# Patient Record
Sex: Male | Born: 1935 | Race: White | Hispanic: No | Smoking: Former smoker
Health system: Southern US, Community
[De-identification: ages and names within clinical notes are randomized; demographics above are authoritative.]

## PROBLEM LIST (undated history)

## (undated) DIAGNOSIS — J8410 Pulmonary fibrosis, unspecified: Secondary | ICD-10-CM

## (undated) DIAGNOSIS — I219 Acute myocardial infarction, unspecified: Secondary | ICD-10-CM

## (undated) DIAGNOSIS — I255 Ischemic cardiomyopathy: Secondary | ICD-10-CM

## (undated) DIAGNOSIS — K625 Hemorrhage of anus and rectum: Secondary | ICD-10-CM

## (undated) DIAGNOSIS — I10 Essential (primary) hypertension: Secondary | ICD-10-CM

## (undated) DIAGNOSIS — I509 Heart failure, unspecified: Secondary | ICD-10-CM

## (undated) DIAGNOSIS — Z8673 Personal history of transient ischemic attack (TIA), and cerebral infarction without residual deficits: Secondary | ICD-10-CM

## (undated) DIAGNOSIS — K449 Diaphragmatic hernia without obstruction or gangrene: Secondary | ICD-10-CM

## (undated) DIAGNOSIS — I2510 Atherosclerotic heart disease of native coronary artery without angina pectoris: Secondary | ICD-10-CM

## (undated) DIAGNOSIS — I714 Abdominal aortic aneurysm, without rupture, unspecified: Secondary | ICD-10-CM

## (undated) DIAGNOSIS — E785 Hyperlipidemia, unspecified: Secondary | ICD-10-CM

## (undated) DIAGNOSIS — Z794 Long term (current) use of insulin: Secondary | ICD-10-CM

## (undated) DIAGNOSIS — F4329 Adjustment disorder with other symptoms: Secondary | ICD-10-CM

## (undated) DIAGNOSIS — R0602 Shortness of breath: Secondary | ICD-10-CM

## (undated) DIAGNOSIS — Z95810 Presence of automatic (implantable) cardiac defibrillator: Secondary | ICD-10-CM

## (undated) DIAGNOSIS — E119 Type 2 diabetes mellitus without complications: Secondary | ICD-10-CM

## (undated) DIAGNOSIS — F4381 Prolonged grief disorder: Secondary | ICD-10-CM

## (undated) DIAGNOSIS — I5022 Chronic systolic (congestive) heart failure: Secondary | ICD-10-CM

## (undated) DIAGNOSIS — I6529 Occlusion and stenosis of unspecified carotid artery: Secondary | ICD-10-CM

## (undated) DIAGNOSIS — IMO0001 Reserved for inherently not codable concepts without codable children: Secondary | ICD-10-CM

## (undated) DIAGNOSIS — J449 Chronic obstructive pulmonary disease, unspecified: Secondary | ICD-10-CM

## (undated) DIAGNOSIS — I639 Cerebral infarction, unspecified: Secondary | ICD-10-CM

## (undated) HISTORY — DX: Type 2 diabetes mellitus without complications: E11.9

## (undated) HISTORY — DX: Ischemic cardiomyopathy: I25.5

## (undated) HISTORY — DX: Abdominal aortic aneurysm, without rupture: I71.4

## (undated) HISTORY — DX: Abdominal aortic aneurysm, without rupture, unspecified: I71.40

## (undated) HISTORY — DX: Hemorrhage of anus and rectum: K62.5

## (undated) HISTORY — DX: Prolonged grief disorder: F43.81

## (undated) HISTORY — DX: Atherosclerotic heart disease of native coronary artery without angina pectoris: I25.10

## (undated) HISTORY — DX: Diaphragmatic hernia without obstruction or gangrene: K44.9

## (undated) HISTORY — DX: Occlusion and stenosis of unspecified carotid artery: I65.29

## (undated) HISTORY — DX: Essential (primary) hypertension: I10

## (undated) HISTORY — DX: Pulmonary fibrosis, unspecified: J84.10

## (undated) HISTORY — DX: Presence of automatic (implantable) cardiac defibrillator: Z95.810

## (undated) HISTORY — DX: Reserved for inherently not codable concepts without codable children: IMO0001

## (undated) HISTORY — DX: Heart failure, unspecified: I50.9

## (undated) HISTORY — DX: Chronic obstructive pulmonary disease, unspecified: J44.9

## (undated) HISTORY — DX: Personal history of transient ischemic attack (TIA), and cerebral infarction without residual deficits: Z86.73

## (undated) HISTORY — DX: Chronic systolic (congestive) heart failure: I50.22

## (undated) HISTORY — DX: Hyperlipidemia, unspecified: E78.5

## (undated) HISTORY — DX: Adjustment disorder with other symptoms: F43.29

## (undated) HISTORY — DX: Long term (current) use of insulin: Z79.4

---

## 1990-10-01 HISTORY — PX: CORONARY ARTERY BYPASS GRAFT: SHX141

## 1995-10-02 HISTORY — PX: CORONARY ARTERY BYPASS GRAFT: SHX141

## 1996-10-01 HISTORY — PX: HERNIA REPAIR: SHX51

## 2001-03-20 ENCOUNTER — Inpatient Hospital Stay (HOSPITAL_COMMUNITY): Admission: RE | Admit: 2001-03-20 | Discharge: 2001-03-24 | Payer: Self-pay | Admitting: Cardiology

## 2005-10-01 HISTORY — PX: CORONARY ARTERY BYPASS GRAFT: SHX141

## 2005-10-01 HISTORY — PX: EXPLORATION POST OPERATIVE OPEN HEART: SHX5061

## 2005-10-23 ENCOUNTER — Encounter: Admission: RE | Admit: 2005-10-23 | Discharge: 2005-10-23 | Payer: Self-pay | Admitting: Cardiology

## 2005-10-31 ENCOUNTER — Inpatient Hospital Stay (HOSPITAL_BASED_OUTPATIENT_CLINIC_OR_DEPARTMENT_OTHER): Admission: RE | Admit: 2005-10-31 | Discharge: 2005-10-31 | Payer: Self-pay | Admitting: Cardiology

## 2005-10-31 ENCOUNTER — Inpatient Hospital Stay (HOSPITAL_COMMUNITY): Admission: RE | Admit: 2005-10-31 | Discharge: 2005-11-12 | Payer: Self-pay | Admitting: Cardiology

## 2005-10-31 ENCOUNTER — Ambulatory Visit: Payer: Self-pay | Admitting: Cardiology

## 2005-11-02 ENCOUNTER — Encounter: Payer: Self-pay | Admitting: Cardiology

## 2008-06-25 ENCOUNTER — Emergency Department (HOSPITAL_COMMUNITY): Admission: EM | Admit: 2008-06-25 | Discharge: 2008-06-25 | Payer: Self-pay | Admitting: Emergency Medicine

## 2008-07-01 ENCOUNTER — Ambulatory Visit: Payer: Self-pay | Admitting: Surgery

## 2008-09-28 ENCOUNTER — Inpatient Hospital Stay (HOSPITAL_COMMUNITY): Admission: AD | Admit: 2008-09-28 | Discharge: 2008-09-30 | Payer: Self-pay | Admitting: Cardiology

## 2008-09-28 ENCOUNTER — Encounter: Payer: Self-pay | Admitting: Internal Medicine

## 2008-09-29 ENCOUNTER — Encounter: Payer: Self-pay | Admitting: Cardiology

## 2008-10-01 DIAGNOSIS — Z95810 Presence of automatic (implantable) cardiac defibrillator: Secondary | ICD-10-CM

## 2008-10-01 HISTORY — DX: Presence of automatic (implantable) cardiac defibrillator: Z95.810

## 2008-12-13 ENCOUNTER — Encounter: Payer: Self-pay | Admitting: Internal Medicine

## 2008-12-13 ENCOUNTER — Encounter: Admission: RE | Admit: 2008-12-13 | Discharge: 2008-12-13 | Payer: Self-pay | Admitting: Cardiology

## 2008-12-22 ENCOUNTER — Encounter: Payer: Self-pay | Admitting: Internal Medicine

## 2009-01-07 ENCOUNTER — Inpatient Hospital Stay (HOSPITAL_BASED_OUTPATIENT_CLINIC_OR_DEPARTMENT_OTHER): Admission: RE | Admit: 2009-01-07 | Discharge: 2009-01-07 | Payer: Self-pay | Admitting: Cardiology

## 2009-01-07 HISTORY — PX: CARDIAC CATHETERIZATION: SHX172

## 2009-01-11 ENCOUNTER — Encounter: Payer: Self-pay | Admitting: Internal Medicine

## 2009-01-25 ENCOUNTER — Ambulatory Visit: Payer: Self-pay | Admitting: Internal Medicine

## 2009-01-25 DIAGNOSIS — I5032 Chronic diastolic (congestive) heart failure: Secondary | ICD-10-CM

## 2009-01-25 DIAGNOSIS — I6529 Occlusion and stenosis of unspecified carotid artery: Secondary | ICD-10-CM

## 2009-01-25 DIAGNOSIS — I2589 Other forms of chronic ischemic heart disease: Secondary | ICD-10-CM | POA: Insufficient documentation

## 2009-01-26 LAB — CONVERTED CEMR LAB
Basophils Absolute: 0 10*3/uL (ref 0.0–0.1)
CO2: 30 meq/L (ref 19–32)
Calcium: 9.2 mg/dL (ref 8.4–10.5)
Chloride: 109 meq/L (ref 96–112)
Eosinophils Absolute: 0.2 10*3/uL (ref 0.0–0.7)
Eosinophils Relative: 2.7 % (ref 0.0–5.0)
HCT: 42.8 % (ref 39.0–52.0)
Lymphocytes Relative: 16.1 % (ref 12.0–46.0)
Lymphs Abs: 1.2 10*3/uL (ref 0.7–4.0)
MCHC: 34.4 g/dL (ref 30.0–36.0)
Neutrophils Relative %: 70.3 % (ref 43.0–77.0)
Potassium: 4.2 meq/L (ref 3.5–5.1)
Prothrombin Time: 11.4 s (ref 10.9–13.3)
RDW: 13.8 % (ref 11.5–14.6)

## 2009-01-27 ENCOUNTER — Telehealth: Payer: Self-pay | Admitting: Internal Medicine

## 2009-01-29 HISTORY — PX: INSERT / REPLACE / REMOVE PACEMAKER: SUR710

## 2009-01-31 ENCOUNTER — Ambulatory Visit: Payer: Self-pay | Admitting: Internal Medicine

## 2009-01-31 ENCOUNTER — Inpatient Hospital Stay (HOSPITAL_COMMUNITY): Admission: RE | Admit: 2009-01-31 | Discharge: 2009-02-01 | Payer: Self-pay | Admitting: Internal Medicine

## 2009-02-01 ENCOUNTER — Encounter: Payer: Self-pay | Admitting: Internal Medicine

## 2009-02-17 ENCOUNTER — Ambulatory Visit: Payer: Self-pay | Admitting: *Deleted

## 2009-03-14 ENCOUNTER — Encounter: Admission: RE | Admit: 2009-03-14 | Discharge: 2009-03-14 | Payer: Self-pay | Admitting: Cardiology

## 2009-04-26 ENCOUNTER — Encounter: Payer: Self-pay | Admitting: Internal Medicine

## 2009-08-19 ENCOUNTER — Ambulatory Visit: Payer: Self-pay | Admitting: Vascular Surgery

## 2010-04-21 ENCOUNTER — Ambulatory Visit: Payer: Self-pay | Admitting: Vascular Surgery

## 2010-06-02 ENCOUNTER — Ambulatory Visit: Payer: Self-pay | Admitting: Cardiology

## 2010-08-22 ENCOUNTER — Ambulatory Visit: Payer: Self-pay | Admitting: Vascular Surgery

## 2010-08-31 ENCOUNTER — Ambulatory Visit: Payer: Self-pay | Admitting: Cardiology

## 2010-10-09 ENCOUNTER — Ambulatory Visit
Admission: RE | Admit: 2010-10-09 | Discharge: 2010-10-09 | Payer: Self-pay | Source: Home / Self Care | Attending: Internal Medicine | Admitting: Internal Medicine

## 2010-10-09 ENCOUNTER — Encounter: Payer: Self-pay | Admitting: Internal Medicine

## 2010-10-09 DIAGNOSIS — Z95810 Presence of automatic (implantable) cardiac defibrillator: Secondary | ICD-10-CM | POA: Insufficient documentation

## 2010-10-22 ENCOUNTER — Encounter: Payer: Self-pay | Admitting: Cardiothoracic Surgery

## 2010-11-02 NOTE — Cardiovascular Report (Signed)
Summary: Office Visit   Office Visit   Imported By: Roderic Ovens 10/23/2010 16:20:01  _____________________________________________________________________  External Attachment:    Type:   Image     Comment:   External Document

## 2010-11-02 NOTE — Assessment & Plan Note (Signed)
Summary: icd check/medtronic   Visit Type:  ICD MEDTRONIC Referring Provider:  Dr.Tennant Primary Provider:  Dr.Kinlaw    CC:  Shortness of breath-better.  History of Present Illness: [name] is seenin followup for ICD implantation in the setting of ischemic heart disease and prior bypass surgery depressed left ventricular function with ejection fraction of 25%.   His exercise intolerance has improved slowly. He is now walking 3 miles though slowly. He does not have nocturnal dyspnea or orthopnea. Does have occasional pedal edema.  He's had no palpitations and no fainting.  His also suffered an intercurrent stroke for which there is very little residual. This occurred in the same six-month interval.  His wife died earlier this summer and this was her first missed Christmas which was quite hard  Problems Prior to Update: 1)  Carotid Occlusive Disease  (ICD-433.10) 2)  Systolic Heart Failure, Chronic  (ICD-428.22) 3)  Cardiomyopathy, Ischemic  (ICD-414.8)  Current Medications (verified): 1)  Lipitor 40 Mg Tabs (Atorvastatin Calcium) .... Take One Tablet By Mouth Daily. 2)  Nexium 40 Mg Cpdr (Esomeprazole Magnesium) .Marland Kitchen.. 1 By Mouth Once Daily 3)  Zetia 10 Mg Tabs (Ezetimibe) .... Take One Tablet By Mouth Daily. 4)  Isosorbide Mononitrate Cr 30 Mg Xr24h-Tab (Isosorbide Mononitrate) .... Take One Tablet By Mouth Daily 5)  Fluoxetine Hcl 20 Mg Caps (Fluoxetine Hcl) .Marland Kitchen.. 1 By Mouth Once Daily 6)  Plavix 75 Mg Tabs (Clopidogrel Bisulfate) .... Take One Tablet By Mouth Daily 7)  Furosemide 40 Mg Tabs (Furosemide) .... Take One Tablet By Mouth Daily. 8)  Bystolic 10 Mg Tabs (Nebivolol Hcl) .Marland Kitchen.. 1 Tab Once Daily 9)  Multivitamins   Tabs (Multiple Vitamin) .Marland Kitchen.. 1 By Mouth Once Daily 10)  Plavix 75 Mg Tabs (Clopidogrel Bisulfate) .... Take One Tablet By Mouth Daily 11)  Losartan Potassium 50 Mg Tabs (Losartan Potassium) .... Once Daily  Allergies (verified): 1)  ! Sulfa 2)  ! * No Ivp  Dye Allergy 3)  ! * No Shellfish Allergy 4)  ! * No Latex Allergy  Past History:  Past Medical History: Last updated: 01/25/2009 coronary artery disease Prior stroke Hiatal hernia Sexual dysfunction Allergic rhinitis Depression Skin cancers Abdominal aortic aneurysm  Past Surgical History: Last updated: 01/25/2009 abdominal hernia repair Bypass surgery repeated x3  Family History: Last updated: 01/25/2009  Family History of Coronary Artery Disease:  Family History of CVA or Stroke:   Social History: Last updated: 01/25/2009 Retired  Married  Tobacco Use - No.  Alcohol Use - no  Risk Factors: Smoking Status: never (01/25/2009)  Vital Signs:  Patient profile:   75 year old male Height:      64 inches Weight:      189 pounds BMI:     32.56 Pulse rate:   70 / minute BP sitting:   98 / 66  (right arm)  Vitals Entered By: Caralee Ates CMA (October 09, 2010 8:48 AM)  Physical Exam  General:  The patient was alert and oriented in no acute distress. HEENT Normal.  Neck veins were flat, carotids were brisk.  Lungs were clear.  Heart sounds were regular without murmurs or gallops.  Abdomen was soft with active bowel sounds. There is no clubbing cyanosis or edema. Skin Warm and dry     ICD Specifications Following MD:  Inactive     ICD Vendor:  Medtronic     ICD Model Number:  [id number]     ICD Serial Number:  [id number] H ICD  DOI:  01/31/2009     ICD Implanting MD:  Sherryl Manges, MD  Lead 1:    Location: RA     DOI: 01/31/2009     Model #: 1308     Serial #: [id number]     Status: active Lead 2:    Location: RV     DOI: 01/31/2009     Model #: [id number]     Serial #: [id number]     Status: active  Indications::  CM  Explantation Comments: Dr Deborah Chalk following ICD  ICD Follow Up Remote Check?  No Battery Voltage:  3.14 V     Charge Time:  9.2 seconds     Underlying rhythm:  SR ICD Dependent:  No       ICD Device Measurements Atrium:  Amplitude: 3.0 mV,  Impedance: 561 ohms, Threshold: 0.75 V at 0.4 msec Right Ventricle:  Amplitude: 20 mV, Impedance: 646 ohms, Threshold: 1.0 V at 0.4 msec Shock Impedance: 49/63 ohms   Episodes MS Episodes:  0     Percent Mode Switch:  0     Coumadin:  No Shock:  0     ATP:  0     Nonsustained:  1     Atrial Pacing:  93.1%     Ventricular Pacing:  0.2%  Brady Parameters Mode DDDR+     Lower Rate Limit:  70     Upper Rate Limit 130 PAV 300     Sensed AV Delay:  300  Tachy Zones VF:  200     VT1:  176     Atrial Zones:  171 Next Remote Date:  01/11/2011     Next Cardiology Appt Due:  10/02/2011 Tech Comments:  Atrial sensitivity reprogrammed 0.57mV for FFRW.  Lead impedance alert values reprogrammed.   NSVT episode shows 1:1 conduction.  Optivol and thoracic impedance normal.  Carelink transmissions every 3 months.  ROV 1 year with [name]. Altha Harm, LPN  October 09, 2010 9:10 AM   Impression & Recommendations:  Problem # 1:  CARDIOMYOPATHY, ISCHEMIC (ICD-414.8) stable  Problem # 2:  IMPLANTATION OF DEFIBRILLATOR, HX OF (ICD-V45.02) Device parameters and data were reviewed and no changes were made  Problem # 3:  SYSTOLIC HEART FAILURE, CHRONIC (ICD-428.22) stable The following medications were removed from the medication list:    Diovan 160 Mg Tabs (Valsartan) .Marland Kitchen... Take one tablet by mouth daily His updated medication list for this problem includes:    Isosorbide Mononitrate Cr 30 Mg Xr24h-tab (Isosorbide mononitrate) .Marland Kitchen... Take one tablet by mouth daily    Plavix 75 Mg Tabs (Clopidogrel bisulfate) .Marland Kitchen... Take one tablet by mouth daily    Furosemide 40 Mg Tabs (Furosemide) .Marland Kitchen... Take one tablet by mouth daily.    Bystolic 10 Mg Tabs (Nebivolol hcl) .Marland Kitchen... 1 tab once daily    Losartan Potassium 50 Mg Tabs (Losartan potassium) ..... Once daily  Appended Document: icd check/medtronic    Clinical Lists Changes  Observations: Added new observation of PI CARDIO: Your physician recommends that  you continue on your current medications as directed. Please refer to the Current Medication list given to you today. Your physician wants you to follow-up in: 1 year  You will receive a reminder letter in the mail two months in advance. If you don't receive a letter, please call our office to schedule the follow-up appointment. (10/09/2010 10:31)       Patient Instructions: 1)  Your physician recommends that you continue on  your current medications as directed. Please refer to the Current Medication list given to you today. 2)  Your physician wants you to follow-up in: 1 year  You will receive a reminder letter in the mail two months in advance. If you don't receive a letter, please call our office to schedule the follow-up appointment.

## 2010-11-30 ENCOUNTER — Ambulatory Visit (INDEPENDENT_AMBULATORY_CARE_PROVIDER_SITE_OTHER): Payer: Medicare Other | Admitting: Cardiology

## 2010-11-30 DIAGNOSIS — I2589 Other forms of chronic ischemic heart disease: Secondary | ICD-10-CM

## 2010-11-30 DIAGNOSIS — I5021 Acute systolic (congestive) heart failure: Secondary | ICD-10-CM

## 2011-01-11 ENCOUNTER — Encounter: Payer: Self-pay | Admitting: *Deleted

## 2011-01-16 ENCOUNTER — Encounter: Payer: Self-pay | Admitting: *Deleted

## 2011-01-18 ENCOUNTER — Ambulatory Visit (INDEPENDENT_AMBULATORY_CARE_PROVIDER_SITE_OTHER): Payer: Medicare Other | Admitting: *Deleted

## 2011-01-18 ENCOUNTER — Telehealth: Payer: Self-pay | Admitting: Internal Medicine

## 2011-01-18 ENCOUNTER — Other Ambulatory Visit: Payer: Self-pay

## 2011-01-18 DIAGNOSIS — I428 Other cardiomyopathies: Secondary | ICD-10-CM

## 2011-01-18 DIAGNOSIS — I5022 Chronic systolic (congestive) heart failure: Secondary | ICD-10-CM

## 2011-01-18 NOTE — Telephone Encounter (Signed)
Left message for patient, transmission recieved

## 2011-01-26 NOTE — Progress Notes (Signed)
icd remote w/icm  

## 2011-01-30 ENCOUNTER — Encounter: Payer: Self-pay | Admitting: *Deleted

## 2011-02-13 NOTE — Assessment & Plan Note (Signed)
OFFICE VISIT   JARIS, KOHLES  DOB:  [birth year]                                       08/22/2010  ZOXWR#:[id number]   [name] presents today for follow-up of his extracranial  cerebrovascular occlusive disease.  He did have a history of mild stroke  in the past and has had long-term follow-up in our office with carotid  duplexes.  These have always showed mild narrowing, 20-39% bilaterally.  He is also followed for a 4.8-cm aneurysm.  Most recent imaging of this  was in July 2011.  He reports that his wife of 51 years died 6 months  ago and he is having difficulty adjusting to this.  He reports he has  lost 26 pounds.  He denies any new symptoms of neurologic deficits,  specifically any amaurosis fugax, transient ischemic attack or stroke.  He has no new cardiac difficulty.  The past history and review of  systems are otherwise unchanged from his July visit.   PHYSICAL EXAMINATION:  Well-developed, well-nourished white male  appearing his stated age of 39 in no acute stress.  Blood pressure is 135/93, pulse 83, respirations 18, his oxygen  saturations are 98%.  HEENT:  Normal.  CHEST:  Clear bilaterally without rales, rhonchi or wheezes.  HEART:  Regular rate and rhythm without murmur.  He does have normal  carotid pulsation with no bruits bilaterally.  His radial and dorsalis  pedis pulses are 2+ bilaterally with no evidence of peripheral  aneurysms.  ABDOMEN:  Exam reveals moderate obesity and no tenderness with a  prominent aortic pulsation.  MUSCULOSKELETAL:  Shows no major deformities or cyanosis.  NEUROLOGIC:  No focal weakness or paresthesias.  SKIN:  Without ulcers or rashes.   He underwent repeat carotid duplex today in our office which I have  ordered and independently reviewed.  This shows no change in his 20-39%  narrowing bilaterally.  I discussed this at length with Mr. Tavella and  recommended discontinuation of follow-up  unless he were to develop any  new neurologic deficits.  I do plan to see him on a 75-month basis and  continue serial ultrasound follow-up of his aneurysm.  He understands  symptoms of aneurysm leakage and knows to report immediately to the  emergency room should this occur.  He will be seen in our vascular lab  in January 2012 for 19-month follow-up.     Larina Earthly, M.D.  Electronically Signed   TFE/MEDQ  D:  08/22/2010  T:  08/23/2010  Job:  [id number]

## 2011-02-13 NOTE — Procedures (Signed)
DUPLEX ULTRASOUND OF ABDOMINAL AORTA   INDICATION:  Pulsatile mass in abdomen.   HISTORY:  Diabetes:  No.  Cardiac:  Coronary artery bypass graft x4 on three separate occasions.  Hypertension:  Yes.  Smoking:  Former smoker.  Connective Tissue Disorder:  Family History:  Previous Surgery:   DUPLEX EXAM:         AP (cm)                   TRANSVERSE (cm)  Proximal             3.6 cm                    3.4 cm  Mid                  4.0 cm                    4.0 cm  Distal               1.8 cm                    2.6 cm  Right Iliac          0.9 cm                    0.9 cm  Left Iliac           1.1 cm                    0.9 cm   PREVIOUS:  Date:  AP:  TRANSVERSE:   IMPRESSION:  Abdominal aortic aneurysm is identified, measuring 4 cm in  diameter.   ___________________________________________  V. Charlena Cross, MD   MC/MEDQ  D:  07/01/2008  T:  07/01/2008  Job:  [id number]

## 2011-02-13 NOTE — Discharge Summary (Signed)
NAMESHIHEEM, CORPORAN NO.:  [id number]   MEDICAL RECORD NO.:  [id number]          PATIENT TYPE:  INP   LOCATION:  3736                         FACILITY:  [facility]   PHYSICIAN:  Elmore Guise., M.D.DATE OF BIRTH:  [birth year]   DATE OF ADMISSION:  09/28/2008  DATE OF DISCHARGE:  09/30/2008                               DISCHARGE SUMMARY   DISCHARGE DIAGNOSES:  1. Small acute cerebrovascular accident in the posterior right corona      radiata.  2. History of coronary artery disease.  3. Dyslipidemia.  4. Peripheral vascular disease (moderate disease in right carotid      artery).   HISTORY OF PRESENT ILLNESS:  Mr. Canal is a very pleasant 75 year old  white male with past medical history of coronary artery disease,  dyslipidemia, and hypertension, who presented to the office with left-  sided weakness, primarily in his hand.  He also had left-sided facial  droop.  He was admitted for evaluation of a stroke.   HOSPITAL COURSE:  The patient's hospital course was uncomplicated.  His  weakness in his left side improved.  By the day of discharge, his  strength was back to normal.  His facial droop had resolved.  He had  no tongue deviation any further.  His workup included an MRA of the head  and neck which showed an acute posterior right corona radiata infarct.  His right carotid artery had a 50% diameter stenosis.  He also had  carotid duplex which showed a 60-80% narrowing in his right carotid  artery.  His blood pressure has been well controlled.  His lipid showed  a low HDL with an HDL of 35.  Because of this, he will be started on  Niaspan.  He has now been up and ambulatory with no further problems.  His aspirin was changed to Plavix per Neurology recommendations.  His  followup appointment will be with Dr. Deborah Chalk at [facility]  in 2 weeks and with Dr. Pearlean Brownie at [facility] in 4  weeks.  He will have a low-sodium diet.  He  can increase his activity as  tolerated.   DISCHARGE MEDICATIONS:  1. Lipitor 40 mg daily.  2. Nexium 40 mg daily.  3. Zetia 10 mg daily.  4. Imdur 30 mg daily.  5. Prozac 20 mg daily.  6. Multivitamin once daily.  7. ProAir inhaler as needed.  8. Lasix 20 mg daily.  9. Diovan 80 mg daily.  10.Bystolic 5 mg daily (these are all as before).   NEW MEDICATIONS:  1. Plavix 75 mg daily.  2. Niaspan 500 mg daily (he is to come by the office for samples of      his Niaspan.  I did discuss the side effects from this medication      with him at length).  Because of Neurology recommendations to stop      his aspirin, this will be discontinued at this time.  All of his      questions were answered prior to discharge.      Elmore Guise., M.D.  Electronically Signed     TWK/MEDQ  D:  09/30/2008  T:  10/01/2008  Job:  [id number]   cc:   Colleen Can. Deborah Chalk, M.D.

## 2011-02-13 NOTE — Assessment & Plan Note (Signed)
OFFICE VISIT   LASALLE, ABEE  DOB:  Dec 05, 1935                                       04/21/2010  ZOXWR#:[id number]   The patient presents today for followup of his infrarenal abdominal  aortic aneurysm.  He has no symptoms referable to this.  He has been  followed with serial ultrasounds in our office.  He also had an episode  in the past of a TIA and has had serial carotid duplexes and is due for  a repeat in November.  His duplex had revealed mild carotid disease  bilaterally and I had explained that if this shows no significant change  in November we will discontinue followup of this.  He has been seen by  Dr. Liliane Bade in the past and I explained to the patient that Dr. Madilyn Fireman  had left our practice.  He has no symptoms referable to his aneurysm and  has had no new cardiac difficulty other than having a pacemaker  defibrillator placed.   Review of systems is otherwise unchanged.   I did review his medication list with the patient as well.   He has had coronary artery bypass grafting 3 times in the past.   PHYSICAL EXAMINATION:  General:  A well-developed, well-nourished white  male appearing stated age.  Vital signs:  Blood pressure is 104/70,  pulse 82, respirations 18.  He is in no acute distress.  HEENT:  Normal.  Chest:  Clear bilaterally.  Neck:  Carotid arteries are without bruits  bilaterally.  His right radial pulse is 2+.  He has had left radial  artery harvest.  Abdomen:  Soft, nontender.  I do not palpate an  aneurysm.  He does not have any other masses.  Musculoskeletal:  Shows  no major deformities or cyanosis.  Neurological:  No focal weakness or  paresthesias.  Skin:  Without ulcers or rashes.   He underwent repeat duplex and I have reviewed this with the patient and  this shows no change maximum with maximal diameter 4.8 cm aneurysm.  He  understands this and we will see him in November for a carotid duplex  and continue with  his aneurysm protocol followup as well.     Larina Earthly, M.D.  Electronically Signed   TFE/MEDQ  D:  04/21/2010  T:  04/21/2010  Job:  [id number]   cc:   Colleen Can. Deborah Chalk, M.D.

## 2011-02-13 NOTE — Consult Note (Signed)
[name], [name] NO.:  [id number]   MEDICAL RECORD NO.:  [id number]          PATIENT TYPE:  INP   LOCATION:  3736                         FACILITY:  [facility]   PHYSICIAN:  Marlan Palau, M.D.  DATE OF BIRTH:  1936-01-30   DATE OF CONSULTATION:  DATE OF DISCHARGE:                                 CONSULTATION   CHIEF COMPLAINT:  Left-sided weakness.   HISTORY OF PRESENT ILLNESS:  This is a pleasant 75 year old white male  with past medical history of hypercholesterolemia, congestive heart  failure with an ejection fraction of 45%, ventral hernia repair,  abdominal aortic aneurysm 4 cm, and coronary artery disease.  The  patient woke on September 27, 2008, drove to the store, on his way home  he realized that he was dropping the bag of groceries in his left hand  multiple times.  Patient did not think anything of this; however, when  he returned home, the patient's wife noticed that the left corner of  patient's mouth was drooping, and he was slurring his speech.  At that  time, his wife had asked him to go to see a physician.  However, patient  did not want to go to the medical doctor.  Upon awaking on September 28, 2008, patient's left facial droop and slurred speech had not resolved.  However, his left-sided weakness had resolved.  In accordance to his  wife, patient did go to the physician who did an echocardiogram and  admitted him to [facility] and consulted Neurology at that  time.  At the time of interview, patient was not complaining of any  dysphasia, ataxia, numbness, or apraxia.  Patient was completely alert  and cooperative.  He himself did not realize he was slurring the speech,  however, his wife stated that his speech has not changed since the  initial onset.  Patient is comfortable with no chest pain, shortness of  breath, or abdominal pain.   PAST MEDICAL HISTORY:  As stated,  1. High cholesterol.  2. Sleep apnea.  3. CAD.  4.  CHF.  5. Abdominal aortic aneurysm in September 2009.  6. CABG x3.  7. GERD.  8. Ventral hernia repair in 1998.   MEDICATIONS:  1. Aspirin 325 daily.  2. Lipitor 40 mg daily.  3. Nexium 40 mg daily.  4. Zetia 10 mg daily.  5. Prozac 20 mg daily.  6. Imdur 30 mg daily.   ALLERGIES:  None.   SOCIAL HISTORY:  The patient quit smoking in 1986.  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   PHYSICAL EXAMINATION:  GENERAL:  This is a pleasant 75 year old male.  NEUROLOGIC:  Mental Status:  He is alert and oriented x3.  Carries out 2  and 3-step commands without any problems.  He is well enunciated and  conversive, appropriate demeanor.  Cranial Nerves:  Pupils are equal,  round, and reactive to light and accommodation and conjugate.  Extraocular muscles are intact, no ptosis.  Visual fields are intact.  Tongue is midline.  Face has a left corner of  the mouth and nasolabial  facial droop.  Uvula was midline.  Slightly dysarthric, sensation  deficit is negative through V1 through V3.  Shoulder shrug and head turn  is full.  Coordination finger-to-nose is smooth.  Heel-to-shin smooth  finger.  Fine motor movement is normal.  Motor is 5/5 throughout with  the exception of left triceps extension and left shoulder abduction,  which is 4/5.  Grips are equal bilaterally.  Deep tendon reflexes are 2+  throughout, down going toes bilaterally.  Gait is smooth with narrow  base.  Patient does have a positive left upper extremity drift, which  scores a 1 on the NIH stroke scale.  Sensation is full throughout with  pinprick, light touch, and vibration.  PULMONARY:  Clear to auscultation bilaterally.  CARDIOVASCULAR:  Irregular rate secondary to PVCs.  S1, S2 is audible.  NECK:  Negative for bruits and supple.   LABORATORY DATA:  Labs are pending at this time.  Imaging and test  pending at this time.  However, patient did score a 3 on the NIH stroke  scale.   ASSESSMENT:  At this time,  75 year old male with significant  cardiovascular medical history with a new onset of left facial droop,  left hand weakness, and slurred speech.  Most likely, this is a right  parietal CVA secondary to hypertension or small-vessel occlusion.   TREATMENT AND PLAN:  We will obtain MRI and MRA of intracranial vessels,  MRA of extracranial vessels, fasting lipid, and glucose HbA1C, carotid  Doppler.  Follow up patient's results while in hospital.  I would also  recommend starting Plavix 75 mg daily as the patient has failed a 325 mg  of aspirin daily.     ______________________________  Felicie Morn, PA-C      C. Lesia Sago, M.D.  Electronically Signed    DS/MEDQ  D:  09/28/2008  T:  09/29/2008  Job:  [id number]
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
## 2011-02-13 NOTE — Procedures (Signed)
CAROTID DUPLEX EXAM   INDICATION:  History of CVA and known carotid artery disease.   HISTORY:  Diabetes:  No.  Cardiac:  Pacemaker.  Hypertension:  Yes.  Smoking:  Quit.  Previous Surgery:  No.  CV History:  CVA.  Amaurosis Fugax No, Paresthesias No, Hemiparesis No                                       RIGHT             LEFT  Brachial systolic pressure:         130               120  Brachial Doppler waveforms:         Normal            Normal  Vertebral direction of flow:        Antegrade         Antegrade  DUPLEX VELOCITIES (cm/sec)  CCA peak systolic                   51                56  ECA peak systolic                   86                70  ICA peak systolic                   93                46  ICA end diastolic                   37                17  PLAQUE MORPHOLOGY:                  Heterogeneous     Heterogeneous  PLAQUE AMOUNT:                      Mild              Mild  PLAQUE LOCATION:                    ICA, ECA          ICA, ECA   IMPRESSION:  1. 20%-39% stenosis noted in the bilateral internal carotid arteries.  2. Antegrade flow noted in bilateral vertebrals.         ___________________________________________  Larina Earthly, M.D.   CB/MEDQ  D:  08/19/2009  T:  08/19/2009  Job:  [id number]

## 2011-02-13 NOTE — Consult Note (Signed)
VASCULAR SURGERY CONSULTATION   [name], [name]  DOB:  1936/04/17                                       02/17/2009  ZOXWR#:[id number]   REFERRING PHYSICIAN:  [name], MD.   REFERRAL DIAGNOSIS:  Right hemispheric cerebrovascular accident.   HISTORY:  The patient is a 75 year old male who was admitted to [facility] in late December of 2009 following the acute onset of left-  sided weakness, left facial droop and slurred speech.  MRI revealed a  small acute posterior right corona radiata infarct.  Also mild to  moderate small vessel disease was noted.  He has recovered well from  this.   Workup for this including a carotid Doppler carried out in December of  2009 revealed a 60-79% right mid internal artery stenosis associated  with vessel tortuosity.  An MRI of the head revealed mild branch vessel  atherosclerotic changes.  MRA of the neck revealed approximately 50%  proximal right internal carotid artery stenosis.   The patient was seen specifically in relation to a second opinion  regarding further management including surgical options.   PAST MEDICAL HISTORY:  1. Coronary artery disease status post coronary artery bypass 1993,      1997, 2007.  ICD insertion 02/08/2009 by Dr. Sherryl Manges.  2. CVA December 2009.  3. Ischemic cardiomyopathy.  4. Chronic LV systolic failure.  5. Small abdominal aortic aneurysm (4 cm).   MEDICATIONS:  1. Lipitor 40 mg daily.  2. Nexium 40 mg daily.  3. Zetia 10 mg daily.  4. Isosorbide 30 mg daily.  5. Fluoxetine HCl 20 mg daily.  6. Plavix 75 mg daily.  7. Diovan 160 mg daily.  8. Bystolic 10 mg daily.  9. Furosemide 40 mg daily.  10.Multivitamin 1 tablet daily.   ALLERGIES:  SULFA.   SOCIAL HISTORY:  The patient is married with two children.  He is  retired.  Does not currently smoke.  Discontinued tobacco use in 1997.  No regular alcohol use.   FAMILY HISTORY:  Mother died age 54 from the  complications of a stroke.  Father died age 35 also from complications of a stroke.  He has one  deceased brother who died of an MI at age 80.   REVIEW OF SYSTEMS:  Refer to patient encounter form.  The patient has no  major complaints related to 12 system review.   PHYSICAL EXAMINATION:  General:  An alert, oriented 75 year old male.  No acute distress.  Vital signs:  BP is 105/73 right arm, 113/81 left  arm, pulse is 80 per minute.  HEENT:  Unremarkable.  Neck:  Supple.  No  thyromegaly or adenopathy.  Chest:  Equal air entry bilaterally without  rales or rhonchi.  Cardiovascular:  No carotid bruits.  Normal heart  sounds without murmurs.  No gallops or rubs.  Regular rate and rhythm.  Abdomen:  Soft, nontender.  No masses or organomegaly.  Normal bowel  sounds without bruits.  Extremities:  No ankle edema.  Neurological:  Cranial nerves intact.  Strength equal bilaterally.  1+ reflexes.  Normal gait.   INVESTIGATIONS:  Repeat carotid Doppler reveals evidence of minimal  bilateral internal carotid artery stenosis at 20-39%.  Antegrade  vertebral flow present bilaterally.  Mild carotid bifurcation plaque  present bilaterally.   IMPRESSION:  1. Nondisabling right  hemispheric cerebrovascular accident associated      with mild bilateral carotid stenosis and plaque.  2. Ischemic cardiomyopathy.  3. Status post AICD (automatic implantable cardioverter defibrillator)      insertion.  4. Chronic LV (left ventricle) systolic failure.  5. Small abdominal aortic aneurysm.   RECOMMENDATIONS:  The patient has evidence of minimal bilateral carotid  disease as evidenced by mild plaque and minimal stenosis.  Would not  recommend any further workup at this time nor intervention.  Continue  antiplatelet agents.  Will plan followup in 6 months with a surveillance  carotid Doppler.   Balinda Quails, M.D.  Electronically Signed  PGH/MEDQ  D:  02/17/2009  T:  02/18/2009  Job:  [id number]   cc:    Colleen Can. Deborah Chalk, M.D.

## 2011-02-13 NOTE — Procedures (Signed)
CAROTID DUPLEX EXAM   INDICATION:  History of CVA and known carotid artery disease.   HISTORY:  Diabetes:  No.  Cardiac:  Pacemaker.  Hypertension:  Yes.  Smoking:  Quit.  Previous Surgery:  No.  CV History:  CVA.  Amaurosis Fugax No, Paresthesias No, Hemiparesis No                                       RIGHT             LEFT  Brachial systolic pressure:         127               125  Brachial Doppler waveforms:         Normal            Normal  Vertebral direction of flow:        Antegrade         Antegrade  DUPLEX VELOCITIES (cm/sec)  CCA peak systolic                   59                62  ECA peak systolic                   104               60  ICA peak systolic                   93                83  ICA end diastolic                   39                33  PLAQUE MORPHOLOGY:                  Heterogeneous     Calcific  PLAQUE AMOUNT:                      Mild              Mild  PLAQUE LOCATION:                    ICA, ECA          ICA, ECA   IMPRESSION:  1. 20%-39% stenosis noted in bilateral internal carotid arteries.  2. Left internal carotid artery velocities have increased slightly      from previous study, although the category of stenosis has remained      unchanged.   ___________________________________________  Larina Earthly, M.D.   EM/MEDQ  D:  08/22/2010  T:  08/22/2010  Job:  [id number]

## 2011-02-13 NOTE — Procedures (Signed)
CAROTID DUPLEX EXAM   INDICATION:  History of CVA and known carotid artery disease.   HISTORY:  Diabetes:  No.  Cardiac:  Pacemaker.  Hypertension:  Yes.  Smoking:  Quit.  Previous Surgery:  None.  CV History:  History of CVA.  Amaurosis Fugax No, Paresthesias No, Hemiparesis No.                                       RIGHT             LEFT  Brachial systolic pressure:         113               105  Brachial Doppler waveforms:  Vertebral direction of flow:        Antegrade         Antegrade  DUPLEX VELOCITIES (cm/sec)  CCA peak systolic                   42                52  ECA peak systolic                   110               75  ICA peak systolic                   100               72  ICA end diastolic                   32                30  PLAQUE MORPHOLOGY:                  Heterogenous      Heterogenous  PLAQUE AMOUNT:                      Mild              Mild  PLAQUE LOCATION:                    ICA, ECA          ICA, ECA   IMPRESSION:  1. 20-39% stenosis noted in bilateral internal carotid arteries.  2. Antegrade bilateral vertebral arteries.       ___________________________________________  P. Liliane Bade, M.D.   MG/MEDQ  D:  02/17/2009  T:  02/17/2009  Job:  [id number]

## 2011-02-13 NOTE — Procedures (Signed)
DUPLEX ULTRASOUND OF ABDOMINAL AORTA   INDICATION:  Follow up abdominal aortic aneurysm.   HISTORY:  Diabetes:  No.  Cardiac:  Pacemaker.  Hypertension:  Yes.  Smoking:  Quit.  Connective Tissue Disorder:  Family History:  No.  Previous Surgery:  No.   DUPLEX EXAM:         AP (cm)                   TRANSVERSE (cm)  Proximal             3.75 cm                   4.26 cm  Mid                  4.49 cm                   4.78 cm  Distal               3.16 cm                   2.99 cm  Right Iliac          1.30 cm                   1.48 cm  Left Iliac           0.97 cm                   1.52 cm   PREVIOUS:  Date:  AP:  4.47  TRANSVERSE:  4.82   IMPRESSION:  1. Abdominal aortic aneurysm noted with largest measurement of 4.49 cm      X 4.78 cm.  2. No elevated velocities noted in the aorta and common iliac.   ___________________________________________  Larina Earthly, M.D.   CB/MEDQ  D:  04/21/2010  T:  04/21/2010  Job:  [id number]

## 2011-02-13 NOTE — Consult Note (Signed)
NAMEDEMONTAE, ANTUNES NO.:  [id number]   MEDICAL RECORD NO.:  [id number]          PATIENT TYPE:  EMS   LOCATION:  MAJO                         FACILITY:  [facility]   PHYSICIAN:  Vesta Mixer, M.D. DATE OF BIRTH:  03/29/1936   DATE OF CONSULTATION:  06/25/2008  DATE OF DISCHARGE:  06/25/2008                                 CONSULTATION   Lyon Busic is a 75 year old gentleman with a history of coronary  artery disease.  He is status post coronary bypass grafting x3.  He  presents to the [facility] Room via his medical doctor in  [place] for cough and congestion and shortness of breath for the past  week.   Mr. Sehgal has a long history of coronary artery disease.  He has had 3  separate coronary artery bypass grafting surgeries.  He was last seen by  Dr. Deborah Chalk in April.  He has overall done fairly well.  He walks on a  regular basis.  For the past several weeks, he has noticed that he gets  short of breath with exertion.  He has also had a cough especially when  he lies down at night.  He went to the beach last week and ate a lot of  salty food.  His shortness of breath worsened during that time.  He has  continued to have cough especially at night when he lies down.  He is  completely comfortable while at rest and doing normal activities, but he  certainly gets shortness of breath and is limited when he tries to exert  himself.  He presented to his medical doctor today.  They recommend that  he to [facility] be seen by [place].   He has not had any episodes of angina.  He typically has angina during  times of his true coronary artery disease.  He denies any syncope or  presyncope.  He denies any real orthopnea.  He does have some cough when  lies down.   Current medications are:  1. Aspirin 325 mg a day.  2. Lipitor 40 mg a day.  3. Nexium 40 mg a day.  4. Zetia 10 mg a day.  5. Prozac 20 mg a day.  6. Imdur 30 mg a day.  7. Multivitamin once  a day.   ALLERGIES:  None.   PAST MEDICAL HISTORY:  1. Coronary artery disease - status post coronary bypass grafting.  2. Hyperlipidemia.  3. Gastroesophageal reflux disease.   SOCIAL HISTORY:  The patient used to smoke, but quit in 1986.   His family history is noncontributory.   Review of systems is as noted in HPI and is otherwise negative.   PHYSICAL EXAMINATION:  GENERAL:  He is an elderly gentleman in no acute  distress.  He is alert and oriented x3, actually appears to be  comfortable.  VITAL SIGNS:  His blood pressure is 155/89 with heart rate of 81.  HEENT:  2+ carotids.  He has no bruits.  No JVD.  No thyromegaly.  LUNGS:  Few wheezes.  HEART:  Regular rate, S1 and  S2.  ABDOMEN:  Good bowel sounds and is nontender.  EXTREMITIES:  He has no clubbing, cyanosis, or edema.   LABORATORY DATA:  His B-natriuretic peptide is 363.  His sodium is 141,  potassium is 4.2, chloride is 107, CO2 is 25, BUN is 12, creatinine is  1.24.  His white blood cell count 6.1.  His hemoglobin is 13.3.   His chest x-ray today reveals a mildly enlarged heart.  There is no  evidence of heart failure.  He does have some chronic scarring.  He has  some hyperinflation.   His initial point-of-care cardiac enzymes are negative.   Mr. Petite presents with some congestion and cough and some shortness  of breath with exertion.  He has an ejection fraction of around 41% by  Cardiolite scan several years ago.  I suspect that he has some mild  congestive heart failure due to dietary indiscretion.  He certainly  admits eating a lot of salt last week at the beach.  At this point, I  would like to start him on Lasix 40 mg a day as well as potassium 10 mEq  a day.  We will give him adenosine every morning, and I will see him  back in the office on Monday.  We will also get an echocardiogram.  We  will give him a refill for his nitroglycerin, and I will also give him  an inhaler since he is wheezing a  little bit.  He may have some chronic  lung disease.  I have told him to come back if he has any worsening  symptoms, but right now, he is entirely comfortable, and I do not think  that he needs acute hospitalization.  His blood pressure is a little bit  elevated today, which goes along with his excessive salt intake.  We  will check an echocardiogram as noted above.  He has been instructed to  come back to [facility] if he has any other problems.      Vesta Mixer, M.D.  Electronically Signed     PJN/MEDQ  D:  06/25/2008  T:  06/26/2008  Job:  [id number]   cc:   Colleen Can. Deborah Chalk, M.D.

## 2011-02-13 NOTE — Discharge Summary (Signed)
NAMETADEO, BESECKER NO.:  [id number]   MEDICAL RECORD NO.:  [id number]          PATIENT TYPE:  INP   LOCATION:  4729                         FACILITY:  [facility]   PHYSICIAN:  Duke Salvia, MD, FACCDATE OF BIRTH:  04/14/1936   DATE OF ADMISSION:  01/31/2009  DATE OF DISCHARGE:  02/01/2009                               DISCHARGE SUMMARY   PRIMARY CARDIOLOGIST:  Colleen Can. Deborah Chalk, MD   ELECTROPHYSIOLOGIST:  Duke Salvia, MD, [facility]   PRIMARY CARE PHYSICIAN:  Dr. Lawana Pai.   DISCHARGE DIAGNOSIS:  Ischemic cardiomyopathy status post successful  placement of a Medtronic Virtuoso II DR four chamber AICD Jan 31, 2009.   SECONDARY DIAGNOSES:  1. Coronary artery disease.  2. Carotid artery disease pending carotid endarterectomy.  3. Status post cerebrovascular accident.  4. Hypertension.  5. Hyperlipidemia.  6. Hiatal hernia.  7. Depression.  8. History of abdominal aortic aneurysm.   ALLERGIES:  SULFA.   PROCEDURES:  Successful placement of a Medtronic dual chamber AICD as  above.   HISTORY OF PRESENT ILLNESS:  A 75 year old Caucasian male with above  problem list.  He is followed by Dr. Deborah Chalk and has a history of CABG  with redo CABG x2.  He recently underwent cardiac catheterization  performed by Dr. Deborah Chalk on January 07, 2009 revealing medically manageable  coronary artery disease with reduced EF at 30%.  In the setting of  ischemic cardiomyopathy, he was referred to [name] for evaluation for  ICD placement.   HOSPITAL COURSE:  The patient presented to the EP Lab on Jan 31, 2009  where he underwent successful placement of a Medtronic Virtuoso dual  chamber AICD.  He tolerated this procedure well and postprocedure has  been ambulating without discomfort or limitations.  He has been  counseled on the importance of left arm/shoulder mobility restrictions  over the next week.  He has been noted to be hypertensive and as a  result we have titrated his  Bystolic to 10 mg daily.  We will otherwise  plan to continue his previous home medications and will be discharged  home today in good condition.   DISCHARGE LABORATORY DATA:  None.   DISPOSITION:  The patient is being discharged home today in good  condition.   FOLLOWUP PLANS AND APPOINTMENTS:  He will follow up with Dr. Deborah Chalk on  Feb 08, 2009.  He will follow up with Dr. Lawana Pai as previously  scheduled.  The patient will arrange followup with [name] following  his carotid endarterectomy which has not yet been scheduled.   DISCHARGE MEDICATIONS:  1. Bystolic 10 mg daily.  2. Multivitamin daily.  3. Diovan 160 mg daily.  4. Furosemide 40 mg daily.  5. Plavix 75 mg daily.  6. Fluoxetine 20 mg daily.  7. Isosorbide mononitrate 30 mg daily.  8. Zetia 10 mg daily.  9. Nexium 40 mg daily.  10.Lipitor 40 mg daily.   OUTSTANDING LABORATORY STUDIES:  None.   DURATION OF DISCHARGE ENCOUNTER:  35 minutes including physician time.      Nicolasa Ducking, ANP  Duke Salvia, MD, [facility]  Electronically Signed    CB/MEDQ  D:  02/01/2009  T:  02/02/2009  Job:  [id number]   cc:   Colleen Can. Deborah Chalk, M.D.  Dr. Lawana Pai

## 2011-02-13 NOTE — H&P (Signed)
NAMESUMMER, PARTHASARATHY NO.:  [id number]   MEDICAL RECORD NO.:  [id number]          PATIENT TYPE:  INP   LOCATION:  3736                         FACILITY:  [facility]   PHYSICIAN:  [name], M.D.DATE OF BIRTH:  1935/11/10   DATE OF ADMISSION:  09/28/2008  DATE OF DISCHARGE:                              HISTORY & PHYSICAL   REASON FOR ADMISSION:  Stroke of 24 hours duration.   HISTORY:  [name] is a 75 year old white male with a onset of left  facial droop, slurred speech, and left hand weakness yesterday at noon  approximately 24 hours ago.  It was a painless onset.  There is no  lightheadedness.  No dizziness.  He had no chest pain, shortness of  breath, diaphoresis, or arrhythmia.  He is able to function.  He drove  to the office today.  Yesterday, when this was going on, he went to get  lunch and noted that he dropped the bag that he was carrying home at  least 3 different times.  He refused to come to the hospital yesterday,  but his wife insisted when he woke up with same symptoms today.   PAST MEDICAL HISTORY:  He has had coronary artery bypass grafting in  1992, 1997, and again in 2007.  He had a previous inferolateral  myocardial infarction.  He has had a history of congestive heart failure  and had an ejection fraction of approximately 45%.  He has a history of  hypertension, hypercholesterolemia, ventral hernia repair in 1998 by Dr.  Derrell Lolling, history of abdominal aortic aneurysm noted in September 2009  that is 4 cm in diameter, history of sleep apnea, and recent bronchitis  in the past month.   ALLERGIES:  SULFA.   CURRENT MEDICATIONS:  1. Aspirin 325 mg a day.  2. Lipitor 40 mg a day.  3. Nexium 40 mg a day.  4. Zetia 10 mg a day.  5. Prozac 20 mg a day.  6. Imdur 30 mg a day.  7. Multivitamin.  8. ProAir p.r.n.  9. Furosemide 20 mg a day.  10.Diovan 80 mg a day.  11.Bystolic 5 mg daily.   SOCIAL HISTORY:  He is retired Development worker, international aid.  He is married.  There is  no tobacco, no alcohol.   FAMILY HISTORY:  Positive with father dying at age 13 with CVA.   REVIEW OF SYSTEMS:  He has had a history of gallstone.  About 4 months  ago, he fell forward and hit his forehead on the concrete with that  fall.  He has not had residual symptoms since that time.  He has had  some generalized weakness and some obesity.  He did have heart failure  back in September 2009 due to dietary indiscretions.  He has a history  of chronic left ventricular ectopy.  He has had no recent complaints of  lower extremity edema.  There have been on GI complaints in particular.  GU:  Negative.   PHYSICAL EXAMINATION:  GENERAL:  His weight 195.  He is alert, pleasant,  and talkative.  Speech  is somewhat slurred.  He finds his words easily.  VITAL SIGNS:  Blood pressure is 100/80 sitting, 104/80 standing, and  heart rate 69.  HEENT:  He has left facial droop.  Tongue protrudes to the left.  Pupils  equal, round, and reactive to light.  The eyelids are functioning  appropriately.  Sclerae are nonicteric.  Conjunctivae are clear.  Oropharynx is clear.  NECK:  No bruits.  No masses.  No jugular venous distention.  LUNGS:  Clear.  HEART:  PACs.  A grade 2/6 systolic outflow murmur.  ABDOMEN:  Soft and nontender.  No masses.  EXTREMITIES:  Without edema.  NEUROLOGICAL:  He does have a left arm weakness and a slight decrease in  left grip.  Lower extremities are symmetric.  Deep tendon reflexes are  appropriate and symmetric.   EKG shows normal sinus rhythm, old inferior lateral myocardial  infarction, and PACs.   IMPRESSION:  1. Left-sided cerebrovascular accident with facial and left arm      weakness.  2. Known ischemic heart disease with 3 separate bypass surgeries in      1992, 1997, and again in 2007.  3. Hypertension, controlled.  4. Hypercholesterolemia.  5. Abdominal aortic aneurysm noted in September 2009 being 4 cm.  6. History  of heart failure in September 2009, resolved.  7. History of gallstones.   PLAN:  We will admit.  We will have Neurology to see.  We will have  evaluation and rehab.  We will get a 2-D echocardiogram as an outpatient  leading up to his admission.  He has past the interval where we can have  acute intervention or code stroke, but he will be admitted to telemetry  and observed and evaluated.      [name], M.D.  Electronically Signed     SNT/MEDQ  D:  09/28/2008  T:  09/29/2008  Job:  [id number]

## 2011-02-13 NOTE — Cardiovascular Report (Signed)
[name], [name] NO.:  [id number]   MEDICAL RECORD NO.:  [id number]          PATIENT TYPE:  OIB   LOCATION:  1961                         FACILITY:  [facility]   PHYSICIAN:  Colleen Can. Deborah Chalk, M.D.DATE OF BIRTH:  [birth year]   DATE OF PROCEDURE:  DATE OF DISCHARGE:  01/07/2009                            CARDIAC CATHETERIZATION   PROCEDURE:  Left heart catheterization with selective coronary  angiography, left ventricular angiography, angiography of the radial  artery, graft to the distal right coronary artery, angiography of the  left internal mammary graft to the LAD, and angiography of the saphenous  vein graft to the right coronary artery, and saphenous vein graft to the  distal left circumflex with 2 occluded vein graft aortic connections  demonstrated.   CATHETERS:  A 4-French 4-curved Judkins left coronary catheter, 4-French  pigtail ventriculographic catheter, 4-French 3-DRC catheter, 4-French  left coronary artery bypass graft catheter.   CONTRAST MATERIAL:  Omnipaque.   MEDICATIONS GIVEN PRIOR TO PROCEDURE:  Valium 10 mg.   MEDICATIONS GIVEN DURING PROCEDURE:  Versed 3 mg IV.   COMMENT:  The patient tolerated the procedure well.   HEMODYNAMIC DATA:  The LV pressure 169/19-25, aortic pressure was  168/89.  There was no aortic valve gradient noted on pullback.   ANGIOGRAPHIC DATA:  The left ventricular angiogram showed inferior  basilar aneurysm with a global ejection fraction of approximately 30%.  The anterior apical wall contracted well.   CORONARY ARTERIES:  1. Left main coronary artery is occluded proximally.  2. Left circumflex is occluded proximally.   Saphenous vein graft to the right coronary artery arises from a common  knob.  There are 3 different grafts that arise from the same  distribution.  One is an old occluded vein graft.  There is a second  vein graft that goes to the distal right coronary artery near the crux.  It has  disease proximally which would amount to approximately at 90%  narrowing.  However, this goes to what clearly is the inferior  aneurysmal area.   The radial artery graft to the distal right coronary artery arises at  the same knob with 3 different vessels arising from it to the right  coronary artery distribution.  This artery graft is an excellent graft  and is smooth and it goes to the posterior descending and then gives  collaterals to the distal left circumflex.  An obtuse marginal of the  left circumflex branch fills briskly from this vessel.  There is  limitation in the ability for that vessel to have retrograde flow back  to the more proximal right coronary artery.   Saphenous vein graft to the distal left circumflex is patent.  It is  oversized for a relatively small vessel and has some evidence of slow  flow, but it is not obstructive.   The left internal mammary graft to the LAD is an excellent graft.  It  supplies collaterals to the distal right coronary artery distribution  inferiorly.   The saphenous vein graft to the left circumflex and what was presumed to  be the saphenous  vein graft to the diagonal are totally occluded.  They  are 2 graft marking sites that have occluded proximal knobs.   OVERALL IMPRESSION:  1. Left ventricular dysfunction with inferobasilar aneurysm.  2. Patent radial artery graft to the distal right coronary artery and      patent left internal mammary artery graft to the left anterior      descending with a patent saphenous vein graft to the distal left      circumflex with these grafts supplying retrograde fill to the left      circumflex distribution and distal right coronary artery      distribution.  There is also a patent saphenous vein graft to the      right coronary artery distally and this is a diseased graft, but      appears to go to what would be primarily an infarcted portion of      the myocardium.  3. Totally occluded native  circulation.   DISCUSSION:  Overall, I think that [name] probably has satisfactory  revascularization.  There is the potential for complex angioplasty of a  diseased vein graft to the more proximal portions of the distal right  coronary artery, but I think that that is primarily infarcted and  scarred aneurysmal area that would not warrant the revascularization  procedure.  He has arterial grafts to the left anterior descending in  arterial graft to the distal right coronary artery and collaterals to  the left circumflex distribution as well as an oversized vein graft to  the small branches of the left circumflex, so I think he is  revascularized at this point in time.  I think with the left ventricular  dysfunction, he would warrant an ICD given his functional status and age  at this point in time.       Colleen Can. Deborah Chalk, M.D.  Electronically Signed     SNT/MEDQ  D:  01/07/2009  T:  01/08/2009  Job:  [id number]

## 2011-02-13 NOTE — Procedures (Signed)
DUPLEX ULTRASOUND OF ABDOMINAL AORTA   INDICATION:  Followup known abdominal aortic aneurysm.   HISTORY:  Diabetes:  No.  Cardiac:  Pacemaker.  Hypertension:  Yes.  Smoking:  Quit.  Connective Tissue Disorder:  Family History:  No.  Previous Surgery:  No.   DUPLEX EXAM:         AP (cm)                   TRANSVERSE (cm)  Proximal             3.91 cm                   4.28 cm  Mid                  4.47 cm                   4.82 cm  Distal               3.57 cm                   4.06 cm  Right Iliac          1.50 cm                   1.38 cm  Left Iliac           1.21 cm                   1.38 cm   PREVIOUS:  Date:  AP:  4.0  TRANSVERSE:  4.0   IMPRESSION:  Abdominal aortic aneurysm noted with largest measurement of  4.47 cm x 4.82 cm, which is an increase from previous study.   ___________________________________________  Larina Earthly, M.D.   CB/MEDQ  D:  08/19/2009  T:  08/19/2009  Job:  [id number]

## 2011-02-13 NOTE — Op Note (Signed)
NAMEDRAVEN, NATTER NO.:  [id number]   MEDICAL RECORD NO.:  [id number]          PATIENT TYPE:  INP   LOCATION:  4729                         FACILITY:  [facility]   PHYSICIAN:  Duke Salvia, MD, FACCDATE OF BIRTH:  [birth year]   DATE OF PROCEDURE:  01/31/2009  DATE OF DISCHARGE:  02/01/2009                               OPERATIVE REPORT   PREOPERATIVE DIAGNOSIS:  Ischemic cardiomyopathy.   POSTOPERATIVE DIAGNOSIS:  Ischemic cardiomyopathy.   PROCEDURE:  Dual-chamber defibrillator implantation with high-voltage  defibrillator lead testing.   Following obtaining informed consent, the patient was brought to the  Electrophysiology Laboratory and placed on the fluoroscopic table in the  supine position.  After routine prep and drape of the left upper chest,  lidocaine was infiltrated in prepectoral subclavicular region.  An  incision was made and carried down to the layer of the prepectoral  fascia using electrocautery and sharp dissection.  A pocket was formed  similarly.  Hemostasis was obtained.   Thereafter, attention was turned to gain access to extrathoracic left  subclavian vein, which was accomplished without difficulty, without the  aspiration of air or puncture of the arteries.  Two separate  venipunctures were accomplished.  Guidewires were placed and retained.  Sequentially, Medtronic [id number], 65-cm dual-coil active fixation  defibrillator lead, serial #[id number] and Medtronic [id number], 52-cm active-  fixation atrial lead, serial number [id number] were placed and  manipulated to the right ventricular apex and the right atrial  appendages respectively with the bipolar R-wave was 15.4 with a pace  impedance of 1105, and threshold of 1.5 volts at 0.5 milliseconds.  Current threshold was 1.7 mA and there is no diaphragmatic pacing at 10  volts.   The bipolar P-wave was 2.7 with a pace impedance of 779 and threshold  2.1 volts at 0.5 milliseconds.  Current  threshold is 3.3 mA.  Again,  there was no diaphragmatic pacing at 10 volts and the current of injury  was brisk.   Leads were then attached to Medtronic Virtuoso device, serial number  [id number].  Through the device bipolar P-wave was 2 with a pace  impedance of 551, threshold of 1.3 volts at 0.5 milliseconds.  The R-  wave was 15 with a pace impedance of 703 and threshold of 1 volts at  0.5.  Proximal coil impedance was 49, distal coil impedance was 43.   At this point, high voltage testing was undertaken with DFT testing  deferred because the patient had an impending carotid endarterectomy.  A  1-joule shock was delivered through measured resistance of 48 ohms  synchronously delivered.   At this point, the device was implanted.  The pocket was copiously  irrigated with antibiotic containing saline solution.  Hemostasis was  assured, and the leads and the pulse generator were placed in the pocket  and secured to  the prepectoral fascia.  The wound was closed in three layers in normal  fashion.  The wound was washed, dried, and a benzoin and Steri-Strip  dressing was applied.  Needle counts, sponge counts, and instrument  counts were correct at the  end of the procedure according to the staff.  The patient tolerated the procedure without complication.      Duke Salvia, MD, [facility]  Electronically Signed     Duke Salvia, MD, [facility]  Electronically Signed    SCK/MEDQ  D:  03/17/2009  T:  03/18/2009  Job:  [id number]   cc:   Roney Marion, MD

## 2011-02-16 NOTE — Discharge Summary (Signed)
[place]  Patient:    [name], [name]                     MRN: [id number] Adm. Date:  [id number] Disc. Date: [id number] Attending:  Eleanora Neighbor Dictator:   Jennet Maduro Earl Gala, R.N., A.N.P. CC:         Colleen Can. Deborah Chalk, M.D.  Roney Marion, M.D.   Discharge Summary  PRIMARY DISCHARGE DIAGNOSIS:  Subendocardial myocardial infarction with small rise in cardiac enzymes and subsequent coronary angiography revealing inferior basilar hypokinesia with reasonably well-preserved left ventricular function, totally occluded native circulation, patent saphenous vein graft in a sequential manner to the diagonal and obtuse marginal, patent saphenous vein graft to the posterior of the right coronary with sluggish but patent flow into the continuation branch of the right coronary artery in the arteriovenous groove via separate old and diseased vein graft, patent left internal mammary artery to the left anterior descending coronary artery.  He will continue to be treated medically.  SECONDARY DISCHARGE DIAGNOSES: 1. Arteriosclerotic cardiovascular disease with previous history of coronary    artery bypass grafting in 1992 with left internal mammary artery to the    left anterior descending coronary artery and single vein grafts to the    diagonal/obtuse marginal system and posterior descending.  He underwent    redo coronary artery bypass grafting in April of 1997 with saphenous vein    graft to the diagonal and obtuse marginal system, and a saphenous vein    graft to the posterior descending and posterolateral branches. 2. Hypertensive heart disease, currently controlled. 3. Hypercholesterolemia. 4. Obesity.  HISTORY OF PRESENT ILLNESS:  Mr. Rathgeber is a 75 year old white male who has had known coronary disease with redo coronary artery bypass grafting.   He presents to our office as a walk-in on the morning of March 09, 2001 with complaints of an  indigestion-like feeling that had been occurring off and on over the course of the weekend.  It was identical to his previous chest pain syndrome.  He was subsequently referred for elective coronary angiography.  A 12-lead electrocardiogram performed in the office did show DD:  03/24/01 TD:  03/25/01 Job: [id number]

## 2011-02-16 NOTE — Cardiovascular Report (Signed)
[name]. [facility]  Patient:    [name], [name]                     MRN: [id number] Proc. Date: 03/19/01 Adm. Date:  [id number] Attending:  Eleanora Neighbor                        Cardiac Catheterization  HISTORY:  [name] has had previous coronary artery surgery, including redo surgery in 1997.  He is referred for followup catheterization after having a period of indigestion over the last three to four days.  PROCEDURE:  Left heart catheterization with selective coronary angiography, left ventricular angiography.  TYPE AND SITE OF ENTRY:  Percutaneous, right femoral artery.  CATHETERS:  Six [place] four curved Judkins right and left coronary catheters, 6-French pigtail ventriculography catheter, 6-French right bypass graft catheter.  CONTRAST MATERIAL:  Omnipaque.  MEDICATIONS GIVEN PRIOR TO PROCEDURE:  Valium 10 mg p.o.  MEDICATIONS GIVEN DURING PROCEDURE:  Versed 2 mg IV.  COMMENTS:  The patient tolerated the procedure well.  HEMODYNAMIC DATA:  The aortic pressure was 113/85, LV pressure 116/6-17. There is no aortic valve gradient noted on pullback.  ANGIOGRAPHIC DATA: 1. Left main coronary artery:  The left main coronary artery is totally    occluded.  There is a small nub present. 2. Right coronary artery:  The right coronary artery is totally occluded    in the mid portion.  There is some competitive bidirectional flow at that    point.  Severe and diffuse disease encompasses the entire proximal portion    of the right coronary artery.  Before the total occlusion, there is    sequential 80% and then 95% stenoses. 3. Saphenous vein graft from the high marker is totally occluded. 4. Saphenous vein graft that goes to the proximal diagonal vessel and then to    the distal obtuse marginal is patent, although marked irregularities in the    vein.  The graft into the distal marginal vessel is satisfactory.  There is    clearly  satisfactory filling of the diagonal system, although the vessel is    diffusely diseased and it is felt that there is a good chance that this is    the culprit lesion noted.  It appears that the side-to-side anastomosis    involves the area of severe disease in the diagonal vessel.  However, there    is retrograde filling back to the more proximal branches of the left    anterior descending, but there is diffuse disease at this location. 5. Left internal mammary artery graft to the distal LAD:  Widely patent.    There is a nice insertion.  The distal vessel is relatively small, but the    runoff is satisfactory. 6. Saphenous vein graft to the distal right coronary artery:  This vein graft    rises from the vein marker that is located inferior on the left of the    projection.  There are actually two veins that rise.  There is a vein that    supplies the right coronary artery, including a bifurcating marginal    vessel.  There is another vein graft that appears to have flow in the more    proximal segments of the right coronary artery and probably was from a    previous surgery.  This has somewhat sluggish flow, but it is satisfactory    and there  are distal branches of the right coronary artery that seemingly    fill by way of the second branch.  There is a larger continuation branch    that one can see on latter views that fills in a very circuitous manner    over to the posterolateral branch in the AV groove.  This area would not    be approachable by angioplasty.  It is a potential area of ischemia, but it    appears to have some degree of collateral flow as well.  LEFT VENTRICULAR ANGIOGRAM:  Left ventricular angiogram was performed in the RAO position.  Overall cardiac size and silhouette are normal.  There is inferobasilar akinesis.  The global ejection fraction is 50%.  OVERALL IMPRESSION: 1. Inferobasilar hypokinesia with reasonably well preserved left ventricular     function. 2. Totally occluded native circulation. 3. Patent saphenous vein graft in a sequential manner to the diagonal vessel    and obtuse marginal, and patent saphenous vein graft to the posterior    descending branch of the right coronary artery with sluggish but patent    flow into the continuation branch of the right coronary artery in the AV    groove via separate old and diseased vein graft, patent left internal    mammary artery to the left anterior descending artery.  DISCUSSION:  There are two potential areas of ischemia for [name].  One would be in the continuation branch of the right coronary artery.  It is a diffusely diseased vessel.  There is some overlapping collateral flow from the second vein graft to the distal right coronary artery, but both branches are of a size that there could be potential ischemia inferiorly from the more diseased right coronary graft.  This will need to be managed medically.  There also appears to be relatively sluggish flow into a moderate sized diagonal vessel.  The vein graft to this is diseased somewhat, but probably is satisfactory.  The first diagonal vessel is so diffusely diseased that redo surgery is not really an option for that vessel.  We will try to manage [name] medically at this point in time. DD:  03/19/01 TD:  03/20/01 Job: [id number]

## 2011-02-16 NOTE — Cardiovascular Report (Signed)
NAMEMICAHEL, OMLOR NO.:  [id number]   MEDICAL RECORD NO.:  [id number]          PATIENT TYPE:  OIB   LOCATION:  1961                         FACILITY:  [facility]   PHYSICIAN:  Colleen Can. Deborah Chalk, M.D.DATE OF BIRTH:  11-30-1935   DATE OF PROCEDURE:  10/31/2005  DATE OF DISCHARGE:                              CARDIAC CATHETERIZATION   PROCEDURES:  1.  Left heart catheterization.  2.  Selective coronary angiography.  3.  Left ventricular angiography.  4.  Saphenous vein graft angiography x2.  5.  Angiography of the left internal mammary artery.   TYPE AND SITE OF ENTRY:  percutaneous right femoral artery.   CATHETERS:  1.  4-French 4-curved Judkins right and left coronary catheters.  2.  4-French pigtail ventriculographic catheter.   MEDICATIONS GIVEN PRIOR TO PROCEDURE:  Valium 10 mg p.o.   MEDICATIONS GIVEN DURING THE PROCEDURE:  Versed 2 mg IV.   COMMENTS:  The patient tolerated the procedure well.   HEMODYNAMIC DATA:  1.  Aortic pressure 84/58.  2.  Left ventricular pressure 100/3-6.  3.  There was no aortic valve gradient noted on pullback.   ANGIOGRAPHIC DATA:  The left ventricular angiogram was performed in the RAO  position. Overall cardiac size and silhouette were normal. There was a  moderately large inferobasilar aneurysmal segment. The anterior wall and  distal inferoapical wall contracted well, giving global ejection fraction  estimated to be in the 40% range. There was no mitral regurgitation.   CORONARY ARTERIES:  1.  Left main coronary artery was totally occluded.  2.  RIGHT CORONARY ARTERY. The right coronary artery has diffuse and severe      disease, and appears to be occluded at the crux; although there may be      some slight bidirectional flow at that point in time. There are      sequential 90-95% stenoses throughout the body of the right coronary      artery.  3.  GRAFTS:      1.  The saphenous vein graft to the diagonal and  obtuse marginal has          relatively smooth body of the graft, but at the level of the          insertion there were discrete 95% stenoses. The vein graft is          oversized for the size of the vessel; but, it does appear that the          distal vessel could possibly be suitable for redo grafting, and that          the distal vessels are large enough to be grafted.      2.  The saphenous vein graft that was old into the diagonal vessel, was          totally occluded and only a the small hood remained.      3.  The saphenous vein graft to the right coronary artery is a complex          appearing graft.  It could be best explained  by the fact that the          old saphenous vein graft was left in place, and the newer redo          saphenous vein graft was sewn over top of the old saphenous vein          graft in the same hood on the aorta. One injection causes both the          saphenous vein grafts to appear. The old saphenous vein graft is          severely diseased, and has hang-up of dy in the mid portion. There          was a 95% stenosis proximally. This vein graft plugs into the right          coronary artery near the crux. It does appear to give flow to a          relatively large posterolateral branch. What would appear to be the          newer vein graft, was a sequential graft to the posterior descending          and posterolateral branch. One of these branches is severely          occluded with a 99% stenosis, and we could see a relatively large          vessel on the inferior wall that has potential ischemia. The vein          graft does go into, what appears to be, a posterior descending          vessel and provides adequate flow to that. On the inferior wall of          the heart, this second posterior descending and then the          posterolateral branches would be severely compromised.      4.  The left internal mammary graft to the LAD was widely patent, with a           nice insertion and good distal runoff.   OVERALL IMPRESSION:  1.  Inferobasal aneurysm, with moderate left ventricular dysfunction.  2.  Totally occluded native circulation.  3.  Patent left internal mammary graft to LAD.  4.  Severely diseased saphenous vein grafts sequentially to the diagonal and      obtuse marginal.  A severely diseased new saphenous vein graft to the      posterolateral branch, and persistent use of a severely diseased old      saphenous vein graft to the distal posterolateral branch.   DISCUSSION:  In light of these findings, and with the ongoing symptoms, we  will need to admit Mr. Tweed and proceed on with probable plans and  evaluation for redo coronary artery bypass grafting.      Colleen Can. Deborah Chalk, M.D.  Electronically Signed     SNT/MEDQ  D:  10/31/2005  T:  10/31/2005  Job:  [id number]
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
## 2011-02-16 NOTE — Discharge Summary (Signed)
[place]. [facility]  Patient:    [name], [name]                     MRN: [id number] Adm. Date:  [id number] Disc. Date: [id number] Attending:  Eleanora Neighbor Dictator:   Jennet Maduro Earl Gala, R.N., A.N.P. CC:         Roney Marion, M.D.   Discharge Summary  PRIMARY DISCHARGE DIAGNOSIS:  Subendocardial myocardial infarction with small rise in cardiac enzymes and subsequent coronary angiography revealing inferior basilar hypokinesia with reasonably well-preserved left ventricular function, totally occluded native circulation, patient saphenous vein graft and a sequential managed with diagonal and obtuse marginal, patient saphenous vein graft to the posterior descending of the right coronary with sluggish but patent flow into the continuation branch of the right coronary artery in the ______ groove via separate old and diseased vein graft, patent left internal mammary artery to the left anterior descending.  He will continue to be treated medically.  SECONDARY DISCHARGE DIAGNOSES: 1. Atherosclerotic cardiovascular disease with a previous history of coronary    artery bypass grafting in 1992 with left internal mammary to the left    anterior descending and single vein grafts to the diagonal/obtuse marginal    system at posterior descending.  He underwent redo coronary artery bypass    grafting in April 1997 with saphenous vein graft to the diagonal and    ______ system and the saphenous vein graft to the posterior descending and    posterolateral branches. 2. Hypertensive heart disease, currently controlled. 3. Hypercholesterolemia. 4. Obesity.  HISTORY OF PRESENT ILLNESS:  This is a 75 year old white male who has had known coronary disease with redo coronary artery bypass grafting.  He presents to our office as a walk-in on the morning of July 19 with complaints of an indigestion-like feeling that had been occurring off and on over the course of the  weekend.  It was identical to his previous chest pain syndrome.  He was subsequently referred for elective coronary angiography.  A 12-lead electrocardiogram performed in the office did show new T-wave changes.  Please see the dictated history and physical for further patient presentation and profile.  LABORATORY DATA ON ADMISSION:  Lipid panel showed total cholesterol 155, triglycerides 252, HDL 25, LDL 80, cardiac enzymes, first CK was 152 with an MB of 19.7, second CK was 169 with an MB of 12.6.  Troponin-Is were elevated at 1.08, 0.98, 0.56 and subsequently 0.27.  Chemistries showed a sodium of 136, potassium 3.8, chloride 105, CO2 28, BUN 21, creatinine 1.1 and a glucose of 103.  CBC was within normal limits.  HOSPITAL COURSE:  The patient was admitted to undergo coronary angiography later on that day.  The results are as noted above.  There was felt to be two potential areas of ischemia for the patient.  One would be in the continuation branch of the right coronary artery.  It is a diffusely diseased vessel. There is some overlapping collateral flow from the second vein graft to the distal right coronary artery, but both branches are of a size that there could be potential ischemia inferiorly from the more diseased right coronary graft. This will need to be managed medically.  There also appears to be relatively sluggish flow into a moderate-sized diagonal vessel and the vein graft ______ somewhat, but probably is satisfactory.  The first diagonal vessel is so diffusely diseased, that redo surgery is really not an option and the patient  will need to continue to be managed medically with aggressive risk factor modification.  The patient was placed on Plavix as well as oral nitrate therapy.  He did well throughout the remainder of his hospitalization with no recurrence of chest pain/indigestion-like discomfort.  Cardiac rehabilitation was initiated. Today on March 24, 2001, he is  doing well.  He is to continue to be pain-free. He has had some intermittent hypotension and his medicines have been adjusted accordingly and he is felt to be a stable candidate for discharge today.  DISCHARGE CONDITION:  Stable.  DISCHARGE MEDICATIONS:  We will be stopping the Diovan, HCTZ.  Will add: 1. Plavix 75 mg a day. 2. Imdur 60 mg a day. 3. Diovan 80 mg a day. He will resume his lipitor 40 mg a day, Prilosec 20 mg a day, Prozac 20 mg a day, multivitamin daily and aspirin daily.  ACTIVITY:  To be light with no driving.  He is not to return to work as of yet.  DIET:  Low-fat.  SPECIAL INSTRUCTIONS:  He is to walk 5-10 minutes two times a day. He is to call our office for any problems, otherwise will see him back in the office on July 9 at 9:45 a.m.  DD:  03/24/01 TD:  03/24/01 Job: [id number]

## 2011-02-16 NOTE — Op Note (Signed)
[name], [name] NO.:  [id number]   MEDICAL RECORD NO.:  [id number]          PATIENT TYPE:  INP   LOCATION:  2313                         FACILITY:  [facility]   PHYSICIAN:  Kerin Perna, M.D.  DATE OF BIRTH:  04-Jun-1936   DATE OF PROCEDURE:  11/06/2005  DATE OF DISCHARGE:                                 OPERATIVE REPORT   OPERATION:  1.  Redo coronary bypass grafting x2 (third time redo using left radial      artery graft to distal right coronary and the saphenous vein graft to      distal circumflex).  2.  Placement of intra-aortic balloon pump.   PRE AND POSTOPERATIVE DIAGNOSIS:  Class IV unstable angina with severe  recurrent coronary artery disease status post two previous cardiac  revascularization operations.   SURGEON:  Kerin Perna, MD   ASSISTANT:  Gershon Crane PA-C   ANESTHESIA:  General by Dr. Arta Bruce.   INDICATIONS:  The patient is a 75 year old patient of Dr. Deborah Chalk, who was  admitted to hospital with unstable angina. Cardiac catheterization  demonstrated severe progression of his coronary disease with vein graft  disease in the distribution to his distal right coronary and distal  circumflex circulation. His mammary artery graft from previous operation was  patent. He was not felt to be a good candidate for percutaneous  intervention. A second time redo operation was felt to be his best option  for controlling his symptoms of angina and preservation of LV function.   Prior to surgery, I examined the patient in his hospital room and reviewed  results the cardiac catheterization with the patient and family. I discussed  indications and expected benefits of the second time redo coronary bypass  surgery for treatment of his severe coronary disease. I reviewed the  alternatives to surgical therapy as well and the expected outcome of those  alternative therapies. I discussed with the patient the major aspects of the  planned procedure  including the location of the surgical incisions, the  choice to use radial artery and saphenous vein as conduit, use of general  anesthesia and cardiopulmonary bypass, and expected postoperative hospital  recovery. The patient understood that this operation would be at higher risk  than a standard heart bypass operation due to his two previous heart  operations, a lack of conduit, and an old inferolateral MI. He also  understood that he would be at increased risk for other complications such  as stroke, bleeding, blood transfusion requirement, wound infection,  pneumonia, prolonged ventilation dependence, and death. After reviewing all  these issues on several occasions with the patient. He demonstrated his  understanding and agreed to proceed with operation as planned under what I  felt was an informed consent.   OPERATIVE FINDINGS:  There were dense adhesions. The mammary artery graft  was identified and was preserved.  It was occluded with a vascular bulldog  during period of global cardioplegic ischemic arrest. The saphenous vein was  harvested from the left thigh and was a good conduit. Left radial artery was  harvested and was  good conduit. The patient received the aprotinin protocol  for this operation to reduce bleeding and reduce the requirement for blood  transfusion requirement. The patient received unit of platelets and FFP  after reversal of heparin with protamine due to persistent coagulopathy. The  diagonal branch which had been previously bypassed in the previous operation  was not a graftable vessel.   PROCEDURE:  The patient was brought to the operating room and placed supine  on the operating room table where general anesthesia was induced under  invasive hemodynamic monitoring. The chest, abdomen, left arm and legs were  prepped and draped as a sterile field. First left radial artery was  harvested with a forearm incision using a harmonic scalpel. This was good   vessel with excellent flow and was flushed with heparin papaverine solution.  Prior to removing the vessel, the patency of the left palmar arch was  documented with Doppler studies. The incision was closed and the left arm  was tucked at the patient's side.   A sternal incision was then made as the saphenous vein was harvested from  the left thigh using an open technique. After the sternal wires were  removed, an oscillating saw was used to divide the sternum with care being  taken to avoid injury to the underlying vascular structures. There were  dense mediastinal adhesions and these were taken down with sharp dissection  and with electrocautery to expose the anterior structures of the mediastinum  including the ascending aorta right atrium and right ventricle. The mammary  artery pedicle was identified and protected as it entered the pericardium  from the left pleural space. This was surrounded with a vessel loop. After a  maximum anterior dissection was completed, the patient was heparinized and  pursestrings were placed in the ascending aorta or proximal arch and right  atrium and the patient was cannulated and placed on bypass. The remainder of  the dissection was performed on bypass with exception of lifting the heart  or disturbing any of the old vein grafts. After the radial artery and vein  grafts were prepared for the distal anastomoses, cardioplegia catheters were  placed for both antegrade aortic and retrograde coronary sinus cardioplegia.  The patient was then cooled to 30 degrees. Aortic crossclamp was applied.  800 mL of cold blood cardioplegia was delivered in split doses between the  antegrade aortic and retrograde coronary sinus catheters. There is good  cardioplegic arrest and septal temperature dropped less than 15 degrees.   The distal coronary anastomoses were performed. The first distal anastomosis was the distal circumflex. There is a 1.5 mm vessel and had a  proximal 90%  stenosis. Reverse saphenous vein was sewn end-to-side with running 7-0  Prolene and there was good flow through the graft. Cardioplegia was redosed  every 20 minutes while the crossclamp was in place. The second distal  anastomosis was to the posterolateral branch of the distal right. This was  basically totally occluded from its inflow from a old vein graft which was  occluded. The radial artery was then sewn end-to-side using running 8-0  Prolene to this vessel and there was excellent flow. Cardioplegia was  redosed.   While crossclamp was still in place, the proximal anastomoses were created.  The vein graft proximal anastomosis was made on the ascending aorta using a  4.0-mm punch and running 5-0 Prolene. The proximal anastomosis for the  radial artery was the proximal segment of the patent vein graft to the  posterior descending. An end-to-side anastomosis was completed using a  running 7-0 Prolene.   A vent was then placed in the ascending aorta and a dose of retrograde warm  blood cardioplegia (hot shot) was then administered as the mammary pedicle  bulldog was removed and the heart was warmed.  Air was vented from the  coronaries and the left side of the heart using a dose of retrograde warm  blood cardioplegia and the usual de-airing maneuvers on bypass and then the  crossclamp was removed.   The heart resumed a spontaneous rhythm. Air was aspirated from the vein  grafts using a 27 gauge needle. The cardioplegia cannulas were removed. The  proximal and distal anastomoses were checked and found to be hemostatic. The  patient was rewarmed and reperfused. Temporary pacing wires were applied.  When the patient reached 37 degrees, his lungs were re-expanded and the  ventilator was resumed. The patient was then weaned from bypass without  difficulty on low-dose dopamine. Overall cardiac function appeared to be  excellent and cardiac output was stable. The patient  received protamine.  There is no adverse reaction. The patient had continued to coagulopathy and  received platelets and FFP. The cannulas were removed. After the final  platelet transfusion was being administered, the patient had an episode of  hypotension, pulmonary hypertension, and ST-segment changes. This did not  resolve with waiting and support and a balloon pump was then placed via the  right femoral artery and secured to the skin. Immediate counter pulsation  improved hemodynamics and eventually the EKG changes and hemodynamic changes  completely resolved. A transesophageal echo showed LV function to be  initially globally hypokinetic and after time and passed overall function  improved back to baseline which was abnormal only in the inferolateral area  from old MI. It was felt the hemodynamic changes were probably related to a  delayed episode of intracoronary air.  The mediastinum was irrigated with a antibiotic irrigation. The leg incision  was irrigated and closed in standard fashion. Two mediastinal and a right  pleural chest tube were placed and brought out through separate incisions.  Care was taken to avoid injury to the abdominal wall mesh which had been  placed for a ventral hernia previously. The sternum was then closed with  interrupted steel wire. The pectoralis fascia was closed with running #1  Vicryl. Subcutaneous and skin layers were closed in running Vicryl and  sterile dressings were applied. Total bypass time was 158 minutes and total  crossclamp time was 125 minutes.      Kerin Perna, M.D.  Electronically Signed     PV/MEDQ  D:  11/06/2005  T:  11/07/2005  Job:  [id number]   cc:   Colleen Can. Deborah Chalk, M.D.  Fax: [phone]   [facility] of[facility]

## 2011-02-16 NOTE — Op Note (Signed)
[name], GUMZ NO.:  [id number]   MEDICAL RECORD NO.:  [id number]          PATIENT TYPE:  INP   LOCATION:  2313                         FACILITY:  [facility]   PHYSICIAN:  Sheldon Silvan, M.D.      DATE OF BIRTH:  04-06-1936   DATE OF PROCEDURE:  11/06/2005  DATE OF DISCHARGE:                                 OPERATIVE REPORT   PROCEDURE:  Intraoperative transesophageal echocardiography (TEE).   Mr. Gartman was brought to the operating room by Dr. Donata Clay and underwent  general anesthesia with coronary artery bypass grafting.  I was called  emergently to the operating room in place of Dr. Michelle Piper who was covering at  the time due to difficulties with the patient post bypass.  It appeared he  was having elevated ST segments with elevated pulmonary artery pressures and  moderately decreased systolic blood pressure.  Dr. Donata Clay requested that  we place a TEE probe to help with diagnosis and monitoring of the patient's  status.   The TEE probe was sheathed and lubricated appropriately and using the  [employer] probe, it was passed through the oropharynx with  some difficulty.  There was no bleeding that occurred at the time of  passage.   The heart was imaged.  The left ventricle appeared to contract fairly well,  except in the inferolateral portion.  This was where there had been a fixed  defect prior to surgery that Dr. Donata Clay was aware of.   The mitral valve was imaged and was showing good leaflet apposition on the 2-  D mode and then in the color Doppler mode, it was noted that there was 1+  mitral regurgitation.  There was no air present in the left atrium or in the  ventricles.   The aortic valve was imaged and was tricuspid with good apposition of the  cusps.  There was no sclerosis of the cusp edges.  There was only trace  aortic regurgitation on Colorflow exam.   The right ventricle appeared to be somewhat small and collapsed.  It  was not  hypertrophied or enlarged in any way.  The tricuspid valve was seen and  functioned well in the 2-D mode.   The heart appeared to improve in terms of its function with systolic  pressures rising and adequate fluid being given with monitoring of the LV  status.  It was felt that possibly some air had caused the ST elevations but  the heart recovered nicely and prior to the patient leaving the room, the  TEE probe was removed.  It was noted that there was some blood on the tip of  the probe as it was removed.  The patient will be followed to make certain  there was no esophageal injury.      Sheldon Silvan, M.D.  Electronically Signed    DC/MEDQ  D:  11/07/2005  T:  11/07/2005  Job:  [id number]   cc:   Anesthesia Dept.
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
## 2011-03-09 ENCOUNTER — Other Ambulatory Visit: Payer: Self-pay | Admitting: *Deleted

## 2011-03-09 ENCOUNTER — Encounter: Payer: Self-pay | Admitting: *Deleted

## 2011-03-09 DIAGNOSIS — E785 Hyperlipidemia, unspecified: Secondary | ICD-10-CM

## 2011-03-12 ENCOUNTER — Other Ambulatory Visit: Payer: Medicare Other | Admitting: *Deleted

## 2011-03-12 ENCOUNTER — Ambulatory Visit (INDEPENDENT_AMBULATORY_CARE_PROVIDER_SITE_OTHER): Payer: Medicare Other | Admitting: Cardiology

## 2011-03-12 ENCOUNTER — Other Ambulatory Visit (INDEPENDENT_AMBULATORY_CARE_PROVIDER_SITE_OTHER): Payer: Medicare Other | Admitting: *Deleted

## 2011-03-12 ENCOUNTER — Encounter: Payer: Self-pay | Admitting: Cardiology

## 2011-03-12 DIAGNOSIS — I2589 Other forms of chronic ischemic heart disease: Secondary | ICD-10-CM

## 2011-03-12 DIAGNOSIS — F341 Dysthymic disorder: Secondary | ICD-10-CM

## 2011-03-12 DIAGNOSIS — E785 Hyperlipidemia, unspecified: Secondary | ICD-10-CM

## 2011-03-12 DIAGNOSIS — F329 Major depressive disorder, single episode, unspecified: Secondary | ICD-10-CM | POA: Insufficient documentation

## 2011-03-12 LAB — BASIC METABOLIC PANEL
BUN: 15 mg/dL (ref 6–23)
CO2: 25 mEq/L (ref 19–32)
Chloride: 107 mEq/L (ref 96–112)
Creatinine, Ser: 1.4 mg/dL (ref 0.4–1.5)
Potassium: 3.6 mEq/L (ref 3.5–5.1)

## 2011-03-12 LAB — HEPATIC FUNCTION PANEL
AST: 20 U/L (ref 0–37)
Albumin: 3.7 g/dL (ref 3.5–5.2)
Alkaline Phosphatase: 39 U/L (ref 39–117)
Total Bilirubin: 0.9 mg/dL (ref 0.3–1.2)

## 2011-03-12 LAB — LIPID PANEL
Total CHOL/HDL Ratio: 4
Triglycerides: 148 mg/dL (ref 0.0–149.0)
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
## 2011-03-12 NOTE — Assessment & Plan Note (Signed)
He is better as he talks about his wife. I'll have him see Lawson Fiscal in 10 weeks would check C. Met and lipids on return. I encouraged him to return to his exercise gymn. I will have him see Lawson Fiscal in approximately 10 weeks.

## 2011-03-12 NOTE — Progress Notes (Signed)
Unable to contact, will try again

## 2011-03-12 NOTE — Assessment & Plan Note (Signed)
He's not having any cardiac symptoms currently. I encouraged him to resume his exercise program.

## 2011-03-13 NOTE — Progress Notes (Signed)
Called with results and verbalized understanding. jodette Jarelyn Bambach rn

## 2011-04-08 ENCOUNTER — Telehealth: Payer: Self-pay | Admitting: Adult Health

## 2011-04-08 NOTE — Telephone Encounter (Signed)
Mr. Norwood called complaining of DOE that has been ongoing for over a month. He states that he saw PCP on Friday, July 6th,2012 who referred him to a pulmonologist.  He states that he is tired of being short of breath and admits to some dietary noncompliance.  I have advised him to take extra 40 mg of Lasix to assist in breathing status.  He denies chest pain, or edema, but his breathing is harder.  IF lasix dose dose not offer relief, he is to go to ER for further evaluation, for other etiologies (PE, pneumonia, COPD exacerbation) or for CHF evaluation, in the setting of ischemic CM with EF of 24%.  He verbalizes understanding.

## 2011-04-09 ENCOUNTER — Telehealth: Payer: Self-pay | Admitting: Nurse Practitioner

## 2011-04-09 NOTE — Telephone Encounter (Signed)
Pt complaining of SOB being going on for about a month but has got a lot worse. Called the on call Dr last night and was told to take a water pill. Pt went to urgent care on Friday. Was told to go to pulmonary Dr. Milinda Pointer in box

## 2011-04-09 NOTE — Telephone Encounter (Signed)
Called pt back he is short of breath and has been for the past month. He reports not taking his lasix as prescribed for the past week due to being out of town. He took Lasix 40mg  twice yesterday and only one 40mg  tablet today. Advised as per Lawson Fiscal to take and additional 40mg  today. Lawson Fiscal will see him in the office tomorrow at 1130am. Pt does report he will need to find some transportation. Pt also reports he has a letter that he needs to review with Lawson Fiscal about getting a referral to a lung specialist.

## 2011-04-09 NOTE — Telephone Encounter (Signed)
Please see who he has been assigned to. I can see if I have an opening later this week or he can see the MD assigned to him. Thanks

## 2011-04-10 ENCOUNTER — Emergency Department (HOSPITAL_COMMUNITY): Payer: Medicare Other

## 2011-04-10 ENCOUNTER — Ambulatory Visit (INDEPENDENT_AMBULATORY_CARE_PROVIDER_SITE_OTHER): Payer: Medicare Other | Admitting: Nurse Practitioner

## 2011-04-10 ENCOUNTER — Encounter (HOSPITAL_COMMUNITY): Payer: Self-pay | Admitting: Radiology

## 2011-04-10 ENCOUNTER — Encounter: Payer: Self-pay | Admitting: Nurse Practitioner

## 2011-04-10 ENCOUNTER — Inpatient Hospital Stay (HOSPITAL_COMMUNITY)
Admission: EM | Admit: 2011-04-10 | Discharge: 2011-04-15 | DRG: 291 | Disposition: A | Discharge status: Home Health | Payer: Medicare Other | Attending: Internal Medicine | Admitting: Internal Medicine

## 2011-04-10 DIAGNOSIS — E785 Hyperlipidemia, unspecified: Secondary | ICD-10-CM | POA: Diagnosis present

## 2011-04-10 DIAGNOSIS — J8410 Pulmonary fibrosis, unspecified: Secondary | ICD-10-CM | POA: Insufficient documentation

## 2011-04-10 DIAGNOSIS — Z9981 Dependence on supplemental oxygen: Secondary | ICD-10-CM

## 2011-04-10 DIAGNOSIS — R0902 Hypoxemia: Secondary | ICD-10-CM

## 2011-04-10 DIAGNOSIS — N179 Acute kidney failure, unspecified: Secondary | ICD-10-CM | POA: Diagnosis present

## 2011-04-10 DIAGNOSIS — R0600 Dyspnea, unspecified: Secondary | ICD-10-CM

## 2011-04-10 DIAGNOSIS — R0989 Other specified symptoms and signs involving the circulatory and respiratory systems: Secondary | ICD-10-CM

## 2011-04-10 DIAGNOSIS — R0609 Other forms of dyspnea: Secondary | ICD-10-CM

## 2011-04-10 DIAGNOSIS — J449 Chronic obstructive pulmonary disease, unspecified: Secondary | ICD-10-CM | POA: Diagnosis present

## 2011-04-10 DIAGNOSIS — I2510 Atherosclerotic heart disease of native coronary artery without angina pectoris: Secondary | ICD-10-CM | POA: Diagnosis present

## 2011-04-10 DIAGNOSIS — Z951 Presence of aortocoronary bypass graft: Secondary | ICD-10-CM

## 2011-04-10 DIAGNOSIS — J9620 Acute and chronic respiratory failure, unspecified whether with hypoxia or hypercapnia: Secondary | ICD-10-CM | POA: Diagnosis present

## 2011-04-10 DIAGNOSIS — I509 Heart failure, unspecified: Secondary | ICD-10-CM | POA: Diagnosis present

## 2011-04-10 DIAGNOSIS — I2589 Other forms of chronic ischemic heart disease: Secondary | ICD-10-CM | POA: Diagnosis present

## 2011-04-10 DIAGNOSIS — J4489 Other specified chronic obstructive pulmonary disease: Secondary | ICD-10-CM | POA: Diagnosis present

## 2011-04-10 DIAGNOSIS — I5023 Acute on chronic systolic (congestive) heart failure: Principal | ICD-10-CM | POA: Diagnosis present

## 2011-04-10 DIAGNOSIS — I10 Essential (primary) hypertension: Secondary | ICD-10-CM | POA: Diagnosis present

## 2011-04-10 LAB — CBC
Platelets: 220 10*3/uL (ref 150–400)
RBC: 4.7 MIL/uL (ref 4.22–5.81)
RDW: 15.3 % (ref 11.5–15.5)
WBC: 8.8 10*3/uL (ref 4.0–10.5)

## 2011-04-10 LAB — COMPREHENSIVE METABOLIC PANEL
ALT: 15 U/L (ref 0–53)
Albumin: 2.7 g/dL — ABNORMAL LOW (ref 3.5–5.2)
Calcium: 9 mg/dL (ref 8.4–10.5)
GFR calc Af Amer: >60 mL/min (ref >60)
Glucose, Bld: 94 mg/dL (ref 70–99)
Sodium: 139 mEq/L (ref 135–145)
Total Protein: 7.1 g/dL (ref 6.0–8.3)

## 2011-04-10 LAB — CK TOTAL AND CKMB (NOT AT ARMC)
CK, MB: 1.8 ng/mL (ref 0.3–4.0)
Relative Index: INVALID (ref 0.0–2.5)
Total CK: 66 U/L (ref 7–232)

## 2011-04-10 LAB — PROTIME-INR: Prothrombin Time: 15.6 seconds — ABNORMAL HIGH (ref 11.6–15.2)

## 2011-04-10 LAB — PRO B NATRIURETIC PEPTIDE: Pro B Natriuretic peptide (BNP): 1787 pg/mL — ABNORMAL HIGH (ref 0–125)

## 2011-04-10 LAB — TROPONIN I: Troponin I: <0.3 ng/mL (ref <0.30)

## 2011-04-10 LAB — CARDIAC PANEL(CRET KIN+CKTOT+MB+TROPI)
Relative Index: INVALID (ref 0.0–2.5)
Troponin I: <0.3 ng/mL (ref <0.30)

## 2011-04-10 MED ORDER — IOHEXOL 300 MG/ML  SOLN: 60.0000 mL | Freq: Once | INTRAMUSCULAR | Status: AC | PRN

## 2011-04-10 NOTE — Assessment & Plan Note (Signed)
He is very hypoxic. I worry that with his recent travel, he may have PE. He does respond to oxygen therapy but requiring high flow. He is seen and discussed with Dr. Swaziland. Will plan for admission to hospital. Check stat CT of the chest to r/o PE. Start Lovenox. Check echo and cycle cardiac enzymes. Will change to IV lasix. Further plan to follow per [name] or attending physician's discretion.

## 2011-04-10 NOTE — Progress Notes (Signed)
[name] Date of Birth: Jan 09, 1936   History of Present Illness: [name] is seen today for a work in visit. He is seen for [name]. He is a former patient of Dr. Ronnald Nian. He has an ischemic cardiomyopathy with an ICD in place. EF is 24%. Last echo was in 2009. Last cath in 2010. He is not doing well. He went to a wedding down in [place] about 2 weeks ago. It was outside in the extreme heat. He did not think he got any extra salt and he continued his medicines. No chest pain. He got home on the 1st of July. He has progressively gotten worse. He cannot do anything without extreme dyspnea. He saw his PCP last Friday. He was hypoxic. He is here today and is extremely hypoxic. No chest pain. No hemoptysis. No pain in his legs. He drove to [place]. He has taken extra Lasix but did not have any significant diuresis but felt a little better. He noted that his hands and lips were blue.   Current Outpatient Prescriptions on File Prior to Visit  Medication Sig Dispense Refill  . atorvastatin (LIPITOR) 40 MG tablet Take 40 mg by mouth daily.        . clopidogrel (PLAVIX) 75 MG tablet Take 75 mg by mouth daily.        Marland Kitchen esomeprazole (NEXIUM) 40 MG packet Take 40 mg by mouth daily before breakfast.        . ezetimibe (ZETIA) 10 MG tablet Take 10 mg by mouth daily.        Marland Kitchen FLUoxetine (PROZAC) 40 MG capsule Take 20 mg by mouth daily.       . furosemide (LASIX) 40 MG tablet Take 40 mg by mouth 2 (two) times daily.        . isosorbide dinitrate (ISORDIL) 30 MG tablet Take 30 mg by mouth daily.       Marland Kitchen losartan (COZAAR) 50 MG tablet Take 25 mg by mouth 2 (two) times daily.       . multivitamin (THERAGRAN) per tablet Take 1 tablet by mouth daily.        . nebivolol (BYSTOLIC) 10 MG tablet Take 10 mg by mouth daily.          Allergies  Allergen Reactions  . Sulfonamide Derivatives     Past Medical History  Diagnosis Date  . Clotting disorder     2002 -- CABG x4  . Hypertension   .  Hyperlipidemia   . Ischemic cardiomyopathy   . Systolic heart failure     EF is 24%  . History of atherosclerotic cardiovascular disease   . ICD (implantable cardiac defibrillator) in place 2010  . Hypertensive heart disease     Past Surgical History  Procedure Date  . Coronary artery bypass graft 2007    redo CABG x2 -- at that time had left radial artery graft to the distal right coronary and saphenous vein graft to the distal circumflex.   . Coronary artery bypass graft 1997    redo surgery x2 --  in January 18, 1996, and at that time had a vein graft to the diagonal and OM and a vein graft to the posterior descending and  posterolateral branches   . Coronary artery bypass graft 1992    x2 -- left internal mammary to the LAD, a single vein graft to the diagonal - obtuse marginal system  and posterior descending  . Insert / replace / remove  pacemaker 01/29/2009    [employer]onic Virtuoso II DR four chamber AICD  . Cardiac catheterization 01/07/2009     performed by Dr. Deborah Chalk on January 07, 2009 revealing medically manageable coronary artery disease with reduced EF at 30%   . Hernia repair 1998    History  Smoking status  . Former Smoker -- 1.0 packs/day for 47 years  . Types: Cigarettes  . Quit date: 03/12/2003  Smokeless tobacco  . Never Used    History  Alcohol Use No    Family History  Problem Relation Age of Onset  . Heart attack Brother   . Coronary artery disease Brother   . Coronary artery disease Brother   . Heart disease Brother     Review of Systems: The review of systems is positive for extreme hypoxia and recent travel. His weight is stable. He can lie flat. He has a dry cough. No fever or chills. Written report of CXR done on Friday showed increased interstitial markings.  All other systems were reviewed and are negative.  Physical Exam: BP 100/72  Pulse 76  Ht 5\' 7"  (1.702 m)  Wt 183 lb (83.008 kg)  BMI 28.66 kg/m2  SpO2 93% Oxygen sats down to 79% on RA.  Up to 90 on 2l of oxygen and up to 93% with 4l of oxygen Patient is very pleasant and in moderate distress. He is short of breath with just talking. Skin is warm and dry. Color is normal.  HEENT is unremarkable. Normocephalic/atraumatic. PERRL. Sclera are nonicteric. Neck is supple. No masses. JVD is mildly increased. Lungs show diffuse rales. Cardiac exam shows a regular rate and rhythm. Abdomen is soft. Extremities are without edema. His fingers and lips are blue and cool to touch. Gait and ROM are intact. No gross neurologic deficits noted.  LABORATORY DATA: EKG with new anterolateral changes   Assessment / Plan:

## 2011-04-11 DIAGNOSIS — J449 Chronic obstructive pulmonary disease, unspecified: Secondary | ICD-10-CM

## 2011-04-11 DIAGNOSIS — R0902 Hypoxemia: Secondary | ICD-10-CM

## 2011-04-11 DIAGNOSIS — J684 Chronic respiratory conditions due to chemicals, gases, fumes and vapors: Secondary | ICD-10-CM

## 2011-04-11 DIAGNOSIS — I369 Nonrheumatic tricuspid valve disorder, unspecified: Secondary | ICD-10-CM

## 2011-04-11 LAB — BASIC METABOLIC PANEL
BUN: 21 mg/dL (ref 6–23)
Chloride: 103 mEq/L (ref 96–112)
GFR calc non Af Amer: 57 mL/min — ABNORMAL LOW (ref >60)
Glucose, Bld: 89 mg/dL (ref 70–99)
Potassium: 2.9 mEq/L — ABNORMAL LOW (ref 3.5–5.1)

## 2011-04-11 LAB — CARDIAC PANEL(CRET KIN+CKTOT+MB+TROPI): CK, MB: 1.9 ng/mL (ref 0.3–4.0)

## 2011-04-12 DIAGNOSIS — I5023 Acute on chronic systolic (congestive) heart failure: Secondary | ICD-10-CM

## 2011-04-12 LAB — RHEUMATOID FACTOR: Rhuematoid fact SerPl-aCnc: <10 IU/mL (ref <=14)

## 2011-04-12 LAB — PRO B NATRIURETIC PEPTIDE: Pro B Natriuretic peptide (BNP): 1368 pg/mL — ABNORMAL HIGH (ref 0–125)

## 2011-04-12 LAB — C-REACTIVE PROTEIN: CRP: 7.2 mg/dL — ABNORMAL HIGH (ref <0.6)

## 2011-04-12 LAB — BASIC METABOLIC PANEL
CO2: 23 mEq/L (ref 19–32)
Chloride: 103 mEq/L (ref 96–112)
Sodium: 138 mEq/L (ref 135–145)

## 2011-04-13 DIAGNOSIS — N289 Disorder of kidney and ureter, unspecified: Secondary | ICD-10-CM

## 2011-04-13 LAB — BASIC METABOLIC PANEL
CO2: 26 mEq/L (ref 19–32)
Calcium: 9.3 mg/dL (ref 8.4–10.5)
Chloride: 102 mEq/L (ref 96–112)
Sodium: 139 mEq/L (ref 135–145)

## 2011-04-13 LAB — CBC
Platelets: 243 10*3/uL (ref 150–400)
RBC: 4.38 MIL/uL (ref 4.22–5.81)
WBC: 10.4 10*3/uL (ref 4.0–10.5)

## 2011-04-14 DIAGNOSIS — I509 Heart failure, unspecified: Secondary | ICD-10-CM

## 2011-04-14 LAB — BASIC METABOLIC PANEL
CO2: 27 mEq/L (ref 19–32)
GFR calc non Af Amer: 29 mL/min — ABNORMAL LOW (ref >60)
Glucose, Bld: 215 mg/dL — ABNORMAL HIGH (ref 70–99)
Potassium: 4.7 mEq/L (ref 3.5–5.1)
Sodium: 138 mEq/L (ref 135–145)

## 2011-04-15 LAB — BASIC METABOLIC PANEL
BUN: 46 mg/dL — ABNORMAL HIGH (ref 6–23)
Chloride: 105 mEq/L (ref 96–112)
GFR calc Af Amer: 47 mL/min — ABNORMAL LOW (ref >60)
Potassium: 4.3 mEq/L (ref 3.5–5.1)

## 2011-04-16 ENCOUNTER — Telehealth: Payer: Self-pay | Admitting: Internal Medicine

## 2011-04-16 NOTE — Telephone Encounter (Signed)
Per [name] - pt is unable to obtain/prepare food.  Home Health requesting an order for Social Worker to come out for evaluation and assistance in getting the pt food.  Verbal  Order given.

## 2011-04-16 NOTE — Telephone Encounter (Signed)
Pt needs order for resource/food and home services.

## 2011-04-17 ENCOUNTER — Telehealth: Payer: Self-pay | Admitting: Critical Care Medicine

## 2011-04-17 ENCOUNTER — Telehealth: Payer: Self-pay | Admitting: Internal Medicine

## 2011-04-17 NOTE — Telephone Encounter (Signed)
I spoke with Bjorn Loser, the patient's home health nurse. She states when she saw the patient today, his O2 sats at rest were 93% on 3L. When she walked him, he dropped to 81% on 3L. She states he did not seem like he was in distress. She called the pulmonary group and they advised her to call us since his appointment with Dr. Sherene Sires is not until 05/04/11. I will review with [name] and call the patient back.

## 2011-04-17 NOTE — Telephone Encounter (Signed)
Home  Health was at  Patients home and his O2 is 81% when exercising.  She also wants to know what his O2 should be at.  Please call her back as soon as possible. She tried to call [name] but was told to call here.

## 2011-04-17 NOTE — Telephone Encounter (Signed)
[name] aware pt has not been seen in our office yet. Pt scheduled for HFU on 8/3 with [name]. Discharge summary from 04/15/2011 states O2 should be on 3 liters as outpatient. I asked that she fax all O2 sat documentation to our office so we could consult with another physician since [name] is out today. [name] did not have access to a fax machine at this time but will call [name] to get any recs regarding pt's oxygen until pt is seen in this office.

## 2011-04-17 NOTE — Telephone Encounter (Signed)
[name]  I will ask Dr Sherene Sires as to whether this issue would be more apptorpiralelty addressed by him  thx steve

## 2011-04-17 NOTE — Telephone Encounter (Signed)
Will need to be seen this week as never saw pulmonary before and has extensive ild on ct chest

## 2011-04-18 NOTE — Telephone Encounter (Signed)
Called, spoke with [name], pt's home health nurse.  She was advised MW recs pt needs to be seen this week for this.  She verbalized understanding of this and is requesting I call pt to scheduled appt.    Called, spoke with pt.  He is aware [name] called regarding his o2.  He was informed he will need to come in this week so we can figure this out per MW and [name] requesting I call him to schedule this appt.  I offered OV today but pt unable to make this - states portable o2 is supposed to be delivered sometime today.  Appt scheduled for tomorrow, July 19 at 2:30 with TP -- pt aware.

## 2011-04-19 ENCOUNTER — Inpatient Hospital Stay: Payer: Medicare Other | Admitting: Adult Health

## 2011-04-19 ENCOUNTER — Encounter: Payer: PRIVATE HEALTH INSURANCE | Admitting: *Deleted

## 2011-04-20 NOTE — Consult Note (Signed)
NAMEHARSHAN, [name] NO.:  [id number]  MEDICAL RECORD NO.:  [id number]  LOCATION:  3739                         FACILITY:  [facility]  PHYSICIAN:  Felipa Evener, MD  DATE OF BIRTH:  [birth year]  DATE OF CONSULTATION:  04/11/2011 DATE OF DISCHARGE:                                CONSULTATION   REASON FOR CONSULTATION:  Shortness of breath and changes on chest CT.  PHYSICIAN REQUESTING CONSULT:  Duke Salvia, MD, [facility]  HISTORY OF PRESENT ILLNESS:  Mr. Winebarger is a 75 year old male with extensive smoking history who presented on April 10, 2011, with progressive shortness of breath.  He was initially admitted by Dr. Sherryl Manges as the patient has extensive cardiac history including ischemic cardiomyopathy with an EF of 25%.  Chest x-ray and CT showed increased interstitial markings and extensive alveolar and interstitial infiltrates, and [name] asked Pulmonary Critical Care to assess the patient for questionable pulmonary fibrosis.  ALLERGIES:  The patient is allergic to SULFA.  PAST MEDICAL HISTORY: 1. Chronic systolic CHF, most recent ejection fraction 45%. 2. CAD status post CABG. 3. Hypertension. 4. Obesity. 5. CVA in 2009. 6. AAA. 7. Ischemic cardiomyopathy.  HOME MEDICATIONS:  Benzonatate, multivitamins, losartan, Bystolic, Plavix, furosemide, fluoxetine, isosorbide, Zetia, Nexium, Lipitor.  SOCIAL HISTORY:  The patient lives alone.  He is a recent widower.  He has a 75-pack-year history of smoking.  FAMILY HISTORY:  Noncontributory.  REVIEW OF SYSTEMS:  The patient does complain of some intermittent shortness of breath, occasional dry cough.  Denies fevers, chills, chest pain, or hemoptysis.  All other systems were reviewed and were negative.  PHYSICAL EXAMINATION:  VITAL SIGNS:  Temperature 98.3, blood pressure 100/65, heart rate 70, respiratory rate 18, O2 sats are 93% on 4 liters. GENERAL:  This is a chronically ill-appearing male, in  no acute distress.  He is alert and oriented. NEUROLOGIC:  Intact. HEENT:  Mucous membranes are moist.  Pupils are equal, round, and reactive to light. NECK:  Supple without lymphadenopathy, no thyromegaly.  No bruit.  No JVD. CARDIAC:  S1 and S2, regular rate and rhythm.  No murmurs, rubs, or gallops. PULMONARY:  Respirations are even and nonlabored, diffuse rales and end- expiratory wheezes. ABDOMEN:  Soft, nontender, nondistended. SKIN:  Warm and dry.  No rashes or lesions.  No edema.  RADIOLOGY DATA:  CT of the chest is negative for pulmonary embolism, it does show interstitial infiltrates, bronchiectasis, and mediastinal, and hilar adenopathy.  LABORATORY DATA:  Basic metabolic panel:  Sodium 141, potassium 2.9, BUN 21, creatinine 1.21, glucose 89.  CBC:  White blood cells 8.8, hemoglobin 13.7, hematocrit 40.7, and platelets 220.  BNP is 1787.  Micro:  No micro is available this admission.  IMPRESSION AND PLAN:  This is a 75 year old male with likely pulmonary fibrosis as well as chronic obstructive pulmonary disease.  He does not appear acutely infected.  He is significantly hypoxic on room air but does recover quickly with supplemental oxygen which he will likely need at discharge.  Recommend empiric Avelox 400 mg p.o. daily x5 days and a short prednisone burst 50 mg p.o. daily x5 days.  We will start him  on DuoNeb via flutter valve while in the hospital and transition to Combivent at discharge, albuterol nebs p.r.n.  We will have nursing do an ambulatory desat study to determine need for home O2.  We will start Advair 250/50 one puff b.i.d.  We will check an ESR, CRP, rheumatoid factor, ANA, and ACE level and work with the patient on aggressive pulmonary hygiene.  We will continue to follow this patient in the hospital, and we will also follow him in the outpatient setting.  He will see Dr. Shan Levans on May 04, 2011, at 10:30 a.m.     Dirk Dress,  NP   ______________________________ Felipa Evener, MD    KW/MEDQ  D:  04/12/2011  T:  04/12/2011  Job:  [id number]  cc:   Charlcie Cradle. Delford Field, MD, [facility]  Electronically Signed by Danford Bad N.P. on 04/19/2011 07:58:11 AM Electronically Signed by Koren Bound MD on 04/20/2011 10:38:51 AM

## 2011-04-23 ENCOUNTER — Encounter: Payer: Self-pay | Admitting: *Deleted

## 2011-04-23 ENCOUNTER — Other Ambulatory Visit: Payer: Self-pay | Admitting: *Deleted

## 2011-04-23 MED ORDER — EZETIMIBE 10 MG PO TABS: 10.0000 mg | ORAL_TABLET | Freq: Every day | ORAL | Status: DC

## 2011-04-23 MED ORDER — LOSARTAN POTASSIUM 50 MG PO TABS: 25.0000 mg | ORAL_TABLET | Freq: Two times a day (BID) | ORAL | Status: DC

## 2011-04-23 NOTE — Telephone Encounter (Signed)
escribe medication per fax request  

## 2011-04-23 NOTE — Discharge Summary (Signed)
[name], [name] NO.:  [id number]  MEDICAL RECORD NO.:  [id number]  LOCATION:  3739                         FACILITY:  [facility]  PHYSICIAN:  Noralyn Pick. Eden Emms, MD, FACCDATE OF BIRTH:  Jun 06, 1936  DATE OF ADMISSION:  04/10/2011 DATE OF DISCHARGE:  04/15/2011                              DISCHARGE SUMMARY   PRIMARY CARDIOLOGIST:  Duke Salvia, MD, [facility]  PRIMARY CARE PHYSICIAN:  Not listed.  PULMONOLOGIST: 1. Felipa Evener, MD 2. Charlcie Cradle Delford Field, MD, [facility]  PROCEDURES PERFORMED DURING HOSPITALIZATION: 1. Echocardiogram.     a.     Inferobasal left ventricular hypokinesis, endocardium poorly      visualized, cavity size mildly dilated, wall thickness was      increased in the pattern of severe LVH, systolic function mildly      reduced with an EF of 45-50%.  FINAL DISCHARGE DIAGNOSES: 1. Congestive heart failure. 2. Respiratory failure. a:  With hypoxemia. 1. History of ischemic cardiomyopathy with an ejection fraction of     24%. 2. Status post redo coronary artery bypass grafting in 1997. 3. Hypertension. 4. Hyperlipidemia. 5. Obesity. 6. History of cerebrovascular accident in 2009. 7. History of abdominal aortic aneurysm. 8. Newly diagnosed pulmonary fibrosis with chronic obstructive     pulmonary disease.  HOSPITAL COURSE:  This is a 75-year male patient formerly of Dr. Deborah Chalk who is now being seen by [name] who actually was seen by Norma Fredrickson, nurse practitioner in the office on followup as the patient saw her for shortness of breath.  He had a history of ischemic cardiomyopathy with an ICD placement in the past.  The patient was seen because of progressively worsening shortness of breath, extreme dyspnea with low O2 sats.  He was seen and evaluated by Ms. Tyrone Sage and felt that the patient needed be admitted.  This was discussed with Dr. Swazil[place].  It was felt that the patient may have a PE and therefore he was admitted for  further evaluation and testing.  The patient did have a CT scan that was negative for acute pulmonary emboli.  He had cardiomegaly with enlargement of the pulmonary artery suggesting pulmonary arterial hypertension.  He had extensive interstitial and alveolar infiltrates primarily in both lung bases superimposed on chronic interstitial and obstructive lung disease as well as bronchiectasis.  He had some mild mediastinal and hilar adenopathy which is most likely reactive adenopathy due to inflammatory changes in the lungs.  Incidentally, it was noted that he had cholelithiasis.  As a result of this, the patient had consultation by Dr. Molli Knock, Critical Care Pulmonary for further evaluation of his lung status.  The patient was diagnosed with likely pulmonary fibrosis and emphysema along with chronic bronchitis.  He was not found to have any acute infection.  He was placed on prednisone and Avelox along with DuoNeb and breathing treatments to include albuterol and Atrovent.  He was also started on Advair 250/50 two puffs b.i.d.  The patient remained on oxygen throughout hospitalization.  The patient was seen by Physical Therapy and Occupational Therapy.  He was unable to do a lot of physical activity secondary to hypoxemia with sats dropping into  83%, although he had been on 4 liters of oxygen.  The patient did not continue with physical therapy.  The patient was followed by Dr. Hillis Range and was diuresed on IV Lasix secondary to systolic dysfunction and CHF.  The patient lost approximately 1 kg during hospitalization and was breathing better. However, he remained oxygen dependent.  The patient was seen and examined by Dr. Charlton Haws and was found that the patient will likely need to be on home O2 prior to discharge.  Home Health was consulted and [facility] was seen by the patient who did set up for home oxygenation.  He will remain on 3 meters O2 as an outpatient.   The patient was placed on p.o. Lasix for a short period of time, but this was discontinued secondary to hypotension.  The patient remained stable, was seen by Dr. Eden Emms on day of discharge, and will have followup appointment with Dr. Sherene Sires and [name] on discharge.  The patient was on prednisone for 5 days and this was discontinued after a 5-day course.  His Lasix was also discontinued as he had a normal EF to avoid dehydration.  DISCHARGE LABORATORY DATA:  Sodium 140, potassium 4.3, chloride 105, CO2 of 26, glucose 159, BUN 46, creatinine 1.73, and calcium 9.3. Hemoglobin 12.5, hematocrit 38.4, white blood cells 10.4, and platelets 243.  ANA was found to be negative.  Rheumatoid factor was less than 10. C-reactive protein was elevated at 7.2.  Pro-BNP 1368.  ESR was elevated at 40.  Cardiac enzymes were found to be negative at less than 0.30 respectively x3.  PTT 33, PT 15.6, and INR 1.20.  RADIOLOGY: 1. Chest x-ray dated April 10, 2011, revealing new pulmonary vascular     congestion with chronic cardiomegaly, increased interstitial     accentuation of the lung bases which could represent progressive     chronic interstitial disease or more acute interstitial     pneumonitis. 2. CT scan of the chest as discussed above, no acute pulmonary emboli,     with cardiomegaly and enlargement of the pulmonary artery     suggesting pulmonary arterial hypertension.  VITAL SIGNS ON DISCHARGE:  Blood pressure 117/74, pulse 74, respirations 20, temperature 97.3, and O2 sat 97% on 4 liters.  Echocardiogram was completed on April 11, 2011, as discussed above. Please see full echocardiogram report for more details.  DISCHARGE MEDICATIONS: 1. Albuterol 2.5 mg inhaled t.i.d. 2. Albuterol 2.5 mg nebulizer solutions q.4 h. p.r.n. 3. Antiseptic rinse with Biotene twice daily. 4. Aspirin 81 mg by mouth daily. 5. Fluticasone and salmeterol 250/50 one puff b.i.d. 6. Atrovent inhaled t.i.d. 7.  Pantoprazole 40 mg 2 tablets daily. 8. Benzonatate 100 mg capsule t.i.d. 9. Bystolic 10 mg by mouth daily. 10.Fluoxetine 20 mg by mouth daily. 11.Isosorbide dinitrate 30 mg by mouth daily. 12.Lipitor 40 mg tablet by mouth daily. 13.Losartan 50 mg 1 tablet by mouth daily. 14.Multivitamin 1 tablet by mouth daily. 15.Plavix 75 mg by mouth daily. 16.Zetia 10 mg by mouth daily.  The patient is advised to stop taking Nexium 40 mg daily and Lasix 40 mg daily.  ALLERGIES:  SULFA.  FOLLOWUP PLANS AND APPOINTMENT: 1. The patient will see Dr. Shan Levans on May 04, 2011, at 10:30     a.m. 2. The patient will follow up with Dr. Sherryl Manges in the office.     Our office will call to make that appointment. 3. The patient is having home health services  at [facility] to     provide oxygen.  This will come from [facility] at [phone]     for continuous O2 at 3 liters. 4. The patient is advised to bring all medications to followup     appointment.  Time spent with the patient to include physician time is 45 minutes.     Bettey Mare. Lyman Bishop, NP   ______________________________ Noralyn Pick Eden Emms, MD, [facility]    KML/MEDQ  D:  04/15/2011  T:  04/15/2011  Job:  [id number]  cc:   Felipa Evener, MD Charlcie Cradle. Delford Field, MD, [facility]  Electronically Signed by Joni Reining NP on 04/19/2011 04:29:54 PM Electronically Signed by Charlton Haws MD [facility] on 04/23/2011 10:16:00 PM

## 2011-04-26 ENCOUNTER — Encounter: Payer: Self-pay | Admitting: Physician Assistant

## 2011-05-02 ENCOUNTER — Encounter: Payer: Medicare Other | Admitting: Physician Assistant

## 2011-05-04 ENCOUNTER — Ambulatory Visit (INDEPENDENT_AMBULATORY_CARE_PROVIDER_SITE_OTHER): Payer: Medicare Other | Admitting: Critical Care Medicine

## 2011-05-04 ENCOUNTER — Encounter: Payer: Self-pay | Admitting: Critical Care Medicine

## 2011-05-04 VITALS — BP 118/76 | HR 70 | Temp 97.7°F | Ht 67.0 in | Wt 182.6 lb

## 2011-05-04 DIAGNOSIS — J438 Other emphysema: Secondary | ICD-10-CM

## 2011-05-04 DIAGNOSIS — J439 Emphysema, unspecified: Secondary | ICD-10-CM | POA: Insufficient documentation

## 2011-05-04 NOTE — Patient Instructions (Signed)
No change in medications. Return in         3 months Stay on 4Liter oxygen Will obtain a humidifier and portable oxygen system

## 2011-05-04 NOTE — Progress Notes (Signed)
Subjective:    Patient ID: [name], male    DOB: 02/08/1936, 75 y.o.   MRN: [id number]  HPI 75 [facility].o.[facility] 7/10 -04/15/11 for CHF, resp failure, Ischemic CM 45-50%   COPD/ Pulm Fibrosis  D/c on oxygen.  CT neg for PE,  pulm infiltrates/fibrosis Since d/c home a few days,  D/c 3L  Was on 4L in hosp.  Now on 4L on oxygen.  Still does not have humidification. Almost went back to the hospital.     Now slowly recovering.  No edema in feet.  No cough.  No real chest pain .   Past Medical History  Diagnosis Date  . Clotting disorder     2002 -- CABG x4  . Hypertension   . Hyperlipidemia   . Ischemic cardiomyopathy   . Systolic heart failure     EF is 24%  . History of atherosclerotic cardiovascular disease   . ICD (implantable cardiac defibrillator) in place 2010  . Hypertensive heart disease      Family History  Problem Relation Age of Onset  . Heart attack Brother   . Coronary artery disease Brother   . Coronary artery disease Brother   . Heart disease Brother      History   Social History  . Marital Status: Married    Spouse Name: N/A    Number of Children: N/A  . Years of Education: N/A   Occupational History  . Not on file.   Social History Main Topics  . Smoking status: Former Smoker -- 1.0 packs/day for 47 years    Types: Cigarettes    Quit date: 03/12/2003  . Smokeless tobacco: Never Used  . Alcohol Use: No  . Drug Use: No  . Sexually Active: No   Other Topics Concern  . Not on file   Social History Narrative  . No narrative on file     Allergies  Allergen Reactions  . Sulfonamide Derivatives      Outpatient Prescriptions Prior to Visit  Medication Sig Dispense Refill  . atorvastatin (LIPITOR) 40 MG tablet Take 40 mg by mouth daily.        . benzonatate (TESSALON) 100 MG capsule Take 100 mg by mouth 3 (three) times daily.        . clopidogrel (PLAVIX) 75 MG tablet Take 75 mg by mouth daily.        Marland Kitchen ezetimibe (ZETIA) 10 MG tablet Take 1  tablet (10 mg total) by mouth daily.  30 tablet  6  . FLUoxetine (PROZAC) 40 MG capsule Take 20 mg by mouth daily.       . isosorbide dinitrate (ISORDIL) 30 MG tablet Take 30 mg by mouth daily.       Marland Kitchen losartan (COZAAR) 50 MG tablet Take 0.5 tablets (25 mg total) by mouth 2 (two) times daily.  30 tablet  6  . multivitamin (THERAGRAN) per tablet Take 1 tablet by mouth daily.        . nebivolol (BYSTOLIC) 10 MG tablet Take 10 mg by mouth daily.        Marland Kitchen esomeprazole (NEXIUM) 40 MG packet Take 40 mg by mouth daily before breakfast.        . furosemide (LASIX) 40 MG tablet Take 40 mg by mouth 2 (two) times daily.            Review of Systems Constitutional:   No  weight loss, night sweats,  Fevers, chills, fatigue, lassitude. HEENT:  No headaches,  Difficulty swallowing,  Tooth/dental problems,  Sore throat,                No sneezing, itching, ear ache, nasal congestion, post nasal drip,   CV:  No chest pain,  Orthopnea, PND, swelling in lower extremities, anasarca, dizziness, palpitations  GI  No heartburn, indigestion, abdominal pain, nausea, vomiting, diarrhea, change in bowel habits, loss of appetite  Resp: Notes  shortness of breath with exertion not  at rest.  No excess mucus, no productive cough,  No non-productive cough,  No coughing up of blood.  No change in color of mucus.  No wheezing.  No chest wall deformity  Skin: no rash or lesions.  GU: no dysuria, change in color of urine, no urgency or frequency.  No flank pain.  MS:  No joint pain or swelling.  No decreased range of motion.  No back pain.  Psych:  No change in mood or affect. No depression or anxiety.  No memory loss.     Objective:   Physical Exam Filed Vitals:   05/04/11 1037  BP: 118/76  Pulse: 70  Temp: 97.7 F (36.5 C)  TempSrc: Oral  Height: 5\' 7"  (1.702 m)  Weight: 182 lb 9.6 oz (82.827 kg)  SpO2: 93%    Gen: Pleasant, well-nourished, in no distress,  normal affect  ENT: No lesions,  mouth  clear,  oropharynx clear, no postnasal drip  Neck: No JVD, no TMG, no carotid bruits  Lungs: No use of accessory muscles, no dullness to percussion, distant BS  Cardiovascular: RRR, heart sounds normal, no murmur or gallops, no peripheral edema  Abdomen: soft and NT, no HSM,  BS normal  Musculoskeletal: No deformities, no cyanosis or clubbing  Neuro: alert, non focal  Skin: Warm, no lesions or rashes        Assessment & Plan:   COPD with emphysema REcent CHF acute systolic flare with cop exacerbation Plan No change in inhaled or maintenance medications. Return in  4 mo Cont oxygen as prescribed    Updated Medication List Outpatient Encounter Prescriptions as of 05/04/2011  Medication Sig Dispense Refill  . albuterol (PROVENTIL) (2.5 MG/3ML) 0.083% nebulizer solution Take 2.5 mg by nebulization 3 (three) times daily.        Marland Kitchen aspirin 81 MG tablet Take 81 mg by mouth daily.        Marland Kitchen atorvastatin (LIPITOR) 40 MG tablet Take 40 mg by mouth daily.        . benzonatate (TESSALON) 100 MG capsule Take 100 mg by mouth 3 (three) times daily.        . clopidogrel (PLAVIX) 75 MG tablet Take 75 mg by mouth daily.        . Dentifrices (BIOTENE DRY MOUTH CARE DT) 2 (two) times daily.        Marland Kitchen ezetimibe (ZETIA) 10 MG tablet Take 1 tablet (10 mg total) by mouth daily.  30 tablet  6  . FLUoxetine (PROZAC) 40 MG capsule Take 20 mg by mouth daily.       . Fluticasone-Salmeterol (ADVAIR DISKUS) 250-50 MCG/DOSE AEPB Inhale 1 puff into the lungs every 12 (twelve) hours.        Marland Kitchen ipratropium (ATROVENT) 0.02 % nebulizer solution Take 500 mcg by nebulization 3 (three) times daily.        . isosorbide dinitrate (ISORDIL) 30 MG tablet Take 30 mg by mouth daily.       Marland Kitchen losartan (COZAAR) 50 MG  tablet Take 0.5 tablets (25 mg total) by mouth 2 (two) times daily.  30 tablet  6  . multivitamin (THERAGRAN) per tablet Take 1 tablet by mouth daily.        . nebivolol (BYSTOLIC) 10 MG tablet Take 10 mg by  mouth daily.        . pantoprazole (PROTONIX) 40 MG tablet Take 40 mg by mouth daily.        Marland Kitchen DISCONTD: esomeprazole (NEXIUM) 40 MG packet Take 40 mg by mouth daily before breakfast.        . DISCONTD: furosemide (LASIX) 40 MG tablet Take 40 mg by mouth 2 (two) times daily.

## 2011-05-05 NOTE — Assessment & Plan Note (Signed)
REcent CHF acute systolic flare with cop exacerbation Plan No change in inhaled or maintenance medications. Return in  4 mo Cont oxygen as prescribed

## 2011-05-10 ENCOUNTER — Telehealth: Payer: Self-pay | Admitting: Critical Care Medicine

## 2011-05-10 NOTE — Telephone Encounter (Signed)
Spoke with [name] with [facility]. He states that he is needing PW to sign the order he faxed last wk for eval of liquid o2. He states that he has also faxed over information regarding pt's o2 eval today for PW to review. Please advise if you did not receive and I will have him refax, thanks!

## 2011-05-11 NOTE — Telephone Encounter (Signed)
Did not find/see the o2 order to sign I did see the best fit report

## 2011-05-11 NOTE — Telephone Encounter (Signed)
I spoke with Theodoro Grist and advised to refax order to sign to triage fax. I will give to PW once received.Carron Curie, CMA

## 2011-05-14 NOTE — Telephone Encounter (Signed)
Crystal, please advise if you have seen this or not?  Thanks.

## 2011-05-16 NOTE — Telephone Encounter (Signed)
Fax received and placed in PW's to do.

## 2011-05-22 NOTE — Telephone Encounter (Signed)
Form signed by PW and faxed back to [phone] and placed in PW's scan folder.  Theodoro Grist with [facility] aware.

## 2011-05-22 NOTE — Telephone Encounter (Signed)
Crystal will you follow up on same? Thanks

## 2011-05-28 ENCOUNTER — Inpatient Hospital Stay (HOSPITAL_COMMUNITY)
Admission: EM | Admit: 2011-05-28 | Discharge: 2011-06-03 | DRG: 291 | Disposition: A | Discharge status: Home Health | Payer: Medicare Other | Source: Ambulatory Visit | Attending: Cardiology | Admitting: Cardiology

## 2011-05-28 ENCOUNTER — Emergency Department (HOSPITAL_COMMUNITY): Payer: Medicare Other

## 2011-05-28 DIAGNOSIS — R0609 Other forms of dyspnea: Secondary | ICD-10-CM

## 2011-05-28 DIAGNOSIS — I5023 Acute on chronic systolic (congestive) heart failure: Secondary | ICD-10-CM

## 2011-05-28 DIAGNOSIS — Z79899 Other long term (current) drug therapy: Secondary | ICD-10-CM

## 2011-05-28 DIAGNOSIS — I714 Abdominal aortic aneurysm, without rupture, unspecified: Secondary | ICD-10-CM | POA: Diagnosis present

## 2011-05-28 DIAGNOSIS — R0989 Other specified symptoms and signs involving the circulatory and respiratory systems: Secondary | ICD-10-CM

## 2011-05-28 DIAGNOSIS — Z7982 Long term (current) use of aspirin: Secondary | ICD-10-CM

## 2011-05-28 DIAGNOSIS — Z882 Allergy status to sulfonamides status: Secondary | ICD-10-CM

## 2011-05-28 DIAGNOSIS — I10 Essential (primary) hypertension: Secondary | ICD-10-CM | POA: Diagnosis present

## 2011-05-28 DIAGNOSIS — I428 Other cardiomyopathies: Secondary | ICD-10-CM | POA: Diagnosis present

## 2011-05-28 DIAGNOSIS — I2510 Atherosclerotic heart disease of native coronary artery without angina pectoris: Secondary | ICD-10-CM | POA: Diagnosis present

## 2011-05-28 DIAGNOSIS — Z87891 Personal history of nicotine dependence: Secondary | ICD-10-CM

## 2011-05-28 DIAGNOSIS — I2589 Other forms of chronic ischemic heart disease: Secondary | ICD-10-CM | POA: Diagnosis present

## 2011-05-28 DIAGNOSIS — Z95810 Presence of automatic (implantable) cardiac defibrillator: Secondary | ICD-10-CM

## 2011-05-28 DIAGNOSIS — J8410 Pulmonary fibrosis, unspecified: Secondary | ICD-10-CM | POA: Diagnosis present

## 2011-05-28 DIAGNOSIS — K449 Diaphragmatic hernia without obstruction or gangrene: Secondary | ICD-10-CM | POA: Diagnosis present

## 2011-05-28 DIAGNOSIS — Z8673 Personal history of transient ischemic attack (TIA), and cerebral infarction without residual deficits: Secondary | ICD-10-CM

## 2011-05-28 DIAGNOSIS — Z951 Presence of aortocoronary bypass graft: Secondary | ICD-10-CM

## 2011-05-28 DIAGNOSIS — I509 Heart failure, unspecified: Secondary | ICD-10-CM | POA: Diagnosis present

## 2011-05-28 DIAGNOSIS — N289 Disorder of kidney and ureter, unspecified: Secondary | ICD-10-CM | POA: Diagnosis present

## 2011-05-28 DIAGNOSIS — E785 Hyperlipidemia, unspecified: Secondary | ICD-10-CM | POA: Diagnosis present

## 2011-05-28 DIAGNOSIS — J9620 Acute and chronic respiratory failure, unspecified whether with hypoxia or hypercapnia: Secondary | ICD-10-CM | POA: Diagnosis present

## 2011-05-28 DIAGNOSIS — J438 Other emphysema: Secondary | ICD-10-CM | POA: Diagnosis present

## 2011-05-28 DIAGNOSIS — Z9981 Dependence on supplemental oxygen: Secondary | ICD-10-CM

## 2011-05-28 LAB — DIFFERENTIAL
Basophils Absolute: 0 10*3/uL (ref 0.0–0.1)
Basophils Relative: 0 % (ref 0–1)
Eosinophils Absolute: 0.1 10*3/uL (ref 0.0–0.7)
Monocytes Relative: 8 % (ref 3–12)
Neutro Abs: 10.2 10*3/uL — ABNORMAL HIGH (ref 1.7–7.7)
Neutrophils Relative %: 84 % — ABNORMAL HIGH (ref 43–77)

## 2011-05-28 LAB — CBC
Hemoglobin: 11.9 g/dL — ABNORMAL LOW (ref 13.0–17.0)
MCH: 27.5 pg (ref 26.0–34.0)
Platelets: 189 10*3/uL (ref 150–400)
RBC: 4.33 MIL/uL (ref 4.22–5.81)
WBC: 12.1 10*3/uL — ABNORMAL HIGH (ref 4.0–10.5)

## 2011-05-28 LAB — POCT I-STAT TROPONIN I: Troponin i, poc: 0.02 ng/mL (ref 0.00–0.08)

## 2011-05-28 LAB — COMPREHENSIVE METABOLIC PANEL
AST: 23 U/L (ref 0–37)
Albumin: 3.1 g/dL — ABNORMAL LOW (ref 3.5–5.2)
Alkaline Phosphatase: 39 U/L (ref 39–117)
BUN: 20 mg/dL (ref 6–23)
Potassium: 3.9 mEq/L (ref 3.5–5.1)
Total Protein: 6.9 g/dL (ref 6.0–8.3)

## 2011-05-28 LAB — PRO B NATRIURETIC PEPTIDE: Pro B Natriuretic peptide (BNP): 5882 pg/mL — ABNORMAL HIGH (ref 0–125)

## 2011-05-28 LAB — PROTIME-INR: Prothrombin Time: 15 seconds (ref 11.6–15.2)

## 2011-05-28 LAB — OCCULT BLOOD, POC DEVICE: Fecal Occult Bld: NEGATIVE

## 2011-05-29 ENCOUNTER — Inpatient Hospital Stay (HOSPITAL_COMMUNITY): Payer: Medicare Other

## 2011-05-29 LAB — CBC
HCT: 34.4 % — ABNORMAL LOW (ref 39.0–52.0)
Hemoglobin: 11.1 g/dL — ABNORMAL LOW (ref 13.0–17.0)
MCH: 27.9 pg (ref 26.0–34.0)
MCHC: 32.3 g/dL (ref 30.0–36.0)

## 2011-05-29 LAB — BASIC METABOLIC PANEL
CO2: 28 mEq/L (ref 19–32)
Chloride: 100 mEq/L (ref 96–112)
Glucose, Bld: 96 mg/dL (ref 70–99)
Potassium: 3.6 mEq/L (ref 3.5–5.1)
Sodium: 141 mEq/L (ref 135–145)

## 2011-05-29 LAB — CARDIAC PANEL(CRET KIN+CKTOT+MB+TROPI)
CK, MB: 1.9 ng/mL (ref 0.3–4.0)
CK, MB: 1.9 ng/mL (ref 0.3–4.0)
Relative Index: INVALID (ref 0.0–2.5)
Total CK: 39 U/L (ref 7–232)
Total CK: 33 U/L (ref 7–232)
Troponin I: <0.3 ng/mL (ref <0.30)
Troponin I: <0.3 ng/mL (ref <0.30)

## 2011-05-30 LAB — BASIC METABOLIC PANEL
CO2: 33 mEq/L — ABNORMAL HIGH (ref 19–32)
Calcium: 9.1 mg/dL (ref 8.4–10.5)
Creatinine, Ser: 1.16 mg/dL (ref 0.50–1.35)
GFR calc Af Amer: >60 mL/min (ref >60)

## 2011-05-30 LAB — PRO B NATRIURETIC PEPTIDE: Pro B Natriuretic peptide (BNP): 2715 pg/mL — ABNORMAL HIGH (ref 0–125)

## 2011-05-31 ENCOUNTER — Inpatient Hospital Stay (HOSPITAL_COMMUNITY): Payer: Medicare Other

## 2011-05-31 DIAGNOSIS — J9610 Chronic respiratory failure, unspecified whether with hypoxia or hypercapnia: Secondary | ICD-10-CM

## 2011-05-31 DIAGNOSIS — J449 Chronic obstructive pulmonary disease, unspecified: Secondary | ICD-10-CM

## 2011-05-31 DIAGNOSIS — I509 Heart failure, unspecified: Secondary | ICD-10-CM

## 2011-06-01 ENCOUNTER — Inpatient Hospital Stay (HOSPITAL_COMMUNITY): Payer: Medicare Other

## 2011-06-01 LAB — BASIC METABOLIC PANEL
Calcium: 9.3 mg/dL (ref 8.4–10.5)
GFR calc non Af Amer: 51 mL/min — ABNORMAL LOW (ref >60)
Glucose, Bld: 116 mg/dL — ABNORMAL HIGH (ref 70–99)
Sodium: 141 mEq/L (ref 135–145)

## 2011-06-02 ENCOUNTER — Inpatient Hospital Stay (HOSPITAL_COMMUNITY): Payer: Medicare Other

## 2011-06-02 LAB — BASIC METABOLIC PANEL
BUN: 37 mg/dL — ABNORMAL HIGH (ref 6–23)
GFR calc Af Amer: 54 mL/min — ABNORMAL LOW (ref >60)
GFR calc non Af Amer: 45 mL/min — ABNORMAL LOW (ref >60)
Potassium: 4.1 mEq/L (ref 3.5–5.1)

## 2011-06-03 DIAGNOSIS — J8410 Pulmonary fibrosis, unspecified: Secondary | ICD-10-CM

## 2011-06-03 LAB — BASIC METABOLIC PANEL
BUN: 34 mg/dL — ABNORMAL HIGH (ref 6–23)
Calcium: 9.6 mg/dL (ref 8.4–10.5)
Creatinine, Ser: 1.26 mg/dL (ref 0.50–1.35)
GFR calc Af Amer: >60 mL/min (ref >60)
GFR calc non Af Amer: 56 mL/min — ABNORMAL LOW (ref >60)
Potassium: 4.3 mEq/L (ref 3.5–5.1)

## 2011-06-05 ENCOUNTER — Ambulatory Visit: Payer: Medicare Other | Admitting: Nurse Practitioner

## 2011-06-07 ENCOUNTER — Telehealth: Payer: Self-pay | Admitting: Internal Medicine

## 2011-06-07 NOTE — Telephone Encounter (Signed)
Since it is after 6pm I will forward to Triage to see if they can call [facility] tomorrow.

## 2011-06-07 NOTE — Telephone Encounter (Signed)
[name] from [facility], BMT, pt next week; and nurse wanted to know if she could draw blood work and what specifically they needed from blood work. Please return call to discuss further.

## 2011-06-08 NOTE — Telephone Encounter (Signed)
Bjorn Loser, from [facility] is requesting an ok to draw a BMET when she is out at Mr Lunde' house on Tues.  She states there is an order from the hospital to do one at our office next week.  She states it is difficult for pt to get to our office and would be more convenient for him to have it done by her.  Please call if this is not ok.  [phone]

## 2011-06-12 ENCOUNTER — Encounter: Payer: Self-pay | Admitting: Internal Medicine

## 2011-06-16 NOTE — Discharge Summary (Signed)
[name], [name] NO.:  [id number]  MEDICAL RECORD NO.:  [id number]  LOCATION:  [facility]                         FACILITY:  [facility]  PHYSICIAN:  Marca Ancona, MD      DATE OF BIRTH:  1935-11-13  DATE OF ADMISSION:  05/28/2011 DATE OF DISCHARGE:                              DISCHARGE SUMMARY   PRIMARY CARDIOLOGIST:  Duke Salvia, MD, [facility].  PRIMARY CARE PROVIDER:  Lucila [name], MD  PRIMARY PULMONOLOGIST:  Charlcie Cradle. Delford Field, MD, FCCP.  PATIENT PROFILE:  The patient is a 75 year old male with history of CAD, ischemic cardiomyopathy, as well as pulmonary fibrosis, COPD on home O2, presents with progressive dyspnea.  PROBLEMS: 1. Multifactorial dyspnea. 2. Acute-on-chronic systolic congestive heart failure.     a.     Status post [employer] Virtuoso dual-chamber AICD, Feb 01, 2011.     b.     A 2-D echocardiogram, April 11, 2011, EF 45-50%, severe LVH,      and mildly dilated left atrium. 3. Coronary artery disease.     a.     Status post coronary bypass grafting remotely.     b.     Status post redo coronary bypass grafting in 1997.     c.     Status post redo coronary bypass grafting x2 in 2007 with      placement a left radial artery to the distal RCA, as well as vein      graft to the distal circumflex.     d.     January 07, 2009, cardiac catheterization, left main 100%, left      circumflex 100% proximal, old vein graft to the RCA 100%, old vein      graft to the RCA 90%, left radial artery to the RCA normal, vein      graft to circumflex normal, LIMA to the LAD normal, two old vein      grafts were stumped. 4. Carotid arterial disease.     a.     Status post ultrasound August 22, 2010, with 20-39%      bilateral carotid arterial stenosis. 5. History of CVA December 2009. 6. Hypertension. 7. Hyperlipidemia. 8. Hiatal hernia.  IMPRESSION: 1. Abdominal aortic aneurysm. 2. Pulmonary fibrosis/ chronic obstructive pulmonary diseases, on 6  liters home oxygen.  ALLERGIES:  SULFA.  HISTORY OF PRESENT ILLNESS:  This is a 75-old-male with the above complex problem list.  The patient was admitted to [facility] with progressive dyspnea in July of this year and was felt to exhibit signs of CHF, as well as COPD, and pulmonary fibrosis.  The patient was diuresed and also given a short course of prednisone, and subsequently placed on home O2, and discharged home.  Following discharge, the patient states he felt great, but over the past six weeks or so, he has had progressive dyspnea on exertion, and his home O2 has been increased from 4 liters per minute to 6 liters per minute.  He does not weigh himself at home, but based on the looseness of his clothing, he believes he has lost about 20 pounds over this period of time.  He denies PND, orthopnea, nausea, vomiting, early satiety edema, or chest pain.  Last p.m. he had worsening dyspnea on exertion and this persisted through the morning, and he therefore presented to [facility] ED.  Here chest x-ray suggests CHF, while his Pro BNP is greater than 5000.  He has been treated with IV Lasix, and he has begun to diurese, but he remains on a non-rebreather.  HOME MEDICATIONS: 1. Albuterol nebulized 2.5 mg t.i.d. 2. Aspirin 81 mg daily. 3. Lipitor 40 mg daily. 4. Tessalon Perles 100 mg t.i.d. 5. Plavix 75 mg daily. 6. Biotin daily. 7. Zetia 10 mg daily. 8. Prozac 20 mg daily. 9. Advair 250/50, 1 puff b.i.d. 10.Atrovent t.i.d. 11.Imdur 30 mg daily. 12.Cozaar 50 mg daily. 13.Multivitamin daily. 14.Bystolic 10 mg daily. 15.Protonix 40 mg daily. 16.Of note, the patient was on Lasix, but this was discontinued in     July secondary to normal LV function and concern for dehydration.  FAMILY HISTORY:  Mother died of CVA at 48, father died of a CVA at 14, brother died of an MI at 22.  SOCIAL HISTORY:  The patient lives in [place] and lives himself.  He is widowed 1 year ago.  He is  retired.  Previously, he smoked heavily from the ages of 46 to 71, and he quit in 2007 at the time of his redo bypass surgery.  He denies alcohol or drugs.  He is not routinely exercising.  REVIEW OF SYSTEMS:  He did have chills this morning.  He has noticed decreased urine volume over the past few days.  He denies chest pain. He has had dyspnea as outlined above.  He is full code.  Otherwise, all systems reviewed and negative.  PHYSICAL EXAMINATION: VITAL SIGNS: Temperature is recorded as 100 done her in the ER, his heart rate is 76, respirations 22, blood pressure 127/72, pulse ox was 89% and 100% on non-rebreather. GENERAL:  A pleasant white male,  in no acute distress.  Awake, , alert, and oriented x3.  He has a normal affect. HEENT:  Normal. NEURO:  Grossly intact, nonfocal. SKIN:  Warm and dry without lesions or masses. NECK:  Supple with JVP of approximately 12-13 cm.  No bruits. LUNGS:  Respirations are unlabored with crackles approximately halfway up bilaterally.  Diminished breath sounds. CARDIAC:  Regular S1 and S2.  No appreciable murmurs. ABDOMEN:  Round, soft, nontender, nondistended.  Bowel sounds present x4. EXTREMITIES:  Warm, dry, pink.  No clubbing, cyanosis, or edema. Dorsalis pedis, posterior tibial pulses 1+ bilaterally.  LABORATORY DATA:  Chest x-ray today shows CHF.  EKG shows sinus rhythm with atrial pacing on demand, rate of 81.  Normal axis.  He has anterolateral ST depression and T-wave inversion, which is old. Slightly more pronounced inferior ST depression today.  His lab work; hemoglobin 11.9, hematocrit 37.9, WBC 12.1, platelets 189.  Sodium 139, potassium 2.9, chloride 101, CO2 27, BUN 20, creatinine 1.13, glucose 133.  Pro BNP 5882, troponin-I is 0.02, total protein 6.9.  ASSESSMENT AND PLAN: 1. Dyspnea/ acute-on-chronic systolic congestive heart failure, likely     multifactorial in the setting of history of pulmonary fibrosis and     chronic  obstructive pulmonary disease with increased oxygen demand     at home.  With his history of fibrosis and chronic obstructive     pulmonary disease, he likely has a little reserve when he     experiences volume overload. 2. Plan to admit and cycle enzymes.  We will diuresis and follow-up     chest x-ray in a.m.  If he shows no clinical improvement with     diuresis, plan pulmonary evaluation.  Follow up his temperature as     he was having a temperature of 100 here in the ED.  He does have a     slightly elevated white count of 12.1.  The patient has no other     localizing signs or symptoms of pneumonia /infection. 3. Congestive heart failure, no recent chest pain.  Cycle enzymes.     Continue home medicines. 4  Pulmonary fibrosis.  See above.  Last time, the patient benefited from steroids.  Likely contact Pulmonary in a.m. 1. Hypertension, stable. Thank you for following this patient.     Nicolasa Ducking, ANP   ______________________________ Marca Ancona, MD    CB/MEDQ  D:  05/28/2011  T:  05/28/2011  Job:  [id number]  Electronically Signed by Nicolasa Ducking ANP on 06/14/2011 03:20:20 PM Electronically Signed by Marca Ancona MD on 06/16/2011 11:39:52 PM

## 2011-06-21 ENCOUNTER — Encounter: Payer: Self-pay | Admitting: Physician Assistant

## 2011-06-21 ENCOUNTER — Ambulatory Visit (INDEPENDENT_AMBULATORY_CARE_PROVIDER_SITE_OTHER): Payer: Medicare Other | Admitting: Physician Assistant

## 2011-06-21 ENCOUNTER — Ambulatory Visit (INDEPENDENT_AMBULATORY_CARE_PROVIDER_SITE_OTHER): Payer: Medicare Other | Admitting: *Deleted

## 2011-06-21 ENCOUNTER — Other Ambulatory Visit: Payer: PRIVATE HEALTH INSURANCE | Admitting: *Deleted

## 2011-06-21 ENCOUNTER — Encounter: Payer: Self-pay | Admitting: Internal Medicine

## 2011-06-21 VITALS — BP 93/63 | HR 90 | Wt 177.0 lb

## 2011-06-21 DIAGNOSIS — I428 Other cardiomyopathies: Secondary | ICD-10-CM

## 2011-06-21 DIAGNOSIS — R0989 Other specified symptoms and signs involving the circulatory and respiratory systems: Secondary | ICD-10-CM

## 2011-06-21 DIAGNOSIS — E785 Hyperlipidemia, unspecified: Secondary | ICD-10-CM

## 2011-06-21 DIAGNOSIS — I714 Abdominal aortic aneurysm, without rupture, unspecified: Secondary | ICD-10-CM

## 2011-06-21 DIAGNOSIS — I5022 Chronic systolic (congestive) heart failure: Secondary | ICD-10-CM

## 2011-06-21 DIAGNOSIS — I25810 Atherosclerosis of coronary artery bypass graft(s) without angina pectoris: Secondary | ICD-10-CM

## 2011-06-21 DIAGNOSIS — J439 Emphysema, unspecified: Secondary | ICD-10-CM

## 2011-06-21 DIAGNOSIS — J4489 Other specified chronic obstructive pulmonary disease: Secondary | ICD-10-CM

## 2011-06-21 DIAGNOSIS — Z95810 Presence of automatic (implantable) cardiac defibrillator: Secondary | ICD-10-CM

## 2011-06-21 DIAGNOSIS — I2510 Atherosclerotic heart disease of native coronary artery without angina pectoris: Secondary | ICD-10-CM

## 2011-06-21 DIAGNOSIS — J449 Chronic obstructive pulmonary disease, unspecified: Secondary | ICD-10-CM

## 2011-06-21 LAB — BASIC METABOLIC PANEL
CO2: 29 mEq/L (ref 19–32)
Calcium: 9.3 mg/dL (ref 8.4–10.5)
Chloride: 103 mEq/L (ref 96–112)
Sodium: 142 mEq/L (ref 135–145)

## 2011-06-21 LAB — ICD DEVICE OBSERVATION
AL IMPEDENCE ICD: 532 Ohm
AL THRESHOLD: 0.5 V
BATTERY VOLTAGE: 3.098 V
CHARGE TIME: 9.359 s
DEV-0020ICD: NEGATIVE
RV LEAD AMPLITUDE: 15.625 mv
RV LEAD THRESHOLD: 1 V
TOT-0002: 0
TOT-0006: 20100503000000
TZAT-0001ATACH: 2
TZAT-0001SLOWVT: 1
TZAT-0001SLOWVT: 2
TZAT-0002ATACH: NEGATIVE
TZAT-0002ATACH: NEGATIVE
TZAT-0002FASTVT: NEGATIVE
TZAT-0004SLOWVT: 8
TZAT-0005SLOWVT: 88 pct
TZAT-0005SLOWVT: 91 pct
TZAT-0012FASTVT: 200 ms
TZAT-0018ATACH: NEGATIVE
TZAT-0018ATACH: NEGATIVE
TZAT-0018FASTVT: NEGATIVE
TZAT-0018SLOWVT: NEGATIVE
TZAT-0018SLOWVT: NEGATIVE
TZAT-0019ATACH: 6 V
TZAT-0019ATACH: 6 V
TZAT-0019FASTVT: 8 V
TZAT-0019SLOWVT: 8 V
TZAT-0020ATACH: 1.5 ms
TZAT-0020FASTVT: 1.5 ms
TZON-0004VSLOWVT: 20
TZON-0005SLOWVT: 12
TZST-0001ATACH: 5
TZST-0001FASTVT: 5
TZST-0001FASTVT: 6
TZST-0001SLOWVT: 3
TZST-0002ATACH: NEGATIVE
TZST-0002FASTVT: NEGATIVE
TZST-0002FASTVT: NEGATIVE
TZST-0002FASTVT: NEGATIVE
TZST-0003SLOWVT: 35 J
VF: 0

## 2011-06-21 NOTE — Assessment & Plan Note (Signed)
Device was interrogated today.

## 2011-06-21 NOTE — Progress Notes (Signed)
icd check in clinic  

## 2011-06-21 NOTE — Assessment & Plan Note (Signed)
Followed by vein and vascular surgery.

## 2011-06-21 NOTE — Assessment & Plan Note (Addendum)
Overall stable.  His weight at home has been completely stable aside from one day.  He did an excellent job of taking an extra dose of Lasix.  Followup labs obtained last week demonstrated potassium of 4.5 and creatinine 1.39.  Continue current therapy.  Followup with [name] in November as scheduled.  Check a basic metabolic panel and BNP today.

## 2011-06-21 NOTE — Assessment & Plan Note (Signed)
Continue current therapy.  Followup with [name].

## 2011-06-21 NOTE — Progress Notes (Signed)
History of Present Illness: Primary Electrophysiologist:  Dr. Sherryl Manges  PCP:  Dr. Lucila [name] Pulmonologist:  Dr. Shan Levans  [name] is a 75 y.o. male who presents for post hospital follow up.   He has a history of CAD, status post CABG with redo bypass in 1997 and 2007, ischemic cardiomyopathy, EF 45-50%, status post ICD implantation, COPD on home O2, hypertension, hyperlipidemia, status post prior stroke, carotid stenosis (Dopplers 11/11: Bilateral 20-39%), AAA.  He is a prior patient of Dr. Deborah Chalk.  He is now followed by [name].  Last cardiac catheterization 4/10: LM occluded, circumflex occluded, old SVG-RCA occluded, old SVG-RCA 90%, left radial to RCA ok, SVG-CFX ok, LIMA-LAD ok, 2 old SVGs stumped.  Last echo 7/12: EF 45-50%, severe LVH, mild LAE.    He had an admission to the hospital in 7/12 secondary to CHF and COPD.  He was diuresed and treated with prednisone.  He improved.  However, he developed progressive dyspnea with exertion 6 weeks prior to admission on 8/27.  He also required higher levels of O2.  He was placed on Lasix IV for diuresis secondary to acute on chronic systolic heart failure.  He was also seen by pulmonology.  It was felt that he had chronic hypoxic respiratory failure in the setting of COPD as well as ischemic heart disease.  He had improvement with diuresis.  Spiriva was added to his medical regimen.  It was not felt that he clearly had interstitial lung disease as previously thought.  He was sent home on home O2.  Discharge weight: 77.4 kg (171 pounds).  Patient says that he's doing much better.  His breathing is stable.  He denies orthopnea, PND or edema.  His oxygen saturation ranges between 93 and 98% with activity and rest.  His weights at home have been completely stable at 167 pounds.  He went up 3 pounds one day and took an extra Lasix.  He denies chest pain or syncope.  We interrogated his device today.  His Optivol numbers are stable.  He  had one nonsustained episode of SVT (6-7 beats).  Past Medical History  Diagnosis Date  . Clotting disorder     2002 -- CABG x4  . Hypertension   . Hyperlipidemia   . Ischemic cardiomyopathy   . Systolic heart failure     EF is 24%  . History of atherosclerotic cardiovascular disease   . ICD (implantable cardiac defibrillator) in place 2010  . Hypertensive heart disease     Current Outpatient Prescriptions  Medication Sig Dispense Refill  . albuterol (PROVENTIL) (2.5 MG/3ML) 0.083% nebulizer solution Take 2.5 mg by nebulization 3 (three) times daily.        Marland Kitchen aspirin 81 MG tablet Take 81 mg by mouth daily.        Marland Kitchen atorvastatin (LIPITOR) 40 MG tablet Take 40 mg by mouth daily.        . benzonatate (TESSALON) 100 MG capsule Take 100 mg by mouth 3 (three) times daily.        . clopidogrel (PLAVIX) 75 MG tablet Take 75 mg by mouth daily.        Marland Kitchen ezetimibe (ZETIA) 10 MG tablet Take 1 tablet (10 mg total) by mouth daily.  30 tablet  6  . FLUoxetine (PROZAC) 20 MG capsule Take 20 mg by mouth daily.        . Fluticasone-Salmeterol (ADVAIR DISKUS) 250-50 MCG/DOSE AEPB Inhale 1 puff into the lungs every 12 (  twelve) hours.        . furosemide (LASIX) 40 MG tablet Take 40 mg by mouth 2 (two) times daily.        . isosorbide dinitrate (ISORDIL) 30 MG tablet Take 30 mg by mouth daily.       Marland Kitchen losartan (COZAAR) 50 MG tablet Take 0.5 tablets (25 mg total) by mouth 2 (two) times daily.  30 tablet  6  . multivitamin (THERAGRAN) per tablet Take 1 tablet by mouth daily.        . nebivolol (BYSTOLIC) 10 MG tablet Take 10 mg by mouth daily.        . NON FORMULARY USE 4 LITERS OF O2 WHEN RESTING; HOWEVER USE 6 LITERS OF O2 ONLY WHEN O2 IS 85% OR LOWER WITH ACTIVITY.       Marland Kitchen pantoprazole (PROTONIX) 40 MG tablet Take 40 mg by mouth daily.        . potassium chloride SA (K-DUR,KLOR-CON) 20 MEQ tablet Take 20 mEq by mouth daily.          Allergies: Allergies  Allergen Reactions  . Sulfonamide  Derivatives     Social history:  Ex-smoker  Vital Signs: BP 93/63  Pulse 90  Wt 177 lb (80.287 kg)  PHYSICAL EXAM: Well nourished, well developed, in no acute distress HEENT: normal Neck: no JVD At 90 degrees Cardiac:  Distant S1, S2; RRR; no murmur Lungs:  Decreased breath sounds bilaterally, dry crackles at the bases that clear with cough, no wheezing Abd: soft, nontender Ext: no edema Skin: warm and dry Neuro:  CNs 2-12 intact, no focal abnormalities noted Psych: Normal affect  EKG:  Atrial paced, heart rate 86, T wave inversions in one, aVL, no significant change compared to prior tracings  ASSESSMENT AND PLAN:

## 2011-06-21 NOTE — Patient Instructions (Signed)
Your physician recommends that you schedule a follow-up appointment in:  08/06/11 @ 3:30 TO SEE DR. KLEIN  You have been referred to 3-4 WEEKS TO SEE DR. PATRICK WRIGHT FOR COPD 496 PER SCOTT WEAVER, PA-C.  Your physician recommends that you return for lab work in: TODAY BMET/BNP 428.22 HEART FAILURE, COPD 496  DECREASE O2 TO 4 LITERS WHEN RESTING; HOWEVER INCREASE O2 TO 6 LITERS ONLY WHEN O2 IS 85% OR LOWER WITH ACTIVITY.

## 2011-06-21 NOTE — Assessment & Plan Note (Signed)
LDL close to optimal 6/12.

## 2011-06-21 NOTE — Assessment & Plan Note (Signed)
Stable.  He denies angina.  He can continue with aspirin and Plavix

## 2011-06-22 ENCOUNTER — Other Ambulatory Visit: Payer: Self-pay | Admitting: Physician Assistant

## 2011-06-22 ENCOUNTER — Telehealth: Payer: Self-pay | Admitting: *Deleted

## 2011-06-22 NOTE — Telephone Encounter (Signed)
Message copied by Tarri Fuller on Fri Jun 22, 2011  3:12 PM ------      Message from: [place], [place] T      Created: Thu Jun 21, 2011  5:35 PM       Creatinine up a little      May be getting too dry      Decrease lasix from 40 mg BID to 40 mg in AM and 20 mg in PM      Repeat BMET in one week      Weigh daily and call if:  Weight up 3 lbs in one day, increased swelling or increased dyspnea.

## 2011-06-22 NOTE — Telephone Encounter (Signed)
pt aware of lab results and states today he is not taking lasix 40 bid but on ly qd, I stated that we were not aware of this. I called Tereso Newcomer, PA-C and changed lasix to 40 qod and 20 qod. Home health nurse will get repeat bmet 06/29/11. Danielle Rankin

## 2011-06-25 NOTE — Discharge Summary (Signed)
[name], [place] NO.:  [id number]  MEDICAL RECORD NO.:  [id number]  LOCATION:  2507                         FACILITY:  [facility]  PHYSICIAN:  Duke Salvia, MD, FACCDATE OF BIRTH:  Jul 16, 1936  DATE OF ADMISSION:  05/28/2011 DATE OF DISCHARGE:  06/03/2011                              DISCHARGE SUMMARY   PRIMARY CARDIOLOGIST:  Duke Salvia, MD, [facility]  DISCHARGE DIAGNOSES: 1. Acute/Chronic systolic heart failure.     a.     Mixed cardiomyopathy.     b.     Ejection fraction 45-50%, severe left ventricular      hypertrophy, by 2-D echo, July 2012.     c.     Status post Medtronic implantable cardioverter      defibrillator, May 2012. 2. End stage chronic obstructive pulmonary disease.     a.     Oxygen dependent.  SECONDARY DIAGNOSES: 1. Coronary artery disease.     a.     Status post remote coronary artery bypass graft; redo      coronary artery bypass graft, in 1997; redo two-vessel coronary      artery bypass graft, in 2007. 2. Hypertension. 3. Hyperlipidemia. 4. Renal insufficiency. 5. History of stroke. 6. Abdominal aortic aneurysm.  REASON FOR ADMISSION:  [name] is a 75 year old male, with complex history as outlined above, who presented to the emergency room with symptoms consistent with acute/chronic systolic heart failure.  HOSPITAL COURSE:  The patient ruled out for myocardial infarction with negative cardiac markers.  He was placed on an aggressive diuretic regimen with Lasix 40 mg IV b.i.d.  The patient was referred for a formal pulmonary evaluation, given his history of intrinsic lung disease.  They noted the clinical improvement with diuretic management, citing that the patient was not on diuretics, when most recently hospitalized, this past July.  They also noted that the patient was on  Advair, and suggested possibly adding Spiriva as an alternative.  They  also concluded that the chronic hypoxemic respiratory failure  was secondary to both COPD/emphysema and ischemic cardiomyopathy.  They felt that it was not clear that the patient has interstitial lung disease, or  that this is an accurate diagnosis.  They cited the most recent CT scan  from July, suggesting bilateral dependent ground glass, superimposed on  COPD/emphysema.  The patient continued to improve clinically, and was cleared for discharge on hospital day #6.  Final thoughts by Dr. Sandrea Hughs, on  morning of discharge, were that the patient was at his baseline, and that he was to get home oxygen supplied by [employer], in the future.  DISCHARGE LABS:  Sodium 140, potassium 4.3, BUN 34, creatinine 1.3, and BNP 930.  OUTSTANDING LABS:  Normal cardiac markers.  BNP 5900 on admission.   Potassium 3.9, BUN 20, and creatinine 1.1 on admission.  INR 1.2 on admission.  WBC 12, hemoglobin 11.9, hematocrit 38 (MCV) 88, and platelet 190 on admission.  TSH 3.8.  Fecal occult blood negative.  Chest x-ray, September 1:  Cardiomegaly; no significant pleural effusions; prominent interstitial densities, probably representing component of interstitial edema.  DISPOSITION:  Stable.  FOLLOWUP: 1. Tereso Newcomer, PA-C in  2 weeks, arrangements to be made through our     office. 2. Dr. Sherryl Manges in approximately 2 months. 3. Metabolic profile next week.  DISCHARGE MEDICATIONS: 1. Lasix 40 mg daily. 2. K-Dur 20 mEq daily. 3. Spiriva 1 capsule daily. 4. Albuterol nebulizer t.i.d. 5. Aspirin 81 daily. 6. Benzonatate 100 mg t.i.d. 7. Bystolic 10 mg daily. 8. Fluoxetine 20 daily. 9. Advair 1 puff b.i.d. 10.Isosorbide dinitrate 30 daily. 11.Lipitor 40 daily. 12.Losartan 50 daily. 13.Plavix 75 daily. 14.Protonix 40 daily. 15.Zetia 10 daily.  DURATION OF DISCHARGE ENCOUNTER:  Greater than 30 minutes, including physician time.     Gene Serpe, PA-C   ______________________________ Duke Salvia, MD, [facility]   GS/MEDQ  D:  06/03/2011  T:   06/04/2011  Job:  [id number]  cc:   Lucila [name], MD Leslye Peer, MD  Electronically Signed by Rozell Searing PA-C on 06/05/2011 02:24:34 PM Electronically Signed by Sherryl Manges MD [facility] on 06/25/2011 04:01:51 PM

## 2011-06-29 ENCOUNTER — Telehealth: Payer: Self-pay | Admitting: *Deleted

## 2011-06-29 ENCOUNTER — Other Ambulatory Visit: Payer: Self-pay | Admitting: Internal Medicine

## 2011-06-29 ENCOUNTER — Other Ambulatory Visit: Payer: Self-pay | Admitting: *Deleted

## 2011-06-29 ENCOUNTER — Encounter: Payer: Self-pay | Admitting: Physician Assistant

## 2011-06-29 DIAGNOSIS — I509 Heart failure, unspecified: Secondary | ICD-10-CM

## 2011-06-29 MED ORDER — FLUOXETINE HCL 40 MG PO CAPS: 40.0000 mg | ORAL_CAPSULE | Freq: Every day | ORAL | Status: DC

## 2011-06-29 MED ORDER — NEBIVOLOL HCL 2.5 MG PO TABS: 2.5000 mg | ORAL_TABLET | Freq: Every day | ORAL | Status: DC

## 2011-06-29 NOTE — Telephone Encounter (Signed)
I spoke with Rhonda with HH. She is aware of Dr. Odessa Fleming recommendations. She will notify the patient and have him call us when he is ready to send in his remote transmission. I will send an updated prescription for bystolic 2.5mg  to [facility] pharmacy in [place]. Bjorn Loser is aware of this.

## 2011-06-29 NOTE — Telephone Encounter (Signed)
I spoke with [name] about what is going on with the patient. He recommends that the patient send in a remote transmission to rule out episodes of VT. He also would like the patient to stop imdur, decrease lasix to 20mg  every other day, decrease bystolic to 2.5mg  once daily, and decrease losartan to 25mg  once daily. He would like HH to call us next week with BP readings on the patient. I will notify Bjorn Loser with HH.

## 2011-06-29 NOTE — Telephone Encounter (Signed)
Late entry- Rhonda called about 45 minutes ago stating that she is seeing the patient today. She states that the patient passed out Sunday when he got out of bed, and then he passed out again yesterday after getting out of the car. He broke his foot when this happened. She states that his BP sitting today is 94-64 HR- 76. His BP standing is 80/50. He has not taken his meds today. He usually takes bysolic 10mg  once daily, Imdur 30mg  once daily, losartan 50mg  once daily, and lasix 40mg  every other day alternating with 20mg  every other day (he took lasix 20mg  yesterday). He does take all of his medications at the same time. He states he has been doing this for years. I explained to Bjorn Loser I will be in touch with [name] and call her back. She is agreeable.

## 2011-07-02 LAB — DIFFERENTIAL
Lymphocytes Relative: 17
Lymphs Abs: 1
Monocytes Relative: 9
Neutro Abs: 4.3
Neutrophils Relative %: 71

## 2011-07-02 LAB — BASIC METABOLIC PANEL
Calcium: 8.9
Chloride: 107
Creatinine, Ser: 1.24
GFR calc Af Amer: >60
GFR calc non Af Amer: 58 — ABNORMAL LOW

## 2011-07-02 LAB — POCT CARDIAC MARKERS
CKMB, poc: 1.1
Troponin i, poc: <0.05

## 2011-07-02 LAB — B-NATRIURETIC PEPTIDE (CONVERTED LAB): Pro B Natriuretic peptide (BNP): 363 — ABNORMAL HIGH

## 2011-07-02 LAB — CBC
MCV: 89.2
RBC: 4.46
WBC: 6.1

## 2011-07-02 NOTE — Telephone Encounter (Signed)
Needs to get through PCP.

## 2011-07-03 ENCOUNTER — Telehealth: Payer: Self-pay | Admitting: *Deleted

## 2011-07-03 NOTE — Telephone Encounter (Signed)
Rhonda with [facility] called regarding [name]. She was out today rechecking his BP readings. Today he is 100/68 on the left arm sitting. He is 90/68 on the right arm sitting, and 78/58 on the right arm standing. He denies dizziness this morning. He has not taken his meds today. His HR's are ranging from 72-80 with his BP readings this morning. He has had no further syncopal spells, but he is in a wheelchair due to his broken foot. I have reviewed these findings with [name]. He recommends that the patient followup with his PCP for hypotension. I have explained this to [place] with [facility]. She states she will make sure he gets an appointment. She was asking me about holding his BP meds. I advised he should hold these if his SBP is < 100. I have also recommended that he f/u with his PCP today or tomorrow. Rhonda verbalizes understanding.

## 2011-07-05 LAB — CARDIAC PANEL(CRET KIN+CKTOT+MB+TROPI)
CK, MB: 1.9 ng/mL (ref 0.3–4.0)
Relative Index: INVALID (ref 0.0–2.5)
Total CK: 66 U/L (ref 7–232)
Total CK: 70 U/L (ref 7–232)
Troponin I: 0.02 ng/mL (ref 0.00–0.06)

## 2011-07-05 LAB — CBC
HCT: 44.4 % (ref 39.0–52.0)
MCHC: 33 g/dL (ref 30.0–36.0)
Platelets: 183 10*3/uL (ref 150–400)
RDW: 16.7 % — ABNORMAL HIGH (ref 11.5–15.5)

## 2011-07-05 LAB — PROTIME-INR
INR: 1.1 (ref 0.00–1.49)
Prothrombin Time: 14 seconds (ref 11.6–15.2)

## 2011-07-05 LAB — HOMOCYSTEINE: Homocysteine: 12.7 umol/L (ref 4.0–15.4)

## 2011-07-05 LAB — GLUCOSE, RANDOM: Glucose, Bld: 117 mg/dL — ABNORMAL HIGH (ref 70–99)

## 2011-07-05 LAB — COMPREHENSIVE METABOLIC PANEL
BUN: 28 mg/dL — ABNORMAL HIGH (ref 6–23)
CO2: 26 mEq/L (ref 19–32)
Calcium: 9.1 mg/dL (ref 8.4–10.5)
GFR calc non Af Amer: 48 mL/min — ABNORMAL LOW (ref >60)
Glucose, Bld: 124 mg/dL — ABNORMAL HIGH (ref 70–99)
Total Protein: 6.9 g/dL (ref 6.0–8.3)

## 2011-07-05 LAB — LIPID PANEL
LDL Cholesterol: 74 mg/dL (ref 0–99)
Total CHOL/HDL Ratio: 3.8 RATIO
Triglycerides: 121 mg/dL (ref <150)
VLDL: 24 mg/dL (ref 0–40)

## 2011-07-05 LAB — TSH: TSH: 2.212 u[IU]/mL (ref 0.350–4.500)

## 2011-07-05 LAB — B-NATRIURETIC PEPTIDE (CONVERTED LAB): Pro B Natriuretic peptide (BNP): 182 pg/mL — ABNORMAL HIGH (ref 0.0–100.0)

## 2011-07-09 ENCOUNTER — Telehealth: Payer: Self-pay | Admitting: Critical Care Medicine

## 2011-07-09 MED ORDER — BUDESONIDE-FORMOTEROL FUMARATE 160-4.5 MCG/ACT IN AERO: 2.0000 | INHALATION_SPRAY | Freq: Two times a day (BID) | RESPIRATORY_TRACT | Status: DC

## 2011-07-09 MED ORDER — TIOTROPIUM BROMIDE MONOHYDRATE 18 MCG IN CAPS: 18.0000 ug | ORAL_CAPSULE | Freq: Every day | RESPIRATORY_TRACT | Status: DC

## 2011-07-09 NOTE — Telephone Encounter (Signed)
Ok for refill on spiriva

## 2011-07-09 NOTE — Telephone Encounter (Signed)
I spoke with pt and he states he needs a refill on spiriva. Pt was put on this when he was d/c'd from the hospital in September. Pt has an upcoming apt Friday at 9:30. Pt states he may not be able to make it though bc he broke his foot and not sure if he can find a ride. I advised pt to call back and cancel if he can't find a ride to get here. Please advise [name] if okay to send rx for spiriva. Thanks  Allergies  Allergen Reactions  . Sulfonamide Derivatives     Carver Fila, CMA

## 2011-07-09 NOTE — Telephone Encounter (Signed)
Pt aware rx sent to pharmacy and nothing further was needed. Pt had to reschedule his apt bc he could not get a ride. Pt is coming in 11/2 at 11:00

## 2011-07-09 NOTE — Telephone Encounter (Signed)
Per PW--ok to send in refill of the spiriva.  This has been sent to the pts pharmacy and pt is aware

## 2011-07-12 ENCOUNTER — Encounter: Payer: Self-pay | Admitting: Internal Medicine

## 2011-07-13 ENCOUNTER — Ambulatory Visit: Payer: Medicare Other | Admitting: Critical Care Medicine

## 2011-08-03 ENCOUNTER — Ambulatory Visit: Payer: Medicare Other | Admitting: Critical Care Medicine

## 2011-08-06 ENCOUNTER — Encounter: Payer: PRIVATE HEALTH INSURANCE | Admitting: Internal Medicine

## 2011-08-24 ENCOUNTER — Other Ambulatory Visit: Payer: Self-pay | Admitting: Internal Medicine

## 2011-08-27 ENCOUNTER — Encounter: Payer: Self-pay | Admitting: Critical Care Medicine

## 2011-08-27 ENCOUNTER — Ambulatory Visit (INDEPENDENT_AMBULATORY_CARE_PROVIDER_SITE_OTHER): Payer: Medicare Other | Admitting: Critical Care Medicine

## 2011-08-27 VITALS — BP 90/68 | HR 77 | Temp 97.4°F | Ht 66.5 in | Wt 175.8 lb

## 2011-08-27 DIAGNOSIS — J438 Other emphysema: Secondary | ICD-10-CM

## 2011-08-27 DIAGNOSIS — J439 Emphysema, unspecified: Secondary | ICD-10-CM

## 2011-08-27 DIAGNOSIS — I509 Heart failure, unspecified: Secondary | ICD-10-CM

## 2011-08-27 MED ORDER — PREDNISONE 10 MG PO TABS: 10.0000 mg | ORAL_TABLET | Freq: Every day | ORAL | Status: AC

## 2011-08-27 NOTE — Progress Notes (Signed)
Subjective:    Patient ID: [name], male    DOB: 1936/08/22, 75 y.o.   MRN: [id number]  HPI  75 y.o.[facility] 7/10 -04/15/11 for CHF, resp failure, Ischemic CM 45-50%   COPD/ Pulm Fibrosis  D/c on oxygen.  CT neg for PE,  pulm infiltrates/fibrosis Since d/c home a few days,  D/c 3L  Was on 4L in hosp.  Now on 4L on oxygen.  Still does not have humidification. Almost went back to the hospital.     Now slowly recovering.  No edema in feet.  No cough.  No real chest pain .  08/28/2011 Since d/c from hosp with copd/chf 8/12 after last seen here first of 8/12. Had lasix resumed with BNP >5000 and CHF on CXR. Had lasix held with 7/12 hosp stay  EF 45-50% on latest echo..  Still dyspneic with any exertion. Blacked out and fx foot 6 weeks ago.  Now shingles on L side.  No real cough.  No real chest pain.  Ok at rest. Pt denies any significant sore throat, nasal congestion or excess secretions, fever, chills, sweats, unintended weight loss, pleurtic or exertional chest pain, orthopnea PND, or leg swelling Pt denies any increase in rescue therapy over baseline, denies waking up needing it or having any early am or nocturnal exacerbations of coughing/wheezing/or dyspnea. Pt also denies any obvious fluctuation in symptoms with  weather or environmental change or other alleviating or aggravating factors    Past Medical History  Diagnosis Date  . CAD (coronary artery disease)     s/p CABG; s/p redo CABG in 1997 and 2007; cath 4/10: LM occluded, CFX occluded, old S-RCA occluded, old S-RCA 90%, left radial to RCA ok, S-CFX ok, L-LAD ok, 2 old SVGs stumped  . Hypertension   . Hyperlipidemia   . Ischemic cardiomyopathy     echo 7/11: EF 45-50%, severe LVH, mild LAE  . Chronic systolic heart failure     EF is 24%; EF 45-50% in 7/11  . Carotid stenosis     doppler 11/11: 20-39% bilat.  . ICD (implantable cardiac defibrillator) in place 2010  . Hypertensive heart disease   . COPD (chronic  obstructive pulmonary disease)   . History of stroke   . Hiatal hernia   . AAA (abdominal aortic aneurysm)     followed by [facility]     Family History  Problem Relation Age of Onset  . Heart attack Brother   . Coronary artery disease Brother   . Coronary artery disease Brother   . Heart disease Brother      History   Social History  . Marital Status: Married    Spouse Name: N/A    Number of Children: N/A  . Years of Education: N/A   Occupational History  . Not on file.   Social History Main Topics  . Smoking status: Former Smoker -- 1.0 packs/day for 47 years    Types: Cigarettes    Quit date: 03/12/2003  . Smokeless tobacco: Never Used  . Alcohol Use: No  . Drug Use: No  . Sexually Active: No   Other Topics Concern  . Not on file   Social History Narrative  . No narrative on file     Allergies  Allergen Reactions  . Sulfonamide Derivatives      Outpatient Prescriptions Prior to Visit  Medication Sig Dispense Refill  . albuterol (PROVENTIL) (2.5 MG/3ML) 0.083% nebulizer solution Take 2.5 mg by nebulization 3 (three) times  daily.        . aspirin 81 MG tablet Take 81 mg by mouth daily.        Marland Kitchen atorvastatin (LIPITOR) 40 MG tablet TAKE ONE TABLET DAILY.  30 tablet  5  . clopidogrel (PLAVIX) 75 MG tablet Take 75 mg by mouth daily.        Marland Kitchen ezetimibe (ZETIA) 10 MG tablet Take 1 tablet (10 mg total) by mouth daily.  30 tablet  6  . FLUoxetine (PROZAC) 40 MG capsule Take 1 capsule (40 mg total) by mouth daily.  30 capsule  4  . Fluticasone-Salmeterol (ADVAIR DISKUS) 250-50 MCG/DOSE AEPB Inhale 1 puff into the lungs every 12 (twelve) hours.        Marland Kitchen losartan (COZAAR) 25 MG tablet Take 1 tablet (25 mg total) by mouth daily.      . multivitamin (THERAGRAN) per tablet Take 1 tablet by mouth daily.        . nebivolol (BYSTOLIC) 2.5 MG tablet Take 1 tablet (2.5 mg total) by mouth daily.  30 tablet  6  . NON FORMULARY USE 4 LITERS OF O2 WHEN RESTING; HOWEVER USE 6 LITERS OF  O2 ONLY WHEN O2 IS 85% OR LOWER WITH ACTIVITY.       Marland Kitchen pantoprazole (PROTONIX) 40 MG tablet Take 40 mg by mouth 2 (two) times daily.       . potassium chloride SA (K-DUR,KLOR-CON) 20 MEQ tablet Take 10 mEq by mouth daily.       Marland Kitchen tiotropium (SPIRIVA HANDIHALER) 18 MCG inhalation capsule Place 1 capsule (18 mcg total) into inhaler and inhale daily.  30 capsule  2  . benzonatate (TESSALON) 100 MG capsule Take 100 mg by mouth 3 (three) times daily.        . furosemide (LASIX) 20 MG tablet every other day. Take one tablet by mouth once daily.      . budesonide-formoterol (SYMBICORT) 160-4.5 MCG/ACT inhaler Inhale 2 puffs into the lungs 2 (two) times daily.  1 Inhaler  5      Review of Systems  Constitutional:   No  weight loss, night sweats,  Fevers, chills, fatigue, lassitude. HEENT:   No headaches,  Difficulty swallowing,  Tooth/dental problems,  Sore throat,                No sneezing, itching, ear ache, nasal congestion, post nasal drip,   CV:  No chest pain,  Orthopnea, PND, swelling in lower extremities, anasarca, dizziness, palpitations  GI  No heartburn, indigestion, abdominal pain, nausea, vomiting, diarrhea, change in bowel habits, loss of appetite  Resp: Notes  shortness of breath with exertion not  at rest.  No excess mucus, no productive cough,  No non-productive cough,  No coughing up of blood.  No change in color of mucus.  No wheezing.  No chest wall deformity  Skin: no rash or lesions.  GU: no dysuria, change in color of urine, no urgency or frequency.  No flank pain.  MS:  No joint pain or swelling.  No decreased range of motion.  No back pain.  Psych:  No change in mood or affect. No depression or anxiety.  No memory loss.     Objective:   Physical Exam  Filed Vitals:   08/27/11 1446  BP: 90/68  Pulse: 77  Temp: 97.4 F (36.3 C)  TempSrc: Oral  Height: 5' 6.5" (1.689 m)  Weight: 175 lb 12.8 oz (79.742 kg)  SpO2: 90%    Gen:  Pleasant, well-nourished, in no  distress,  normal affect  ENT: No lesions,  mouth clear,  oropharynx clear, no postnasal drip  Neck: No JVD, no TMG, no carotid bruits  Lungs: No use of accessory muscles, no dullness to percussion, distant BS  Cardiovascular: RRR, heart sounds normal, no murmur or gallops, no peripheral edema  Abdomen: soft and NT, no HSM,  BS normal  Musculoskeletal: No deformities, no cyanosis or clubbing  Neuro: alert, non focal  Skin: Warm, no lesions or rashes        Assessment & Plan:   COPD with emphysema Copd with recent diastolic CHF flare, now stable , oxygen dependent, steroid dependent Plan Stay on Advair and Spiriva Stay on lasix 40mg  every other day until you see Dr Graciela Husbands STay on oxygen 4Liter rest 6Liter exertion Start Prednisone 10mg  one daily, Rx sent to your pharmacy Return 2 months       Updated Medication List Outpatient Encounter Prescriptions as of 08/27/2011  Medication Sig Dispense Refill  . acyclovir (ZOVIRAX) 800 MG tablet Take 800 mg by mouth. Take 1 tablet by mouth 5 times a day for 7 days       . albuterol (PROVENTIL) (2.5 MG/3ML) 0.083% nebulizer solution Take 2.5 mg by nebulization 3 (three) times daily.        Marland Kitchen aspirin 81 MG tablet Take 81 mg by mouth daily.        Marland Kitchen atorvastatin (LIPITOR) 40 MG tablet TAKE ONE TABLET DAILY.  30 tablet  5  . clopidogrel (PLAVIX) 75 MG tablet Take 75 mg by mouth daily.        Marland Kitchen ezetimibe (ZETIA) 10 MG tablet Take 1 tablet (10 mg total) by mouth daily.  30 tablet  6  . FLUoxetine (PROZAC) 40 MG capsule Take 1 capsule (40 mg total) by mouth daily.  30 capsule  4  . Fluticasone-Salmeterol (ADVAIR DISKUS) 250-50 MCG/DOSE AEPB Inhale 1 puff into the lungs every 12 (twelve) hours.        . furosemide (LASIX) 40 MG tablet Take 40 mg by mouth every other day.        . losartan (COZAAR) 25 MG tablet Take 1 tablet (25 mg total) by mouth daily.      . multivitamin (THERAGRAN) per tablet Take 1 tablet by mouth daily.        .  nebivolol (BYSTOLIC) 2.5 MG tablet Take 1 tablet (2.5 mg total) by mouth daily.  30 tablet  6  . NON FORMULARY USE 4 LITERS OF O2 WHEN RESTING; HOWEVER USE 6 LITERS OF O2 ONLY WHEN O2 IS 85% OR LOWER WITH ACTIVITY.       Marland Kitchen pantoprazole (PROTONIX) 40 MG tablet Take 40 mg by mouth 2 (two) times daily.       . potassium chloride SA (K-DUR,KLOR-CON) 20 MEQ tablet Take 10 mEq by mouth daily.       Marland Kitchen tiotropium (SPIRIVA HANDIHALER) 18 MCG inhalation capsule Place 1 capsule (18 mcg total) into inhaler and inhale daily.  30 capsule  2  . DISCONTD: benzonatate (TESSALON) 100 MG capsule Take 100 mg by mouth 3 (three) times daily.        Marland Kitchen DISCONTD: furosemide (LASIX) 20 MG tablet every other day. Take one tablet by mouth once daily.      . predniSONE (DELTASONE) 10 MG tablet Take 1 tablet (10 mg total) by mouth daily.  40 tablet  6  . DISCONTD: budesonide-formoterol (SYMBICORT) 160-4.5 MCG/ACT inhaler Inhale 2 puffs  into the lungs 2 (two) times daily.  1 Inhaler  5

## 2011-08-27 NOTE — Patient Instructions (Signed)
Stay on Advair and Spiriva Stay on lasix 40mg  every other day until you see Dr Graciela Husbands STay on oxygen 4Liter rest 6Liter exertion Start Prednisone 10mg  one daily, Rx sent to your pharmacy Return 2 months

## 2011-08-28 ENCOUNTER — Telehealth: Payer: Self-pay | Admitting: Internal Medicine

## 2011-08-28 ENCOUNTER — Telehealth: Payer: Self-pay | Admitting: Critical Care Medicine

## 2011-08-28 NOTE — Telephone Encounter (Signed)
I called [facility] and gave verbal order for pt's prednisone rx. Pt is aware.

## 2011-08-28 NOTE — Assessment & Plan Note (Signed)
Copd with recent diastolic CHF flare, now stable , oxygen dependent, steroid dependent Plan Stay on Advair and Spiriva Stay on lasix 40mg  every other day until you see Dr Graciela Husbands STay on oxygen 4Liter rest 6Liter exertion Start Prednisone 10mg  one daily, Rx sent to your pharmacy Return 2 months

## 2011-09-05 ENCOUNTER — Ambulatory Visit (INDEPENDENT_AMBULATORY_CARE_PROVIDER_SITE_OTHER): Payer: Medicare Other | Admitting: Internal Medicine

## 2011-09-05 ENCOUNTER — Encounter: Payer: Self-pay | Admitting: Internal Medicine

## 2011-09-05 DIAGNOSIS — I5032 Chronic diastolic (congestive) heart failure: Secondary | ICD-10-CM

## 2011-09-05 DIAGNOSIS — I25810 Atherosclerosis of coronary artery bypass graft(s) without angina pectoris: Secondary | ICD-10-CM

## 2011-09-05 DIAGNOSIS — Z79899 Other long term (current) drug therapy: Secondary | ICD-10-CM

## 2011-09-05 DIAGNOSIS — I509 Heart failure, unspecified: Secondary | ICD-10-CM

## 2011-09-05 DIAGNOSIS — Z95810 Presence of automatic (implantable) cardiac defibrillator: Secondary | ICD-10-CM

## 2011-09-05 DIAGNOSIS — I2589 Other forms of chronic ischemic heart disease: Secondary | ICD-10-CM

## 2011-09-05 LAB — ICD DEVICE OBSERVATION
ATRIAL PACING ICD: 75 pct
BAMS-0001: 170 {beats}/min
BATTERY VOLTAGE: 3.08 V
HV IMPEDENCE: 44 Ohm
RV LEAD IMPEDENCE ICD: 494 Ohm
RV LEAD THRESHOLD: 0.5 V
TZAT-0001ATACH: 1
TZAT-0001ATACH: 2
TZAT-0001ATACH: 3
TZAT-0001FASTVT: 1
TZAT-0002ATACH: NEGATIVE
TZAT-0002ATACH: NEGATIVE
TZAT-0011SLOWVT: 10 ms
TZAT-0011SLOWVT: 10 ms
TZAT-0012ATACH: 150 ms
TZAT-0012ATACH: 150 ms
TZAT-0012SLOWVT: 200 ms
TZAT-0012SLOWVT: 200 ms
TZAT-0013SLOWVT: 2
TZAT-0013SLOWVT: 2
TZAT-0018ATACH: NEGATIVE
TZAT-0018ATACH: NEGATIVE
TZAT-0018SLOWVT: NEGATIVE
TZAT-0018SLOWVT: NEGATIVE
TZAT-0019ATACH: 6 V
TZAT-0019SLOWVT: 8 V
TZAT-0019SLOWVT: 8 V
TZAT-0020ATACH: 1.5 ms
TZAT-0020FASTVT: 1.5 ms
TZON-0003SLOWVT: 340 ms
TZON-0003VSLOWVT: 400 ms
TZON-0004SLOWVT: 16
TZON-0004VSLOWVT: 20
TZON-0005SLOWVT: 12
TZST-0001ATACH: 6
TZST-0001FASTVT: 2
TZST-0001FASTVT: 3
TZST-0001FASTVT: 4
TZST-0001SLOWVT: 4
TZST-0001SLOWVT: 5
TZST-0001SLOWVT: 6
TZST-0002ATACH: NEGATIVE
TZST-0002ATACH: NEGATIVE
TZST-0002FASTVT: NEGATIVE
TZST-0003SLOWVT: 35 J
TZST-0003SLOWVT: 35 J

## 2011-09-05 MED ORDER — FUROSEMIDE 40 MG PO TABS: ORAL_TABLET | ORAL | Status: DC

## 2011-09-05 NOTE — Assessment & Plan Note (Signed)
He follows his weight very carefully. We'll check his creatinine. We'll anticipate him going on half of dose of Lasix every day. We will have to keep a close track on his renal function

## 2011-09-05 NOTE — Progress Notes (Signed)
HPI  [name] is a 75 y.o. male s seenin followup for ICD implantation in the setting of ischemic heart disease and prior bypass surgery with depressed left ventricular function with ejection fraction of 25%. most recent echo July 2012 demonstrating 45-50% EF   He was hospitalized for heart failure in July 2012 He has been struggling with Dyspnea with a CT scan demonstrating pulmonary fibrosis.However, review of the notes from September suggested that perhaps pulmonary fibrosis was not the underlying problem but rather emphysema with superimposed congestive heart failure. He is continued however to have dyspnea And is now being treated with steroids. He struggles with fatigue.  He has been seeing [name] frequently.  Last catheterization was 2010  He has struggled with renal insufficiencyt with cr 1.6 in sep2012; order to repeat in early oct was never consummmated       Past Medical History  Diagnosis Date  . CAD (coronary artery disease)     s/p CABG; s/p redo CABG in 1997 and 2007; cath 4/10: LM occluded, CFX occluded, old S-RCA occluded, old S-RCA 90%, left radial to RCA ok, S-CFX ok, L-LAD ok, 2 old SVGs stumped  . Hypertension   . Hyperlipidemia   . Ischemic cardiomyopathy     echo 7/11: EF 45-50%, severe LVH, mild LAE  . Chronic systolic heart failure     EF is 24%; EF 45-50% in 7/11  . Carotid stenosis     doppler 11/11: 20-39% bilat.  . ICD (implantable cardiac defibrillator) in place 2010  . Hypertensive heart disease   . COPD (chronic obstructive pulmonary disease)   . History of stroke   . Hiatal hernia   . AAA (abdominal aortic aneurysm)     followed by [facility]    Past Surgical History  Procedure Date  . Coronary artery bypass graft 2007    redo CABG x2 -- at that time had left radial artery graft to the distal right coronary and saphenous vein graft to the distal circumflex.   . Coronary artery bypass graft 1997    redo surgery x2 --  in January 18, 1996,  and at that time had a vein graft to the diagonal and OM and a vein graft to the posterior descending and  posterolateral branches   . Coronary artery bypass graft 1992    x2 -- left internal mammary to the LAD, a single vein graft to the diagonal - obtuse marginal system  and posterior descending  . Insert / replace / remove pacemaker 01/29/2009    Medtronic Virtuoso II DR four chamber AICD  . Cardiac catheterization 01/07/2009     performed by Dr. Deborah Chalk on January 07, 2009 revealing medically manageable coronary artery disease with reduced EF at 30%   . Hernia repair 1998    Current Outpatient Prescriptions  Medication Sig Dispense Refill  . acyclovir (ZOVIRAX) 800 MG tablet Take 800 mg by mouth. Take 1 tablet by mouth 5 times a day for 7 days       . albuterol (PROVENTIL) (2.5 MG/3ML) 0.083% nebulizer solution Take 2.5 mg by nebulization 3 (three) times daily.        Marland Kitchen aspirin 81 MG tablet Take 81 mg by mouth daily.        Marland Kitchen atorvastatin (LIPITOR) 40 MG tablet TAKE ONE TABLET DAILY.  30 tablet  5  . clopidogrel (PLAVIX) 75 MG tablet Take 75 mg by mouth daily.        Marland Kitchen ezetimibe (ZETIA) 10  MG tablet Take 1 tablet (10 mg total) by mouth daily.  30 tablet  6  . FLUoxetine (PROZAC) 40 MG capsule Take 1 capsule (40 mg total) by mouth daily.  30 capsule  4  . Fluticasone-Salmeterol (ADVAIR DISKUS) 250-50 MCG/DOSE AEPB Inhale 1 puff into the lungs every 12 (twelve) hours.        . furosemide (LASIX) 40 MG tablet Take 40 mg by mouth every other day.        . losartan (COZAAR) 25 MG tablet Take 1 tablet (25 mg total) by mouth daily.      . multivitamin (THERAGRAN) per tablet Take 1 tablet by mouth daily.        . nebivolol (BYSTOLIC) 2.5 MG tablet Take 1 tablet (2.5 mg total) by mouth daily.  30 tablet  6  . NON FORMULARY USE 4 LITERS OF O2 WHEN RESTING; HOWEVER USE 6 LITERS OF O2 ONLY WHEN O2 IS 85% OR LOWER WITH ACTIVITY.       Marland Kitchen pantoprazole (PROTONIX) 40 MG tablet Take 40 mg by mouth 2 (two)  times daily.       . potassium chloride SA (K-DUR,KLOR-CON) 20 MEQ tablet Take 10 mEq by mouth daily.       . predniSONE (DELTASONE) 10 MG tablet Take 1 tablet (10 mg total) by mouth daily.  40 tablet  6  . tiotropium (SPIRIVA HANDIHALER) 18 MCG inhalation capsule Place 1 capsule (18 mcg total) into inhaler and inhale daily.  30 capsule  2    Allergies  Allergen Reactions  . Sulfonamide Derivatives     Review of Systems negative except from HPI and PMH  Physical Exam Well developed and well nourished Wearing oxygen HENT normal E scleral and icterus clear Neck Supple JVP flat; carotids brisk and full Diffuse crackles on the left basal crackles on the right2 Regular rate and rhythm, no murmurs gallops or rub Soft with active bowel sounds No clubbing cyanosis Trace Edema Alert and oriented, grossly normal motor and sensory function Skin Warm and Dry   Assessment and  Plan

## 2011-09-05 NOTE — Assessment & Plan Note (Signed)
This is potentially contributing; however, his catheterization was -2 years ago and his enzymes have been negative during hospitalizations.

## 2011-09-05 NOTE — Patient Instructions (Addendum)
Remote monitoring is used to monitor your Pacemaker of ICD from home. This monitoring reduces the number of office visits required to check your device to one time per year. It allows [place] to keep an eye on the functioning of your device to ensure it is working properly. You are scheduled for a device check from home on December 06, 2010. You may send your transmission at any time that day. If you have a wireless device, the transmission will be sent automatically. After your physician reviews your transmission, you will receive a postcard with your next transmission date. Your physician wants you to follow-up in: 12 months with Dr Graciela Husbands.  You will receive a reminder letter in the mail two months in advance. If you don't receive a letter, please call our office to schedule the follow-up appointment.   Your physician recommends that you schedule a follow-up appointment in: 2-3 WEEKS WITH Norma Fredrickson NP  Your physician recommends that you return for lab work in: TODAY BMET  DX V58.69 Your physician has recommended you make the following change in your medication: TAKE FUROSEMIDE 40 MG 1/2 TAB EVERY DAY

## 2011-09-06 LAB — BASIC METABOLIC PANEL
CO2: 32 mEq/L (ref 19–32)
Chloride: 98 mEq/L (ref 96–112)
Creatinine, Ser: 1.2 mg/dL (ref 0.4–1.5)
Potassium: 4.2 mEq/L (ref 3.5–5.1)
Sodium: 140 mEq/L (ref 135–145)

## 2011-09-10 ENCOUNTER — Telehealth: Payer: Self-pay | Admitting: Critical Care Medicine

## 2011-09-10 NOTE — Telephone Encounter (Signed)
Pt is aware with PW recs and will stay on Prednisone 10 mg, one by mouth daily. Pt verbalized understanding of this.

## 2011-09-10 NOTE — Telephone Encounter (Signed)
It doesn't work that way Stay with one daily

## 2011-09-10 NOTE — Telephone Encounter (Signed)
Spoke with pt. He states that since he has been taking prednisone 10 mg daily, his breathing has improved a great deal and so he wants to know if the dose can be increased. He states that he is feeling well now, but feels that with increased dose, he could feel even better. PW, please advise, thanks!

## 2011-10-01 ENCOUNTER — Ambulatory Visit (INDEPENDENT_AMBULATORY_CARE_PROVIDER_SITE_OTHER): Payer: Medicare Other | Admitting: Nurse Practitioner

## 2011-10-01 ENCOUNTER — Telehealth: Payer: Self-pay | Admitting: Physician Assistant

## 2011-10-01 ENCOUNTER — Encounter: Payer: Self-pay | Admitting: Nurse Practitioner

## 2011-10-01 VITALS — BP 128/82 | HR 78 | Ht 66.5 in | Wt 178.0 lb

## 2011-10-01 DIAGNOSIS — J8410 Pulmonary fibrosis, unspecified: Secondary | ICD-10-CM

## 2011-10-01 DIAGNOSIS — F329 Major depressive disorder, single episode, unspecified: Secondary | ICD-10-CM

## 2011-10-01 DIAGNOSIS — I714 Abdominal aortic aneurysm, without rupture, unspecified: Secondary | ICD-10-CM

## 2011-10-01 DIAGNOSIS — I25810 Atherosclerosis of coronary artery bypass graft(s) without angina pectoris: Secondary | ICD-10-CM

## 2011-10-01 DIAGNOSIS — R0600 Dyspnea, unspecified: Secondary | ICD-10-CM

## 2011-10-01 DIAGNOSIS — R0989 Other specified symptoms and signs involving the circulatory and respiratory systems: Secondary | ICD-10-CM

## 2011-10-01 DIAGNOSIS — R0609 Other forms of dyspnea: Secondary | ICD-10-CM

## 2011-10-01 DIAGNOSIS — I2589 Other forms of chronic ischemic heart disease: Secondary | ICD-10-CM

## 2011-10-01 DIAGNOSIS — F341 Dysthymic disorder: Secondary | ICD-10-CM

## 2011-10-01 LAB — BASIC METABOLIC PANEL
BUN: 19 mg/dL (ref 6–23)
CO2: 30 mEq/L (ref 19–32)
Calcium: 10 mg/dL (ref 8.4–10.5)
Chloride: 98 mEq/L (ref 96–112)
Creat: 1.26 mg/dL (ref 0.50–1.35)
Glucose, Bld: 198 mg/dL — ABNORMAL HIGH (ref 70–99)
Potassium: 4.8 mEq/L (ref 3.5–5.3)
Sodium: 141 mEq/L (ref 135–145)

## 2011-10-01 LAB — BRAIN NATRIURETIC PEPTIDE: Brain Natriuretic Peptide: 300.4 pg/mL — ABNORMAL HIGH (ref 0.0–100.0)

## 2011-10-01 NOTE — Assessment & Plan Note (Signed)
For repeat scan for his AAA in January.

## 2011-10-01 NOTE — Patient Instructions (Signed)
We are going to recheck your labs today.  I want to see you in 6 weeks with fasting labs.  Continue to weigh each day and use the extra Lasix as needed.

## 2011-10-01 NOTE — Assessment & Plan Note (Signed)
He remains on chronic oxygen. Continues to desat with activities but says he is holding his own. He will continue with his follow up with [name].

## 2011-10-01 NOTE — Progress Notes (Signed)
[name] Date of Birth: [birth year] Medical Record #[id number]  History of Present Illness: [name] is seen today for a follow up visit. He is seen for [name]. He is a former patient of Dr. Ronnald Nian. He has multiple issues which include an ischemic cardiomyopathy. He has had redo CABG x 3. He has his ICD in place. The last time I saw him, he was terribly hypoxic and he was admitted. Found to not have pulmonary emboli but has pulmonary fibrosis. Has had several readmissions for dyspnea. Has had issues with his medicines. Has had to have his CHF meds reduced due to symptomatic hypotension. Has had a broken ankle. Has had shingles. All since I last saw him 6 months ago. He has most recently been having issues with low blood pressures and has been adjusting his medicines with better results. Continues to use extra Lasix prn weight gain, but has lost over 40 pounds since I last saw him. No ICD discharges. No chest pain. Has his follow up with [facility] next month and will be seeing [name] next month as well. He remains on oxygen continuously. Has had renal insufficiency but last BUN/creatinine was normal. Last BNP was much improved.   Current Outpatient Prescriptions on File Prior to Visit  Medication Sig Dispense Refill  . albuterol (PROVENTIL) (2.5 MG/3ML) 0.083% nebulizer solution Take 2.5 mg by nebulization 3 (three) times daily.        Marland Kitchen aspirin 81 MG tablet Take 81 mg by mouth daily.        Marland Kitchen atorvastatin (LIPITOR) 40 MG tablet TAKE ONE TABLET DAILY.  30 tablet  5  . clopidogrel (PLAVIX) 75 MG tablet Take 75 mg by mouth daily.        Marland Kitchen ezetimibe (ZETIA) 10 MG tablet Take 1 tablet (10 mg total) by mouth daily.  30 tablet  6  . FLUoxetine (PROZAC) 40 MG capsule Take 1 capsule (40 mg total) by mouth daily.  30 capsule  4  . Fluticasone-Salmeterol (ADVAIR DISKUS) 250-50 MCG/DOSE AEPB Inhale 1 puff into the lungs every 12 (twelve) hours.        Marland Kitchen losartan (COZAAR) 25 MG tablet Take 1 tablet (25  mg total) by mouth daily.      . multivitamin (THERAGRAN) per tablet Take 1 tablet by mouth daily.        . nebivolol (BYSTOLIC) 2.5 MG tablet Take 1 tablet (2.5 mg total) by mouth daily.  30 tablet  6  . NON FORMULARY USE 4 LITERS OF O2 WHEN RESTING; HOWEVER USE 6 LITERS OF O2 ONLY WHEN O2 IS 85% OR LOWER WITH ACTIVITY.       Marland Kitchen pantoprazole (PROTONIX) 40 MG tablet Take 40 mg by mouth 2 (two) times daily.       . potassium chloride SA (K-DUR,KLOR-CON) 20 MEQ tablet Take 10 mEq by mouth daily.       Marland Kitchen tiotropium (SPIRIVA HANDIHALER) 18 MCG inhalation capsule Place 1 capsule (18 mcg total) into inhaler and inhale daily.  30 capsule  2  . DISCONTD: furosemide (LASIX) 40 MG tablet 1/2 TAB DAILY        Allergies  Allergen Reactions  . Sulfonamide Derivatives     Past Medical History  Diagnosis Date  . CAD (coronary artery disease)     s/p CABG; s/p redo CABG in 1997 and 2007; cath 4/10: LM occluded, CFX occluded, old S-RCA occluded, old S-RCA 90%, left radial to RCA ok, S-CFX ok, L-LAD ok, 2  old SVGs stumped  . Hypertension   . Hyperlipidemia   . Ischemic cardiomyopathy     echo 7/11: EF 45-50%, severe LVH, mild LAE  . Chronic systolic heart failure     EF is 24%; EF 45-50% in 7/11  . Carotid stenosis     doppler 11/11: 20-39% bilat.  . ICD (implantable cardiac defibrillator) in place 2010  . Hypertensive heart disease   . COPD (chronic obstructive pulmonary disease)   . History of stroke   . Hiatal hernia   . AAA (abdominal aortic aneurysm)     followed by [facility]  . Pulmonary fibrosis     followed by [name]; on chronic oxygen.     Past Surgical History  Procedure Date  . Coronary artery bypass graft 2007    redo CABG x2 -- at that time had left radial artery graft to the distal right coronary and saphenous vein graft to the distal circumflex.   . Coronary artery bypass graft 1997    redo surgery x2 --  in January 18, 1996, and at that time had a vein graft to the diagonal and OM  and a vein graft to the posterior descending and  posterolateral branches   . Coronary artery bypass graft 1992    x2 -- left internal mammary to the LAD, a single vein graft to the diagonal - obtuse marginal system  and posterior descending  . Insert / replace / remove pacemaker 01/29/2009    [employer]onic Virtuoso II DR four chamber AICD  . Cardiac catheterization 01/07/2009     performed by Dr. Deborah Chalk on January 07, 2009 revealing medically manageable coronary artery disease with reduced EF at 30%   . Hernia repair 1998    History  Smoking status  . Former Smoker -- 1.0 packs/day for 47 years  . Types: Cigarettes  . Quit date: 03/12/2003  Smokeless tobacco  . Never Used    History  Alcohol Use No    Family History  Problem Relation Age of Onset  . Heart attack Brother   . Coronary artery disease Brother   . Coronary artery disease Brother   . Heart disease Brother     Review of Systems: The review of systems is positive for dyspnea. He is on chronic oxygen. No chest pain. Remains fatigued.  All other systems were reviewed and are negative.  Physical Exam: BP 128/82  Pulse 78  Ht 5' 6.5" (1.689 m)  Wt 178 lb (80.74 kg)  BMI 28.30 kg/m2 Patient is very pleasant and in no acute distress. He does appear thinner. Skin is warm and dry. Color is normal.  Fingertips remain blue.  HEENT is unremarkable. Normocephalic/atraumatic. PERRL. Sclera are nonicteric. Neck is supple. No masses. No JVD. Lungs are clear with decreased breath sounds. Cardiac exam shows a regular rate and rhythm. Abdomen is soft. Extremities are without edema. Gait and ROM are intact. No gross neurologic deficits noted.   LABORATORY DATA: PENDING   Assessment / Plan:

## 2011-10-01 NOTE — Assessment & Plan Note (Addendum)
He now says he is doing better. Looks compensated for the most part. He is doing a good job with daily weights. We will recheck his BMET and BNP today.  I would like to see him back in 6 weeks with fasting labs. Last echo was in July of 2012 with an EF of 45 to 50% which is improved. He is currently on less CHF meds due to symptomatic hypotension. He will continue with his current dose of medicines. Patient is agreeable to this plan and will call if any problems develop in the interim.

## 2011-10-01 NOTE — Assessment & Plan Note (Signed)
No chest pain. Will continue to monitor.

## 2011-10-01 NOTE — Telephone Encounter (Signed)
Stat labs called in at 8:15pm. Reviewed. Glucose high at 198, do not see hx of DM. May need eval for such. Will forward to Norma Fredrickson, NP for review.  Willian Donson PA-C

## 2011-10-01 NOTE — Assessment & Plan Note (Signed)
He does seem to be doing better from this standpoint.

## 2011-10-03 NOTE — Telephone Encounter (Signed)
Would repeat a fasting glucose with his next visit. Can be with his PCP. He has issues with transportation.

## 2011-10-15 ENCOUNTER — Other Ambulatory Visit: Payer: Self-pay

## 2011-10-15 MED ORDER — CLOPIDOGREL BISULFATE 75 MG PO TABS: 75.0000 mg | ORAL_TABLET | Freq: Every day | ORAL | Status: DC

## 2011-10-15 MED ORDER — FUROSEMIDE 40 MG PO TABS: 20.0000 mg | ORAL_TABLET | Freq: Every day | ORAL | Status: DC

## 2011-10-22 ENCOUNTER — Ambulatory Visit (INDEPENDENT_AMBULATORY_CARE_PROVIDER_SITE_OTHER): Payer: Medicare Other | Admitting: *Deleted

## 2011-10-22 DIAGNOSIS — I714 Abdominal aortic aneurysm, without rupture: Secondary | ICD-10-CM

## 2011-10-29 ENCOUNTER — Other Ambulatory Visit: Payer: Self-pay | Admitting: *Deleted

## 2011-10-29 DIAGNOSIS — I714 Abdominal aortic aneurysm, without rupture: Secondary | ICD-10-CM

## 2011-10-30 ENCOUNTER — Ambulatory Visit (INDEPENDENT_AMBULATORY_CARE_PROVIDER_SITE_OTHER): Payer: Medicare Other | Admitting: Critical Care Medicine

## 2011-10-30 ENCOUNTER — Encounter: Payer: Self-pay | Admitting: Critical Care Medicine

## 2011-10-30 DIAGNOSIS — J8410 Pulmonary fibrosis, unspecified: Secondary | ICD-10-CM

## 2011-10-30 DIAGNOSIS — I509 Heart failure, unspecified: Secondary | ICD-10-CM

## 2011-10-30 DIAGNOSIS — I5032 Chronic diastolic (congestive) heart failure: Secondary | ICD-10-CM

## 2011-10-30 MED ORDER — NEBIVOLOL HCL 2.5 MG PO TABS: 2.5000 mg | ORAL_TABLET | Freq: Every day | ORAL | Status: DC

## 2011-10-30 MED ORDER — FLUTICASONE-SALMETEROL 250-50 MCG/DOSE IN AEPB: 1.0000 | INHALATION_SPRAY | Freq: Two times a day (BID) | RESPIRATORY_TRACT | Status: DC

## 2011-10-30 MED ORDER — FUROSEMIDE 40 MG PO TABS: 40.0000 mg | ORAL_TABLET | Freq: Every day | ORAL | Status: DC

## 2011-10-30 MED ORDER — POTASSIUM CHLORIDE CRYS ER 20 MEQ PO TBCR: 10.0000 meq | EXTENDED_RELEASE_TABLET | Freq: Every day | ORAL | Status: DC

## 2011-10-30 NOTE — Progress Notes (Signed)
Subjective:    Patient ID: [name], male    DOB: 19-Jan-1936, 76 y.o.   MRN: [id number]  HPI  76 y.o.[facility] 7/10 -04/15/11 for CHF, resp failure, Ischemic CM 45-50%   COPD/ Pulm Fibrosis  D/c on oxygen.  CT neg for PE,  pulm infiltrates/fibrosis  1/29 At last ov we rec: Copd with recent diastolic CHF flare, now stable , oxygen dependent, steroid dependent Plan Stay on Advair and Spiriva Stay on lasix 40mg  every other day until you see Dr Graciela Husbands STay on oxygen 4Liter rest 6Liter exertion Start Prednisone 10mg  one daily, Rx sent to your pharmacy Since on pred is better.  Weight will fluctuate and then lasix helps.  Notes more cough, mucus now is clear. No real chest pain Feet will swell with increase in weight    Past Medical History  Diagnosis Date  . CAD (coronary artery disease)     s/p CABG; s/p redo CABG in 1997 and 2007; cath 4/10: LM occluded, CFX occluded, old S-RCA occluded, old S-RCA 90%, left radial to RCA ok, S-CFX ok, L-LAD ok, 2 old SVGs stumped  . Hypertension   . Hyperlipidemia   . Ischemic cardiomyopathy     echo 7/11: EF 45-50%, severe LVH, mild LAE  . Chronic systolic heart failure     EF is 24%; EF 45-50% in 7/11  . Carotid stenosis     doppler 11/11: 20-39% bilat.  . ICD (implantable cardiac defibrillator) in place 2010  . Hypertensive heart disease   . COPD (chronic obstructive pulmonary disease)   . History of stroke   . Hiatal hernia   . AAA (abdominal aortic aneurysm)     followed by [facility]  . Pulmonary fibrosis     followed by [name]; on chronic oxygen.      Family History  Problem Relation Age of Onset  . Heart attack Brother   . Coronary artery disease Brother   . Coronary artery disease Brother   . Heart disease Brother      History   Social History  . Marital Status: Married    Spouse Name: N/A    Number of Children: N/A  . Years of Education: N/A   Occupational History  . Not on file.   Social History Main Topics    . Smoking status: Former Smoker -- 1.0 packs/day for 47 years    Types: Cigarettes    Quit date: 03/12/2003  . Smokeless tobacco: Never Used  . Alcohol Use: No  . Drug Use: No  . Sexually Active: No   Other Topics Concern  . Not on file   Social History Narrative  . No narrative on file     Allergies  Allergen Reactions  . Sulfonamide Derivatives      Outpatient Prescriptions Prior to Visit  Medication Sig Dispense Refill  . albuterol (PROVENTIL) (2.5 MG/3ML) 0.083% nebulizer solution Take 2.5 mg by nebulization. 2- 3 times daily      . aspirin 81 MG tablet Take 81 mg by mouth daily.        Marland Kitchen atorvastatin (LIPITOR) 40 MG tablet TAKE ONE TABLET DAILY.  30 tablet  5  . clopidogrel (PLAVIX) 75 MG tablet Take 1 tablet (75 mg total) by mouth daily.  30 tablet  2  . ezetimibe (ZETIA) 10 MG tablet Take 1 tablet (10 mg total) by mouth daily.  30 tablet  6  . FLUoxetine (PROZAC) 40 MG capsule Take 1 capsule (40 mg total)  by mouth daily.  30 capsule  4  . losartan (COZAAR) 25 MG tablet Take 25 mg by mouth daily. Takes only if SBP is > 125      . multivitamin (THERAGRAN) per tablet Take 1 tablet by mouth daily.        . NON FORMULARY USE 4 LITERS OF O2 WHEN RESTING; HOWEVER USE 6 LITERS OF O2 ONLY WHEN O2 IS 85% OR LOWER WITH ACTIVITY.       Marland Kitchen pantoprazole (PROTONIX) 40 MG tablet Take 40 mg by mouth 2 (two) times daily.       Marland Kitchen tiotropium (SPIRIVA HANDIHALER) 18 MCG inhalation capsule Place 1 capsule (18 mcg total) into inhaler and inhale daily.  30 capsule  2  . Fluticasone-Salmeterol (ADVAIR DISKUS) 250-50 MCG/DOSE AEPB Inhale 1 puff into the lungs daily.       . furosemide (LASIX) 40 MG tablet Take 0.5 tablets (20 mg total) by mouth daily. 1/2 TAB DAILY  30 tablet  2  . nebivolol (BYSTOLIC) 2.5 MG tablet Take 1 tablet (2.5 mg total) by mouth daily.  30 tablet  6  . potassium chloride SA (K-DUR,KLOR-CON) 20 MEQ tablet Take 10 mEq by mouth daily as needed.           Review of  Systems  Constitutional:   No  weight loss, night sweats,  Fevers, chills, fatigue, lassitude. HEENT:   No headaches,  Difficulty swallowing,  Tooth/dental problems,  Sore throat,                No sneezing, itching, ear ache, nasal congestion, post nasal drip,   CV:  No chest pain,  Orthopnea, PND, swelling in lower extremities, anasarca, dizziness, palpitations  GI  No heartburn, indigestion, abdominal pain, nausea, vomiting, diarrhea, change in bowel habits, loss of appetite  Resp: Notes  shortness of breath with exertion not  at rest.  No excess mucus, no productive cough,  No non-productive cough,  No coughing up of blood.  No change in color of mucus.  No wheezing.  No chest wall deformity  Skin: no rash or lesions.  GU: no dysuria, change in color of urine, no urgency or frequency.  No flank pain.  MS:  No joint pain or swelling.  No decreased range of motion.  No back pain.  Psych:  No change in mood or affect. No depression or anxiety.  No memory loss.     Objective:   Physical Exam  Filed Vitals:   10/30/11 1534 10/30/11 1535  BP: 122/88   Pulse: 88 77  Temp: 97.5 F (36.4 C)   TempSrc: Oral   Height: 5' 6.5" (1.689 m)   Weight: 181 lb 9.6 oz (82.373 kg)   SpO2: 57% 95%    Gen: Pleasant, well-nourished, in no distress,  normal affect  ENT: No lesions,  mouth clear,  oropharynx clear, no postnasal drip  Neck: No JVD, no TMG, no carotid bruits  Lungs: No use of accessory muscles, no dullness to percussion, distant . Bibasilar rales  Cardiovascular: RRR, heart sounds normal, no murmur or gallops, 2++ peripheral edema  Abdomen: soft and NT, no HSM,  BS normal  Musculoskeletal: No deformities, no cyanosis or clubbing  Neuro: alert, non focal  Skin: Warm, no lesions or rashes        Assessment & Plan:   Chronic diastolic and systolic heart failure Pt with variable weight gain 5# in 24hrs, inconsistent beta blocker use, inconsistent diuretic use. Acute  systolic/diastolic  CHF Plan Use bystolic daily 2.5mg /d USe lasix 40mg  /d USe KCL daily Call to [facility] , spoke with Norma Fredrickson NP who will f/u pt and consider referral to CHF clinic Refer to [facility] Care Management program  Pulmonary fibrosis, postinflammatory PUlmonary fibrosis , idiopathic Plan Increase oxygen 4L rest 6 L exertion Cont prednisone  For obstructive component use advair one puff bid     Updated Medication List Outpatient Encounter Prescriptions as of 10/30/2011  Medication Sig Dispense Refill  . albuterol (PROVENTIL) (2.5 MG/3ML) 0.083% nebulizer solution Take 2.5 mg by nebulization. 2- 3 times daily      . aspirin 81 MG tablet Take 81 mg by mouth daily.        Marland Kitchen atorvastatin (LIPITOR) 40 MG tablet TAKE ONE TABLET DAILY.  30 tablet  5  . clopidogrel (PLAVIX) 75 MG tablet Take 1 tablet (75 mg total) by mouth daily.  30 tablet  2  . ezetimibe (ZETIA) 10 MG tablet Take 1 tablet (10 mg total) by mouth daily.  30 tablet  6  . FLUoxetine (PROZAC) 40 MG capsule Take 1 capsule (40 mg total) by mouth daily.  30 capsule  4  . Fluticasone-Salmeterol (ADVAIR DISKUS) 250-50 MCG/DOSE AEPB Inhale 1 puff into the lungs 2 (two) times daily.  60 each  6  . furosemide (LASIX) 40 MG tablet Take 1 tablet (40 mg total) by mouth daily. 1/2 TAB DAILY as directed  30 tablet  6  . losartan (COZAAR) 25 MG tablet Take 25 mg by mouth daily. Takes only if SBP is > 125      . multivitamin (THERAGRAN) per tablet Take 1 tablet by mouth daily.        . nebivolol (BYSTOLIC) 2.5 MG tablet Take 1 tablet (2.5 mg total) by mouth daily.      . NON FORMULARY USE 4 LITERS OF O2 WHEN RESTING; HOWEVER USE 6 LITERS OF O2 ONLY WHEN O2 IS 85% OR LOWER WITH ACTIVITY.       Marland Kitchen pantoprazole (PROTONIX) 40 MG tablet Take 40 mg by mouth 2 (two) times daily.       . potassium chloride SA (K-DUR,KLOR-CON) 20 MEQ tablet Take 0.5 tablets (10 mEq total) by mouth daily.      . predniSONE (DELTASONE) 10 MG tablet Take 1  tablet by mouth Daily.      Marland Kitchen tiotropium (SPIRIVA HANDIHALER) 18 MCG inhalation capsule Place 1 capsule (18 mcg total) into inhaler and inhale daily.  30 capsule  2  . DISCONTD: Fluticasone-Salmeterol (ADVAIR DISKUS) 250-50 MCG/DOSE AEPB Inhale 1 puff into the lungs daily.       Marland Kitchen DISCONTD: furosemide (LASIX) 40 MG tablet Take 0.5 tablets (20 mg total) by mouth daily. 1/2 TAB DAILY  30 tablet  2  . DISCONTD: furosemide (LASIX) 40 MG tablet Take 20 mg by mouth daily. 1/2 TAB DAILY as directed      . DISCONTD: nebivolol (BYSTOLIC) 2.5 MG tablet Take 1 tablet (2.5 mg total) by mouth daily.  30 tablet  6  . DISCONTD: nebivolol (BYSTOLIC) 2.5 MG tablet Take 2.5 mg by mouth daily. Pt only takes this is SBP is > 125      . DISCONTD: potassium chloride SA (K-DUR,KLOR-CON) 20 MEQ tablet Take 10 mEq by mouth daily as needed.

## 2011-10-30 NOTE — Patient Instructions (Signed)
Increase lasix to 40mg  daily Increase potassium to one daily Take bystolic 2.5 mg daily regardless of blood pressure reading Increase Advair to one puff twice daily We will have a home nurse from [facility] call you We will have Lawson Fiscal from Cardiology call you for a follow up appt next week Stay on oxygen 6Liter with exertion, 4liter at rest Return 2 months

## 2011-10-31 ENCOUNTER — Encounter: Payer: Self-pay | Admitting: Vascular Surgery

## 2011-10-31 NOTE — Procedures (Unsigned)
DUPLEX ULTRASOUND OF ABDOMINAL AORTA  INDICATION:  AAA.  HISTORY: Diabetes:  No. Cardiac:  Pacemaker. Hypertension:  Yes. Smoking:  Quit. Connective Tissue Disorder: Family History:  No. Previous Surgery:  No.  DUPLEX EXAM:         AP (cm)                   TRANSVERSE (cm) Proximal             3.99 cm                   Not visualized Mid                  4.68 cm                   5.01 cm Distal               3.50 cm                   3.50 cm Right Iliac          1.41 cm                   1.51 cm Left Iliac           1.31 cm                   1.45 cm  PREVIOUS:  Date: 04/21/2010  AP:  4.49  TRANSVERSE:  4.78  IMPRESSION:  Abdominal aortic aneurysm noted with slight increase in the maximum diameter measuring 4.68 x 5.01 cm.  ___________________________________________ Larina Earthly, M.D.  EM/MEDQ  D:  10/23/2011  T:  10/23/2011  Job:  [id number]

## 2011-10-31 NOTE — Assessment & Plan Note (Signed)
Pt with variable weight gain 5# in 24hrs, inconsistent beta blocker use, inconsistent diuretic use. Acute systolic/diastolic CHF Plan Use bystolic daily 2.5mg /d USe lasix 40mg  /d USe KCL daily Call to [facility] , spoke with Norma Fredrickson NP who will f/u pt and consider referral to CHF clinic Refer to [facility] Care Management program

## 2011-10-31 NOTE — Assessment & Plan Note (Addendum)
PUlmonary fibrosis , idiopathic Plan Increase oxygen 4L rest 6 L exertion Cont prednisone  For obstructive component use advair one puff bid

## 2011-11-02 ENCOUNTER — Ambulatory Visit (INDEPENDENT_AMBULATORY_CARE_PROVIDER_SITE_OTHER): Payer: Medicare Other | Admitting: Nurse Practitioner

## 2011-11-02 ENCOUNTER — Encounter: Payer: Self-pay | Admitting: Nurse Practitioner

## 2011-11-02 ENCOUNTER — Other Ambulatory Visit: Payer: Self-pay | Admitting: *Deleted

## 2011-11-02 VITALS — BP 108/82 | HR 76 | Ht 66.5 in | Wt 178.0 lb

## 2011-11-02 DIAGNOSIS — I25810 Atherosclerosis of coronary artery bypass graft(s) without angina pectoris: Secondary | ICD-10-CM

## 2011-11-02 DIAGNOSIS — R0989 Other specified symptoms and signs involving the circulatory and respiratory systems: Secondary | ICD-10-CM

## 2011-11-02 DIAGNOSIS — R0609 Other forms of dyspnea: Secondary | ICD-10-CM

## 2011-11-02 DIAGNOSIS — I714 Abdominal aortic aneurysm, without rupture: Secondary | ICD-10-CM

## 2011-11-02 DIAGNOSIS — R0600 Dyspnea, unspecified: Secondary | ICD-10-CM

## 2011-11-02 DIAGNOSIS — I509 Heart failure, unspecified: Secondary | ICD-10-CM

## 2011-11-02 DIAGNOSIS — I5032 Chronic diastolic (congestive) heart failure: Secondary | ICD-10-CM

## 2011-11-02 LAB — BASIC METABOLIC PANEL
BUN: 26 mg/dL — ABNORMAL HIGH (ref 6–23)
CO2: 32 mEq/L (ref 19–32)
Calcium: 9.5 mg/dL (ref 8.4–10.5)
Chloride: 97 mEq/L (ref 96–112)
Creatinine, Ser: 1.3 mg/dL (ref 0.4–1.5)
GFR: 56.38 mL/min — ABNORMAL LOW (ref >60.00)
Glucose, Bld: 126 mg/dL — ABNORMAL HIGH (ref 70–99)
Potassium: 4.7 mEq/L (ref 3.5–5.1)
Sodium: 140 mEq/L (ref 135–145)

## 2011-11-02 LAB — BRAIN NATRIURETIC PEPTIDE: Pro B Natriuretic peptide (BNP): 141 pg/mL — ABNORMAL HIGH (ref 0.0–100.0)

## 2011-11-02 MED ORDER — LOSARTAN POTASSIUM 25 MG PO TABS: 25.0000 mg | ORAL_TABLET | Freq: Every day | ORAL | Status: DC

## 2011-11-02 NOTE — Assessment & Plan Note (Signed)
He had his duplex last month showing slight increase at 4.6 x 5.0. He continues to follow up with [facility].

## 2011-11-02 NOTE — Assessment & Plan Note (Signed)
No chest pain at the present time.

## 2011-11-02 NOTE — Assessment & Plan Note (Signed)
We are rechecking his labs today. I am also going to get a limited echo to relook at his EF. My suspicion is that his shortness of breath is more related to the fibrosis. Raheel is willing to take the Bystolic and Losartan daily. Bystolic is not indicated for CHF and I am not sure he would tolerate transitioning to Coreg. He does do a good job of checking his blood pressure. He understands that we may see lower readings and this will be ok as long as he is not symptomatic. We will see what the echo shows. I think we will be limited with his medicines due to hypotension and certainly do not want another injury as the result. We will discuss possible CHF referral at his next visit but I am not sure that there will be too many options for him there. For now, he is holding his own and feels pretty good. We will see him back in 2 weeks. I will ask [name] to review and give input as well. Patient is agreeable to this plan and will call if any problems develop in the interim.

## 2011-11-02 NOTE — Progress Notes (Signed)
[name] Date of Birth: 1936-06-30 Medical Record #[id number]  History of Present Illness: [name] comes back today for a follow up visit. He is seen for [name] and also at the request of [name]. It is an approximate 6 week check. He has multiple issues which include an ischemic CM with CABG redo on 3 separate occasions. He has an ICD in place. He has pulmonary fibrosis and is on chronic oxygen therapy. He has had to have his CHF meds reduced due to symptomatic hypotension which did result in an ankle fracture. He was using Lasix prn and using his Bystolic and Losartan if his blood pressure was greater than 125 systolic.   He saw [name] earlier this week. Lavarr says he made a mistake and used a new oxygen tank for that visit and forgot to turn up his flow. His sats were down in the 50's but he recovered with higher oxygen flow. [name] has placed him on his bystolic daily and had him take his Lasix every day. He did have swelling on exam and appeared to be volume overloaded.  He also remains on chronic prednisone use. The question of referral to the CHF clinic has been brought up.   Montell comes in today. Says he is doing ok. He "feels good all the time". He does desat with activity and even with talking but notes "I'm not talking at home, because no one is there". He is not having chest pain. Appetite is ok. No swelling. Blood pressure diary was reviewed. He had one reading in the high 90 systolic but was asymptomatic. His echo from July showed an EF of 45 to 50%.   Current Outpatient Prescriptions on File Prior to Visit  Medication Sig Dispense Refill  . albuterol (PROVENTIL) (2.5 MG/3ML) 0.083% nebulizer solution Take 2.5 mg by nebulization. 2- 3 times daily      . aspirin 81 MG tablet Take 81 mg by mouth daily.        Marland Kitchen atorvastatin (LIPITOR) 40 MG tablet TAKE ONE TABLET DAILY.  30 tablet  5  . clopidogrel (PLAVIX) 75 MG tablet Take 1 tablet (75 mg total) by mouth daily.   30 tablet  2  . ezetimibe (ZETIA) 10 MG tablet Take 1 tablet (10 mg total) by mouth daily.  30 tablet  6  . FLUoxetine (PROZAC) 40 MG capsule Take 1 capsule (40 mg total) by mouth daily.  30 capsule  4  . Fluticasone-Salmeterol (ADVAIR DISKUS) 250-50 MCG/DOSE AEPB Inhale 1 puff into the lungs 2 (two) times daily.  60 each  6  . multivitamin (THERAGRAN) per tablet Take 1 tablet by mouth daily.        . nebivolol (BYSTOLIC) 2.5 MG tablet Take 1 tablet (2.5 mg total) by mouth daily.      . NON FORMULARY USE 4 LITERS OF O2 WHEN RESTING; HOWEVER USE 6 LITERS OF O2 ONLY WHEN O2 IS 85% OR LOWER WITH ACTIVITY.       Marland Kitchen pantoprazole (PROTONIX) 40 MG tablet Take 40 mg by mouth 2 (two) times daily.       . predniSONE (DELTASONE) 10 MG tablet Take 1 tablet by mouth Daily.      Marland Kitchen tiotropium (SPIRIVA HANDIHALER) 18 MCG inhalation capsule Place 1 capsule (18 mcg total) into inhaler and inhale daily.  30 capsule  2  . DISCONTD: furosemide (LASIX) 40 MG tablet Take 1 tablet (40 mg total) by mouth daily. 1/2 TAB DAILY as  directed  30 tablet  6  . DISCONTD: losartan (COZAAR) 25 MG tablet Take 25 mg by mouth daily. Takes only if SBP is > 125      . DISCONTD: potassium chloride SA (K-DUR,KLOR-CON) 20 MEQ tablet Take 0.5 tablets (10 mEq total) by mouth daily.        Allergies  Allergen Reactions  . Sulfonamide Derivatives     Past Medical History  Diagnosis Date  . CAD (coronary artery disease)     s/p CABG; s/p redo CABG in 1997 and 2007; cath 4/10: LM occluded, CFX occluded, old S-RCA occluded, old S-RCA 90%, left radial to RCA ok, S-CFX ok, L-LAD ok, 2 old SVGs stumped  . Hypertension   . Hyperlipidemia   . Ischemic cardiomyopathy     echo 7/12: EF 45-50%, severe LVH, mild LAE  . Chronic systolic heart failure     EF is 24% in the past; EF 45-50% in 7/12  . Carotid stenosis     doppler 11/11: 20-39% bilat.  . ICD (implantable cardiac defibrillator) in place 2010  . Hypertensive heart disease   . COPD  (chronic obstructive pulmonary disease)   . History of stroke   . Hiatal hernia   . AAA (abdominal aortic aneurysm)     followed by [facility]  . Pulmonary fibrosis     followed by [name]; on chronic oxygen.     Past Surgical History  Procedure Date  . Coronary artery bypass graft 2007    redo CABG x2 -- at that time had left radial artery graft to the distal right coronary and saphenous vein graft to the distal circumflex.   . Coronary artery bypass graft 1997    redo surgery x2 --  in January 18, 1996, and at that time had a vein graft to the diagonal and OM and a vein graft to the posterior descending and  posterolateral branches   . Coronary artery bypass graft 1992    x2 -- left internal mammary to the LAD, a single vein graft to the diagonal - obtuse marginal system  and posterior descending  . Insert / replace / remove pacemaker 01/29/2009    Medtronic Virtuoso II DR four chamber AICD  . Cardiac catheterization 01/07/2009     performed by Dr. Deborah Chalk on January 07, 2009 revealing medically manageable coronary artery disease with reduced EF at 30%   . Hernia repair 1998    History  Smoking status  . Former Smoker -- 1.0 packs/day for 47 years  . Types: Cigarettes  . Quit date: 03/12/2003  Smokeless tobacco  . Never Used    History  Alcohol Use No    Family History  Problem Relation Age of Onset  . Heart attack Brother   . Coronary artery disease Brother   . Coronary artery disease Brother   . Heart disease Brother     Review of Systems: The review of systems is per the HPI.  All other systems were reviewed and are negative.  Physical Exam: BP 108/82  Pulse 76  Ht 5' 6.5" (1.689 m)  Wt 178 lb (80.74 kg)  BMI 28.30 kg/m2 Patient is very pleasant and in no acute distress. He has his oxygen in place. Skin is warm and dry. Color is normal.  HEENT is unremarkable. Normocephalic/atraumatic. PERRL. Sclera are nonicteric. Neck is supple. No masses. No JVD. Lungs are clear.  Cardiac exam shows a regular rate and rhythm. No S3 noted. Abdomen is soft. Extremities are without  any edema today. Weight is unchanged from our last visit. Gait and ROM are intact. No gross neurologic deficits noted.  LABORATORY DATA: Pending   Assessment / Plan:

## 2011-11-02 NOTE — Patient Instructions (Signed)
We are going to get another ultrasound of your heart looking at your pumping function.  We are going to check your labs today  I want you to take the Losartan 25 mg each morning.  Take your Bystolic 2.5 mg each night.  You are going to see your blood pressure get lower. This is ok as long as you feel ok.   I will see you in 2 weeks. We will then decide about referral to the Heart Failure Clinic.  Continue your other medicines, weighing daily and using your oxygen.   Call the [facility] office at [phone] if you have any questions, problems or concerns.

## 2011-11-09 ENCOUNTER — Ambulatory Visit (HOSPITAL_COMMUNITY): Payer: Medicare Other | Attending: Cardiology | Admitting: Radiology

## 2011-11-09 DIAGNOSIS — I714 Abdominal aortic aneurysm, without rupture, unspecified: Secondary | ICD-10-CM | POA: Insufficient documentation

## 2011-11-09 DIAGNOSIS — J4489 Other specified chronic obstructive pulmonary disease: Secondary | ICD-10-CM | POA: Insufficient documentation

## 2011-11-09 DIAGNOSIS — I2589 Other forms of chronic ischemic heart disease: Secondary | ICD-10-CM | POA: Insufficient documentation

## 2011-11-09 DIAGNOSIS — J449 Chronic obstructive pulmonary disease, unspecified: Secondary | ICD-10-CM | POA: Insufficient documentation

## 2011-11-09 DIAGNOSIS — E785 Hyperlipidemia, unspecified: Secondary | ICD-10-CM | POA: Insufficient documentation

## 2011-11-09 DIAGNOSIS — I509 Heart failure, unspecified: Secondary | ICD-10-CM

## 2011-11-09 DIAGNOSIS — R0600 Dyspnea, unspecified: Secondary | ICD-10-CM

## 2011-11-09 DIAGNOSIS — J8410 Pulmonary fibrosis, unspecified: Secondary | ICD-10-CM | POA: Insufficient documentation

## 2011-11-09 DIAGNOSIS — R0989 Other specified symptoms and signs involving the circulatory and respiratory systems: Secondary | ICD-10-CM | POA: Insufficient documentation

## 2011-11-09 DIAGNOSIS — R0609 Other forms of dyspnea: Secondary | ICD-10-CM | POA: Insufficient documentation

## 2011-11-09 DIAGNOSIS — Z9981 Dependence on supplemental oxygen: Secondary | ICD-10-CM | POA: Insufficient documentation

## 2011-11-13 ENCOUNTER — Other Ambulatory Visit: Payer: Medicare Other | Admitting: *Deleted

## 2011-11-13 ENCOUNTER — Telehealth: Payer: Self-pay | Admitting: Internal Medicine

## 2011-11-13 ENCOUNTER — Ambulatory Visit: Payer: Medicare Other | Admitting: Nurse Practitioner

## 2011-11-13 NOTE — Telephone Encounter (Signed)
I left a message for [name] to call.  

## 2011-11-13 NOTE — Telephone Encounter (Signed)
I spoke with Marchelle Folks. She states that she is very concerned about the patient and the fact that she thinks he is depressed. He lost his wife a year a half ago. He does not have much family around and it is hard for him to get out due to his oxygen and how SOB he gets. She states that he is on Prozac, but that she thinks this needs to be adjusted. The patient has not seen his PCP in about 10 years. Per Marchelle Folks, she thinks that Dr. Deborah Chalk and [name] may have been filling this for the patient. I explained to Marchelle Folks that the patient will need to follow up with his PCP as [name] is now his primary cardiologist and he will not prescribe Prozac. Marchelle Folks thinks that if we encourage him to follow up with his PCP he will take the recommendation from our office better than from her. She also thinks he needs a handicapped placard. I explained that we could probably help complete a handicapped placard for him. I explained I will forward this message to Lawson Fiscal so she will be aware of Amanda's concerns prior to seeing him on 2/22. Per Darnelle Bos can call her if she needs to.

## 2011-11-13 NOTE — Telephone Encounter (Signed)
New msg: Nurse Case Manager calling wanting to speak with nurse about some concerns she had after last home visit. Marchelle Folks would like to speak with nurse about how pt is doing. Please return call to discuss further.

## 2011-11-13 NOTE — Telephone Encounter (Signed)
Heather, I can talk with him when he comes back. I think we did increase his Prozac right after his wife died. Dr. Deborah Chalk prescribed this and I do not mind refilling. Handicap sticker is fine.

## 2011-11-23 ENCOUNTER — Ambulatory Visit: Payer: Medicare Other | Admitting: Nurse Practitioner

## 2011-11-24 IMAGING — CR DG CHEST 1V PORT
1 series · 1 of 1 positions shown · non-contrast
Comparison: 02/01/2009

CLINICAL DATA: Shortness of breath.  Cough.

PORTABLE CHEST - 1 VIEW

[AP]
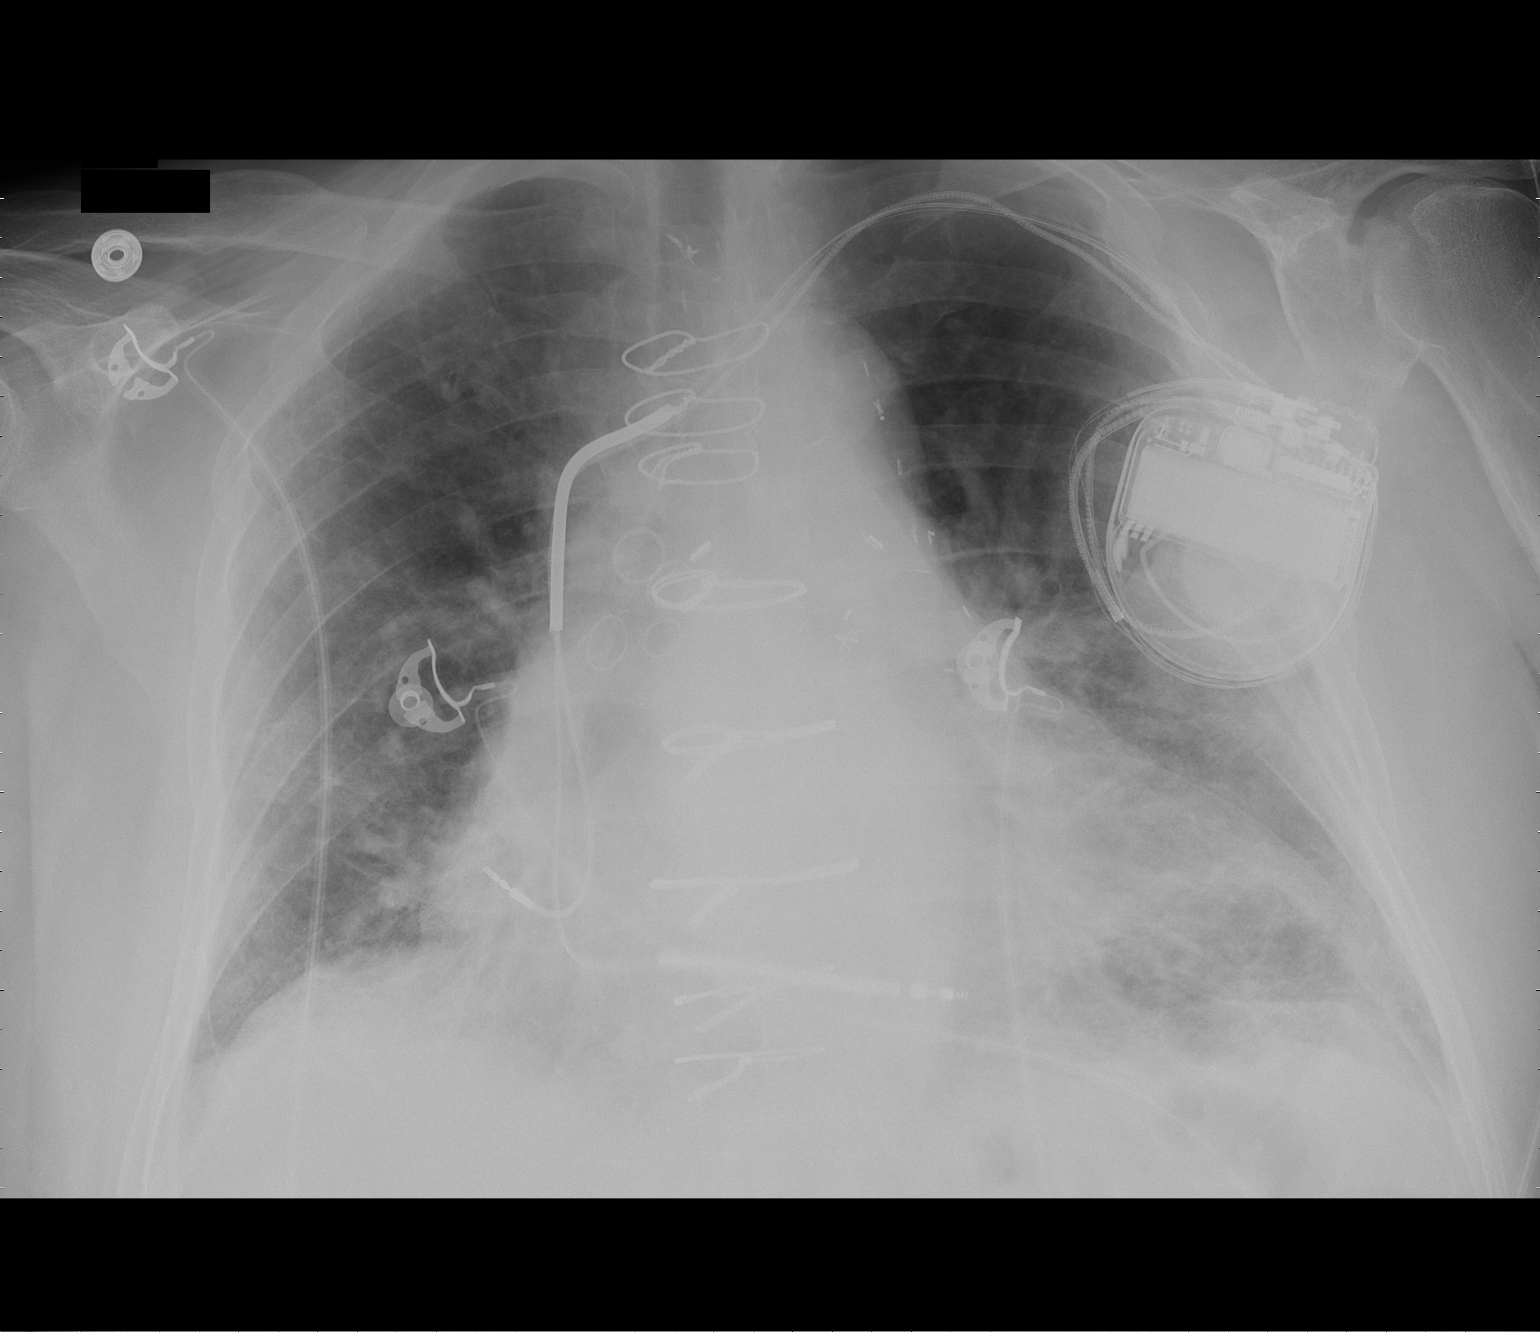

[1 of 1 positions shown; findings below may reference images not displayed]

FINDINGS: There is chronic cardiomegaly with a transvenous
defibrillator and evidence of prior CABG.  The interstitial
markings are appreciably increased at both lung bases since the
prior study without discrete consolidation.  No discrete effusions.
Vascularity is slightly prominent.  No acute osseous abnormality.
Old right clavicle fracture.
IMPRESSION: New pulmonary vascular congestion.  Chronic cardiomegaly.

Increased interstitial accentuation of the lung bases which could
represent progressive chronic interstitial disease or more acute
interstitial pneumonitis.

## 2011-11-24 IMAGING — CT CT ANGIO CHEST
2 of 7 series · 18 of 36 positions shown · IV contrast (agent unspecified)
Comparison: Chest x-ray dated 04/10/2011

CLINICAL DATA: Shortness of breath.  Abnormal chest x-ray dated
04/10/2011

CT ANGIOGRAPHY CHEST WITH CONTRAST
TECHNIQUE: Multidetector CT imaging of the chest was performed
using the standard protocol during bolus administration of
intravenous contrast.  Multiplanar CT image reconstructions
including MIPs were obtained to evaluate the vascular anatomy.
Contrast:  60 ml [id number]

[Series 5: pe thins · axial · 0.71mm/px · z∈[-258,-20]mm · 17 of 267 slices shown]
[im 15/267  lung]
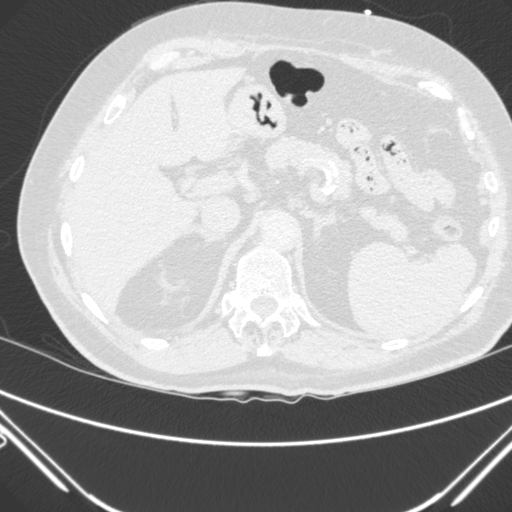
[im 30/267  mediastinal]
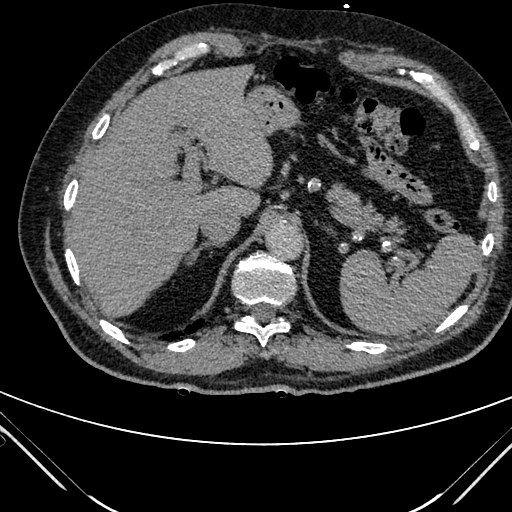
[im 45/267  lung]
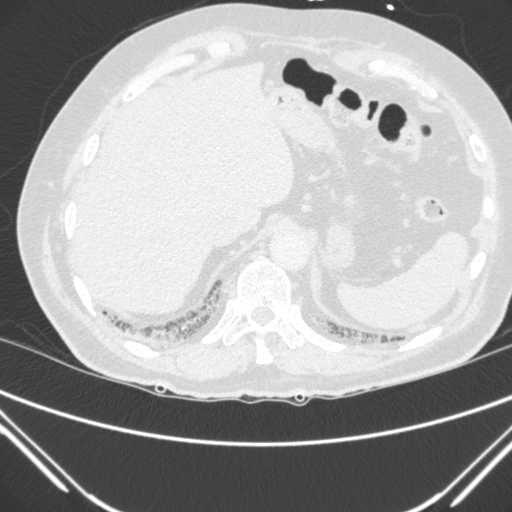
[im 60/267  mediastinal]
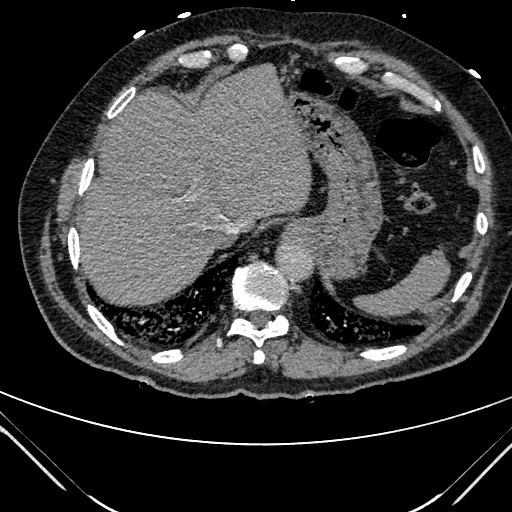
[im 74/267  lung]
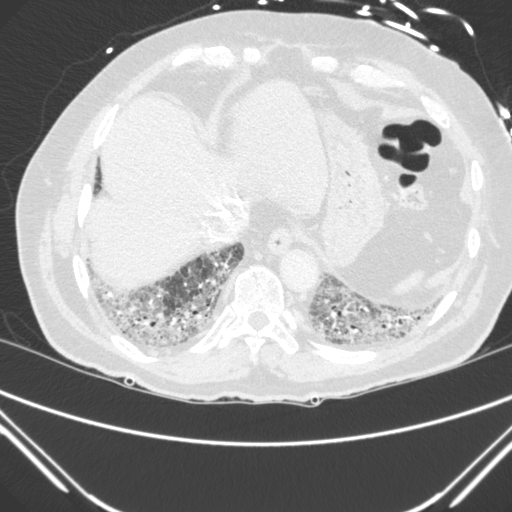
[im 89/267  mediastinal]
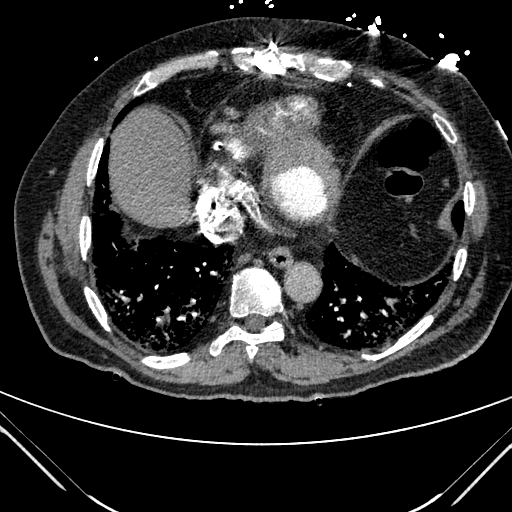
[im 104/267  lung]
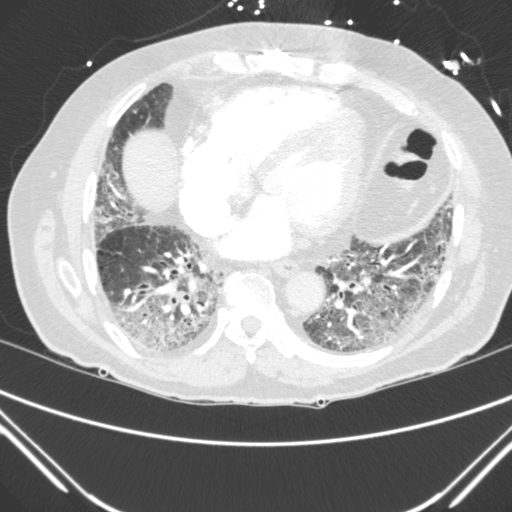
[im 119/267  mediastinal]
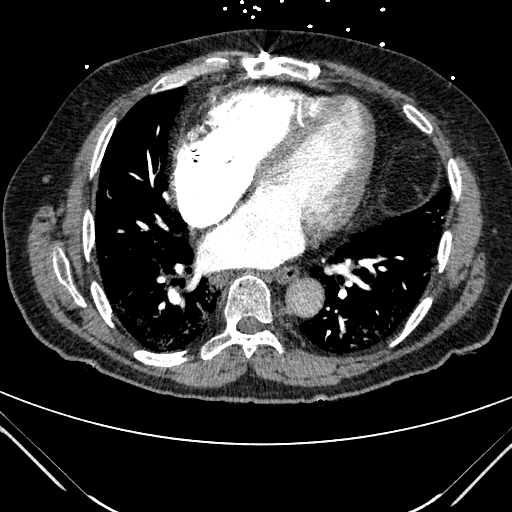
[im 134/267  lung]
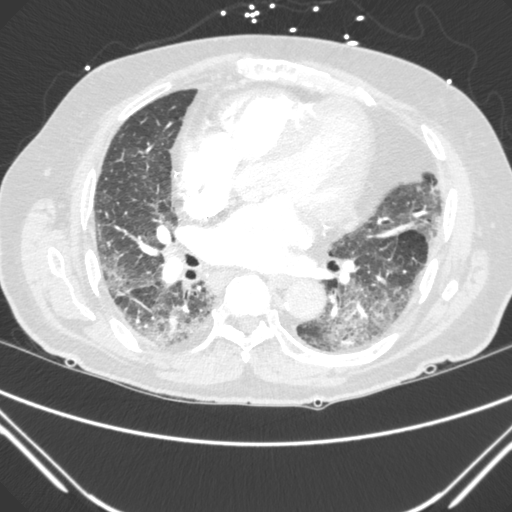
[im 148/267  mediastinal]
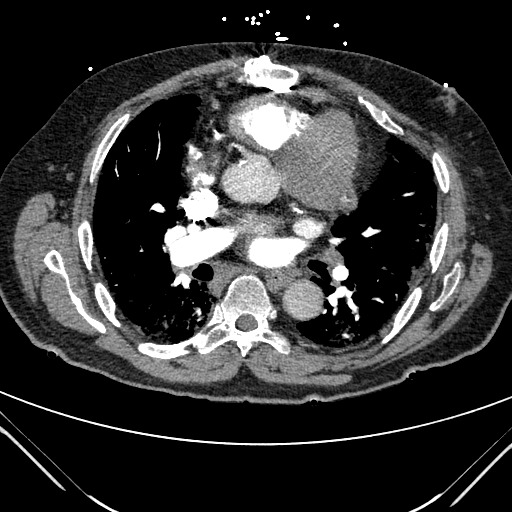
[im 163/267  lung]
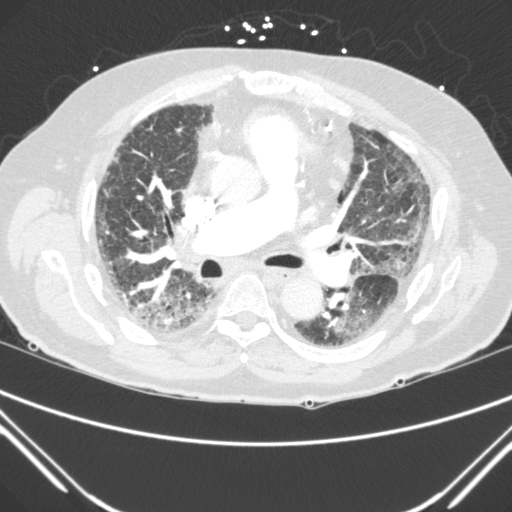
[im 178/267  mediastinal]
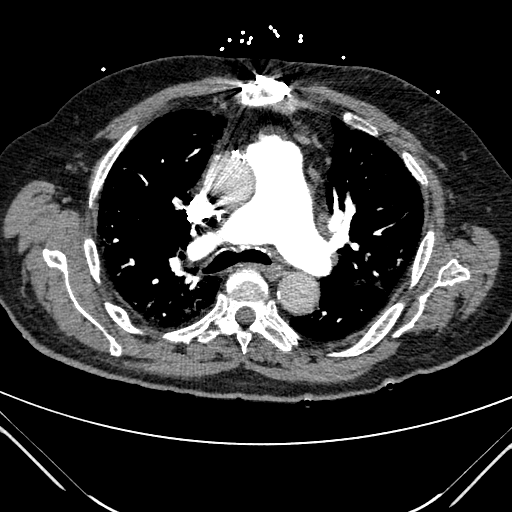
[im 193/267  lung]
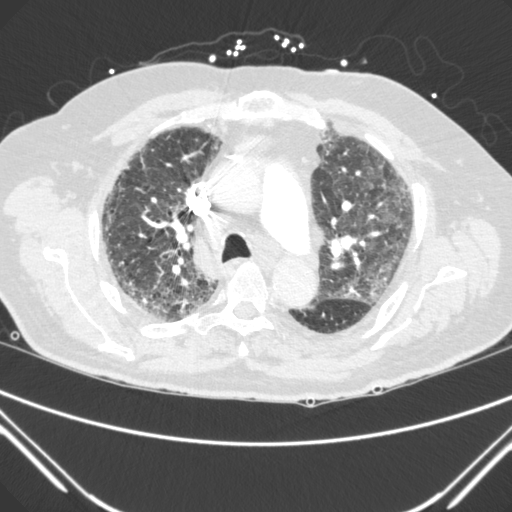
[im 207/267  mediastinal]
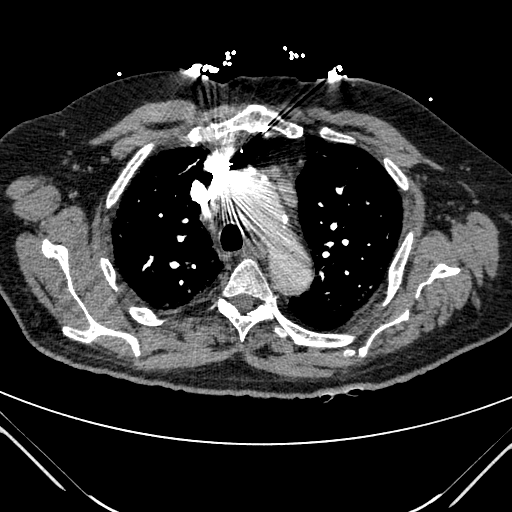
[im 222/267  lung]
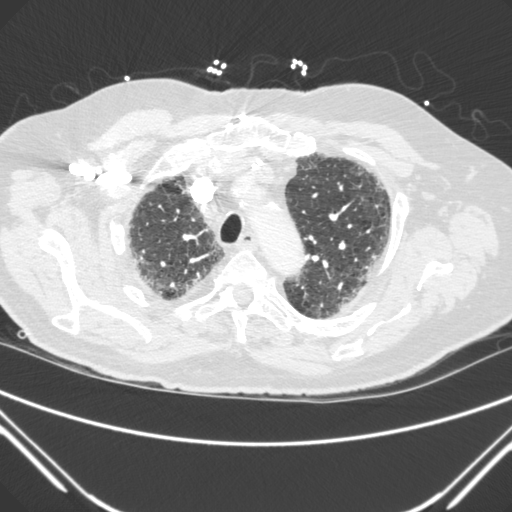
[im 237/267  mediastinal]
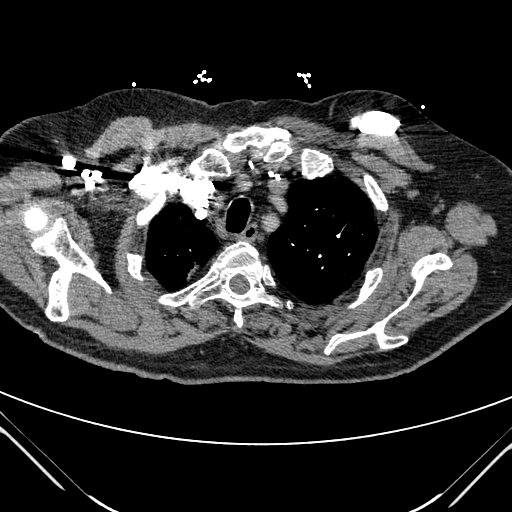
[im 252/267  lung]
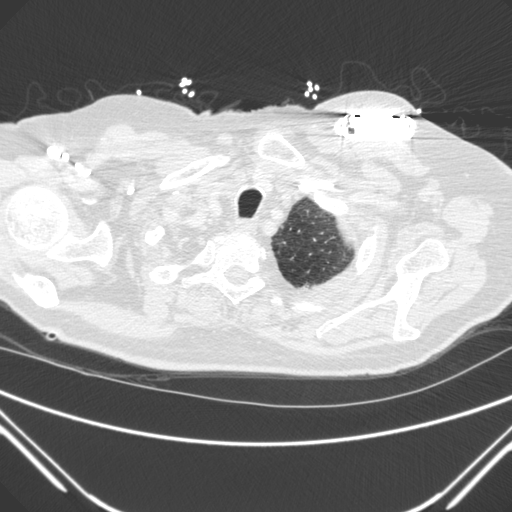

[mpr, coronals, coronal · coronal · 0.71mm/px · 1 of 118 slices shown]
[im 59/118  mediastinal]
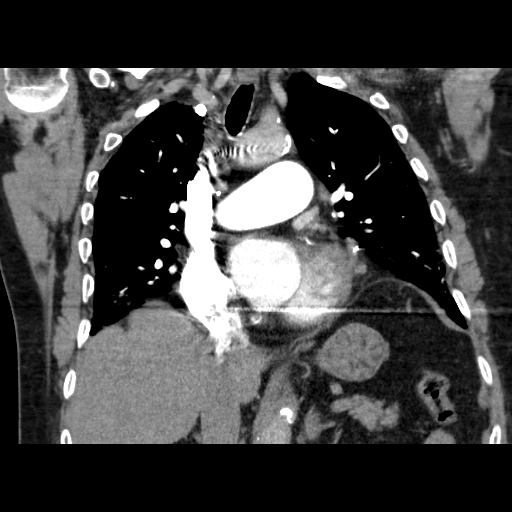

[18 of 36 positions shown; findings below may reference images not displayed]

FINDINGS: There are no pulmonary emboli.  There is cardiomegaly.
The main pulmonary arteries are prominent suggesting pulmonary
arterial hypertension.  Emphysematous changes are noted
bilaterally.

The patient has extensive interstitial and alveolar lung disease
with bronchiectasis at both lung bases.  No effusions.  Bilateral
hilar and mediastinal adenopathy is probably benign reactive
adenopathy. Does the patient have any history of malignancy?

Images of the upper abdomen demonstrate cholelithiasis.  Evidence
of prior CABG.  No acute osseous abnormalities.

Review of the MIP images confirms the above findings.
IMPRESSION: 1.  No acute pulmonary emboli.
2.  Cardiomegaly with enlargement of the pulmonary arteries
suggesting pulmonary arterial hypertension.
3.  Extensive interstitial and alveolar infiltrates primarily in
both lung bases superimposed on chronic interstitial and
obstructive lung disease as well as bronchiectasis.
4.  Mild mediastinal and hilar adenopathy which is most likely
reactive adenopathy due to the inflammatory changes in the lungs.
5.  Cholelithiasis.

## 2011-11-26 ENCOUNTER — Encounter: Payer: Self-pay | Admitting: Nurse Practitioner

## 2011-11-26 ENCOUNTER — Ambulatory Visit (INDEPENDENT_AMBULATORY_CARE_PROVIDER_SITE_OTHER): Payer: Medicare Other | Admitting: Nurse Practitioner

## 2011-11-26 VITALS — BP 98/78 | HR 66 | Ht 66.5 in | Wt 185.0 lb

## 2011-11-26 DIAGNOSIS — I25810 Atherosclerosis of coronary artery bypass graft(s) without angina pectoris: Secondary | ICD-10-CM

## 2011-11-26 DIAGNOSIS — I2589 Other forms of chronic ischemic heart disease: Secondary | ICD-10-CM

## 2011-11-26 DIAGNOSIS — F329 Major depressive disorder, single episode, unspecified: Secondary | ICD-10-CM

## 2011-11-26 DIAGNOSIS — F341 Dysthymic disorder: Secondary | ICD-10-CM

## 2011-11-26 DIAGNOSIS — I714 Abdominal aortic aneurysm, without rupture: Secondary | ICD-10-CM

## 2011-11-26 NOTE — Assessment & Plan Note (Signed)
I don't think he is really depressed. He continues to grieve for his wife. He is quite lonely. Not able to get out and be social due to his own health issues.  He agrees to seeing his PCP to discuss his Prozac and possible addition of other medicine.

## 2011-11-26 NOTE — Assessment & Plan Note (Signed)
No chest pain reported 

## 2011-11-26 NOTE — Assessment & Plan Note (Signed)
His EF has actually improved. I have talked with [name] after [name]'s last visit. We feel that his lungs are the primary issue. He is able to take low dose ARB and beta blocker. The Bystolic is not indicated for CHF but he does tolerate it fairly well. He has not been able to tolerate higher doses of his medicines due to symptomatic hypotension. We did discuss possible referral to the CHF clinic. He does not feel like this will help him. His fibrosis seems to be his most limiting factor at this time. We will see him back in about 6 weeks. Patient is agreeable to this plan and will call if any problems develop in the interim.

## 2011-11-26 NOTE — Patient Instructions (Signed)
Stay on your current medicines.  I want you to go see Dr. Lorin Picket to discuss your Prozac.  I will see you in 6 weeks.  Call the [facility] office at [phone] if you have any questions, problems or concerns.

## 2011-11-26 NOTE — Assessment & Plan Note (Signed)
Last duplex was in Jan 2013 showing slight increase at 4.6 x 5.0. He is followed at [facility].

## 2011-11-26 NOTE — Progress Notes (Signed)
[name] Date of Birth: [birth year] Medical Record #[id number]  History of Present Illness: [name] is seen back today for a follow up visit. He is seen for [name]. It is a 2 week check. He has an ischemic cardiomyopathy and has had CABG on 3 different occasions. He has an ICD in place. We have checked an echo and his EF is now up to 50%. He is able to tolerate low dose ARB. He is also on low dose Bystolic. He has severe pulmonary fibrosis. He is on 6l of oxygen with activity and 4l at rest.   He comes in today. He says he is doing ok. Still can't do a whole lot because of shortness of breath. He carries a pulse ox with him at all times. Still not able to drive. Still grieving terribly over his wife's death 2 years ago. 2021-03-04 was her birthday. He has been on Prozac for many many years. He admits that he cries a lot. He just misses her and is lonely.   He brings in his blood pressure readings. He is now taking the Losartan and Bystolic every day for the most part. He has had just a few times where his blood pressure was too low and he held his medicines. He has had a tendency towards hypotension with subsequent fall and injury in the past. We have reviewed his echo findings. EF has actually improved.   Current Outpatient Prescriptions on File Prior to Visit  Medication Sig Dispense Refill  . albuterol (PROVENTIL) (2.5 MG/3ML) 0.083% nebulizer solution Take 2.5 mg by nebulization. 2- 3 times daily      . aspirin 81 MG tablet Take 81 mg by mouth daily.        Marland Kitchen atorvastatin (LIPITOR) 40 MG tablet TAKE ONE TABLET DAILY.  30 tablet  5  . clopidogrel (PLAVIX) 75 MG tablet Take 1 tablet (75 mg total) by mouth daily.  30 tablet  2  . ezetimibe (ZETIA) 10 MG tablet Take 1 tablet (10 mg total) by mouth daily.  30 tablet  6  . FLUoxetine (PROZAC) 40 MG capsule Take 1 capsule (40 mg total) by mouth daily.  30 capsule  4  . Fluticasone-Salmeterol (ADVAIR DISKUS) 250-50 MCG/DOSE AEPB Inhale 1 puff  into the lungs 2 (two) times daily.  60 each  6  . furosemide (LASIX) 40 MG tablet Take 40 mg by mouth daily.      Marland Kitchen losartan (COZAAR) 25 MG tablet Take 1 tablet (25 mg total) by mouth daily.  30 tablet  6  . multivitamin (THERAGRAN) per tablet Take 1 tablet by mouth daily.        . nebivolol (BYSTOLIC) 2.5 MG tablet Take 1 tablet (2.5 mg total) by mouth daily.      . NON FORMULARY USE 4 LITERS OF O2 WHEN RESTING; HOWEVER USE 6 LITERS OF O2 ONLY WHEN O2 IS 85% OR LOWER WITH ACTIVITY.       Marland Kitchen pantoprazole (PROTONIX) 40 MG tablet Take 40 mg by mouth 2 (two) times daily.       . potassium chloride SA (K-DUR,KLOR-CON) 20 MEQ tablet Take 20 mEq by mouth daily.      . predniSONE (DELTASONE) 10 MG tablet Take 1 tablet by mouth Daily.      Marland Kitchen tiotropium (SPIRIVA HANDIHALER) 18 MCG inhalation capsule Place 1 capsule (18 mcg total) into inhaler and inhale daily.  30 capsule  2    Allergies  Allergen Reactions  .  Sulfonamide Derivatives     Past Medical History  Diagnosis Date  . CAD (coronary artery disease)     s/p CABG; s/p redo CABG in 1997 and 2007; cath 4/10: LM occluded, CFX occluded, old S-RCA occluded, old S-RCA 90%, left radial to RCA ok, S-CFX ok, L-LAD ok, 2 old SVGs stumped  . Hypertension   . Hyperlipidemia   . Ischemic cardiomyopathy     echo 7/12: EF 45-50%, severe LVH, mild LAE; EF 50% per echo 2/13  . Chronic systolic heart failure     EF is 24% in the past; EF 45-50% in 7/12  . Carotid stenosis     doppler 11/11: 20-39% bilat.  . ICD (implantable cardiac defibrillator) in place 2010  . Hypertensive heart disease   . COPD (chronic obstructive pulmonary disease)   . History of stroke   . Hiatal hernia   . AAA (abdominal aortic aneurysm)     followed by [facility]  . Pulmonary fibrosis     followed by [name]; on chronic oxygen.     Past Surgical History  Procedure Date  . Coronary artery bypass graft 2007    redo CABG x2 -- at that time had left radial artery graft to the  distal right coronary and saphenous vein graft to the distal circumflex.   . Coronary artery bypass graft 1997    redo surgery x2 --  in January 18, 1996, and at that time had a vein graft to the diagonal and OM and a vein graft to the posterior descending and  posterolateral branches   . Coronary artery bypass graft 1992    x2 -- left internal mammary to the LAD, a single vein graft to the diagonal - obtuse marginal system  and posterior descending  . Insert / replace / remove pacemaker 01/29/2009    Medtronic Virtuoso II DR four chamber AICD  . Cardiac catheterization 01/07/2009     performed by Dr. Deborah Chalk on January 07, 2009 revealing medically manageable coronary artery disease with reduced EF at 30%   . Hernia repair 1998    History  Smoking status  . Former Smoker -- 1.0 packs/day for 47 years  . Types: Cigarettes  . Quit date: 03/12/2003  Smokeless tobacco  . Never Used    History  Alcohol Use No    Family History  Problem Relation Age of Onset  . Heart attack Brother   . Coronary artery disease Brother   . Coronary artery disease Brother   . Heart disease Brother     Review of Systems: The review of systems is per the HPI.  All other systems were reviewed and are negative.  Physical Exam: BP 98/78  Pulse 66  Ht 5' 6.5" (1.689 m)  Wt 185 lb (83.915 kg)  BMI 29.41 kg/m2 Patient is very pleasant and in no acute distress. He has oxygen in place. His pulse ox is reading 96% on 4l. Skin is warm and dry. Color is normal.  HEENT is unremarkable. Normocephalic/atraumatic. PERRL. Sclera are nonicteric. Neck is supple. No masses. No JVD. Lungs are clear with decreased breath sounds. Cardiac exam shows a regular rate and rhythm. Abdomen is soft. Extremities are without edema. Gait and ROM are intact. No gross neurologic deficits noted.   LABORATORY DATA:   Assessment / Plan:

## 2011-12-05 ENCOUNTER — Encounter: Payer: Self-pay | Admitting: Internal Medicine

## 2011-12-06 ENCOUNTER — Ambulatory Visit (INDEPENDENT_AMBULATORY_CARE_PROVIDER_SITE_OTHER): Payer: Medicare Other | Admitting: *Deleted

## 2011-12-06 DIAGNOSIS — I428 Other cardiomyopathies: Secondary | ICD-10-CM

## 2011-12-06 DIAGNOSIS — I509 Heart failure, unspecified: Secondary | ICD-10-CM

## 2011-12-06 DIAGNOSIS — I5032 Chronic diastolic (congestive) heart failure: Secondary | ICD-10-CM

## 2011-12-07 LAB — REMOTE ICD DEVICE
BAMS-0001: 170 {beats}/min
BATTERY VOLTAGE: 3.0707 V
CHARGE TIME: 9.739 s
DEV-0020ICD: NEGATIVE
PACEART VT: 0
TZAT-0001ATACH: 1
TZAT-0001ATACH: 3
TZAT-0001FASTVT: 1
TZAT-0002ATACH: NEGATIVE
TZAT-0004SLOWVT: 8
TZAT-0004SLOWVT: 8
TZAT-0012ATACH: 150 ms
TZAT-0012ATACH: 150 ms
TZAT-0012SLOWVT: 200 ms
TZAT-0012SLOWVT: 200 ms
TZAT-0013SLOWVT: 2
TZAT-0013SLOWVT: 2
TZAT-0018ATACH: NEGATIVE
TZAT-0020ATACH: 1.5 ms
TZAT-0020ATACH: 1.5 ms
TZAT-0020SLOWVT: 1.5 ms
TZAT-0020SLOWVT: 1.5 ms
TZON-0003ATACH: 350 ms
TZON-0003SLOWVT: 340 ms
TZON-0004SLOWVT: 16
TZON-0005SLOWVT: 12
TZST-0001ATACH: 4
TZST-0001ATACH: 6
TZST-0001FASTVT: 2
TZST-0001FASTVT: 3
TZST-0001FASTVT: 4
TZST-0001SLOWVT: 5
TZST-0001SLOWVT: 6
TZST-0002ATACH: NEGATIVE
TZST-0002ATACH: NEGATIVE
TZST-0002FASTVT: NEGATIVE
TZST-0002FASTVT: NEGATIVE
TZST-0003SLOWVT: 35 J
TZST-0003SLOWVT: 35 J
VENTRICULAR PACING ICD: 0 pct
VF: 0

## 2011-12-12 NOTE — Progress Notes (Signed)
Remote icd check w/icm  

## 2011-12-17 ENCOUNTER — Other Ambulatory Visit: Payer: Self-pay | Admitting: Internal Medicine

## 2011-12-17 ENCOUNTER — Other Ambulatory Visit: Payer: Self-pay | Admitting: Critical Care Medicine

## 2011-12-17 ENCOUNTER — Other Ambulatory Visit: Payer: Self-pay | Admitting: Nurse Practitioner

## 2011-12-18 ENCOUNTER — Encounter: Payer: Self-pay | Admitting: *Deleted

## 2012-01-07 ENCOUNTER — Ambulatory Visit (INDEPENDENT_AMBULATORY_CARE_PROVIDER_SITE_OTHER): Payer: Medicare Other | Admitting: Nurse Practitioner

## 2012-01-07 ENCOUNTER — Encounter: Payer: Self-pay | Admitting: Nurse Practitioner

## 2012-01-07 VITALS — BP 108/80 | HR 82 | Ht 66.5 in | Wt 183.0 lb

## 2012-01-07 DIAGNOSIS — I714 Abdominal aortic aneurysm, without rupture: Secondary | ICD-10-CM

## 2012-01-07 DIAGNOSIS — I2589 Other forms of chronic ischemic heart disease: Secondary | ICD-10-CM

## 2012-01-07 DIAGNOSIS — J8410 Pulmonary fibrosis, unspecified: Secondary | ICD-10-CM

## 2012-01-07 DIAGNOSIS — R0609 Other forms of dyspnea: Secondary | ICD-10-CM

## 2012-01-07 DIAGNOSIS — R0600 Dyspnea, unspecified: Secondary | ICD-10-CM

## 2012-01-07 DIAGNOSIS — R0989 Other specified symptoms and signs involving the circulatory and respiratory systems: Secondary | ICD-10-CM

## 2012-01-07 LAB — BRAIN NATRIURETIC PEPTIDE: Pro B Natriuretic peptide (BNP): 91 pg/mL (ref 0.0–100.0)

## 2012-01-07 NOTE — Assessment & Plan Note (Signed)
He has planned follow up on Monday with [name].

## 2012-01-07 NOTE — Progress Notes (Signed)
[name] Date of Birth: Feb 20, 1936 Medical Record #[id number]  History of Present Illness: [name] is seen today for his 6 week check. He is seen for [name]. He has an ischemic cardiomyopathy. Has had CABG on 3 different occasions. Has his ICD in place. EF is up to 50% per echo earlier this year. His other issues include GERD, pulmonary fibrosis and depression. He is oxygen dependent.   He comes in today. He is here alone. He says he continues to improve. He has a little more energy. He has learned to pace his activities. He remains on oxygen anywhere from 4 to 6 liters. He has his pulse ox with him at all times. No chest pain. Weights have been stable at home. He has only used extra Lasix maybe just once since his last visit here. He is able to take the Bystolic every day but does hold his Losartan if his blood pressure is less than 120. He is prone to hypotension which has resulted in prior injury. He is driving short distances now. He has had some cramps in his hands. He is on potassium. He does remain depressed but not as tearful today as he has been in the past. He still grieves for his wife and misses her terribly.   Current Outpatient Prescriptions on File Prior to Visit  Medication Sig Dispense Refill  . albuterol (PROVENTIL) (2.5 MG/3ML) 0.083% nebulizer solution Take 2.5 mg by nebulization. 2- 3 times daily      . aspirin 81 MG tablet Take 81 mg by mouth daily.        Marland Kitchen atorvastatin (LIPITOR) 40 MG tablet TAKE ONE TABLET DAILY.  30 tablet  5  . clopidogrel (PLAVIX) 75 MG tablet Take 1 tablet (75 mg total) by mouth daily.  30 tablet  2  . FLUoxetine (PROZAC) 40 MG capsule TAKE 1 CAPSULE DAILY.  30 capsule  2  . Fluticasone-Salmeterol (ADVAIR DISKUS) 250-50 MCG/DOSE AEPB Inhale 1 puff into the lungs 2 (two) times daily.  60 each  6  . furosemide (LASIX) 40 MG tablet Take 40 mg by mouth daily.      . multivitamin (THERAGRAN) per tablet Take 1 tablet by mouth daily.        .  nebivolol (BYSTOLIC) 2.5 MG tablet Take 1 tablet (2.5 mg total) by mouth daily.      . NON FORMULARY USE 4 LITERS OF O2 WHEN RESTING; HOWEVER USE 6 LITERS OF O2 ONLY WHEN O2 IS 85% OR LOWER WITH ACTIVITY.       Marland Kitchen pantoprazole (PROTONIX) 40 MG tablet Take 40 mg by mouth 2 (two) times daily.       . potassium chloride SA (K-DUR,KLOR-CON) 20 MEQ tablet Take 20 mEq by mouth daily.      . predniSONE (DELTASONE) 10 MG tablet Take 1 tablet by mouth Daily.      Marland Kitchen SPIRIVA HANDIHALER 18 MCG inhalation capsule PLACE 1 CAPSULE INTO INHALER AND INHALE DAILY.  30 capsule  1  . ZETIA 10 MG tablet TAKE 1 TABLET ONCE DAILY.  30 tablet  6  . DISCONTD: losartan (COZAAR) 25 MG tablet Take 1 tablet (25 mg total) by mouth daily.  30 tablet  6    Allergies  Allergen Reactions  . Sulfonamide Derivatives     Past Medical History  Diagnosis Date  . CAD (coronary artery disease)     s/p CABG; s/p redo CABG in 1997 and 2007; cath 4/10: LM occluded, CFX occluded,  old S-RCA occluded, old S-RCA 90%, left radial to RCA ok, S-CFX ok, L-LAD ok, 2 old SVGs stumped  . Hypertension   . Hyperlipidemia   . Ischemic cardiomyopathy     echo 7/12: EF 45-50%, severe LVH, mild LAE; EF 50% per echo 2/13  . Chronic systolic heart failure     EF is 24% in the past; EF 45-50% in 7/12 and up to 50% in Feb 2013  . Carotid stenosis     doppler 11/11: 20-39% bilat.  . ICD (implantable cardiac defibrillator) in place 2010  . Hypertensive heart disease   . COPD (chronic obstructive pulmonary disease)   . History of stroke   . Hiatal hernia   . AAA (abdominal aortic aneurysm)     followed by [facility]  . Pulmonary fibrosis     followed by [name]; on chronic oxygen.   . Prolonged grief reaction     Past Surgical History  Procedure Date  . Coronary artery bypass graft 2007    redo CABG x2 -- at that time had left radial artery graft to the distal right coronary and saphenous vein graft to the distal circumflex.   . Coronary artery  bypass graft 1997    redo surgery x2 --  in January 18, 1996, and at that time had a vein graft to the diagonal and OM and a vein graft to the posterior descending and  posterolateral branches   . Coronary artery bypass graft 1992    x2 -- left internal mammary to the LAD, a single vein graft to the diagonal - obtuse marginal system  and posterior descending  . Insert / replace / remove pacemaker 01/29/2009    Medtronic Virtuoso II DR four chamber AICD  . Cardiac catheterization 01/07/2009     performed by Dr. Deborah Chalk on January 07, 2009 revealing medically manageable coronary artery disease with reduced EF at 30%   . Hernia repair 1998    History  Smoking status  . Former Smoker -- 1.0 packs/day for 47 years  . Types: Cigarettes  . Quit date: 03/12/2003  Smokeless tobacco  . Never Used    History  Alcohol Use No    Family History  Problem Relation Age of Onset  . Heart attack Brother   . Coronary artery disease Brother   . Coronary artery disease Brother   . Heart disease Brother     Review of Systems: The review of systems is per the HPI.  All other systems were reviewed and are negative.  Physical Exam: BP 108/80  Pulse 82  Ht 5' 6.5" (1.689 m)  Wt 183 lb (83.008 kg)  BMI 29.09 kg/m2 Patient is very pleasant and in no acute distress. He has oxygen in place. He does look stronger to me. Skin is warm and dry. Color is normal.  HEENT is unremarkable. Normocephalic/atraumatic. PERRL. Sclera are nonicteric. Neck is supple. No masses. No JVD. Lungs are clear. Cardiac exam shows a regular rate and rhythm. ICD in the left upper chest. Abdomen is soft. Extremities are without edema. Gait and ROM are intact. No gross neurologic deficits noted.  LABORATORY DATA: Pending.    Assessment / Plan:

## 2012-01-07 NOTE — Patient Instructions (Signed)
We are going to check your potassium level today.  I want to see you in 8 weeks  Call the [facility] office at [phone] if you have any questions, problems or concerns.

## 2012-01-07 NOTE — Assessment & Plan Note (Signed)
He has had slight increase at 4.6 x 5.0 per his duplex in January. He has follow up planned with Dr. Arbie Cookey in August.

## 2012-01-07 NOTE — Assessment & Plan Note (Signed)
He looks pretty good today. It is our feeling that he is more limited by the pulmonary fibrosis. His EF has improved. He is not able to tolerate more medicine due to hypotension which has resulted in prior injury. [name] is satisfied with how he is doing. He does not wish to go to the CHF clinic. We will check follow up labs today and will plan on seeing him back in 2 months. Patient is agreeable to this plan and will call if any problems develop in the interim.

## 2012-01-08 ENCOUNTER — Telehealth: Payer: Self-pay | Admitting: *Deleted

## 2012-01-08 LAB — BASIC METABOLIC PANEL
BUN: 30 mg/dL — ABNORMAL HIGH (ref 6–23)
CO2: 30 mEq/L (ref 19–32)
Calcium: 9.5 mg/dL (ref 8.4–10.5)
Chloride: 90 mEq/L — ABNORMAL LOW (ref 96–112)
Creatinine, Ser: 1.7 mg/dL — ABNORMAL HIGH (ref 0.4–1.5)
GFR: 41.8 mL/min — ABNORMAL LOW (ref >60.00)
Glucose, Bld: 502 mg/dL (ref 70–99)
Potassium: 5.2 mEq/L — ABNORMAL HIGH (ref 3.5–5.1)
Sodium: 132 mEq/L — ABNORMAL LOW (ref 135–145)

## 2012-01-08 NOTE — Telephone Encounter (Signed)
Message copied by Burnell Blanks on Tue Jan 08, 2012 12:08 PM ------      Message from: Rosalio Macadamia      Created: Tue Jan 08, 2012 11:56 AM       Please call. Stop the potassium. Blood sugar is grossly elevated. Needs to see his PCP asap for recheck. BNP looks great. Has chronic renal insufficiency.

## 2012-01-08 NOTE — Telephone Encounter (Signed)
Advised of labs and sent to PCP Dr Lorin Picket.  Patient verbalized understanding and will call there office for an appointment

## 2012-01-14 ENCOUNTER — Ambulatory Visit (INDEPENDENT_AMBULATORY_CARE_PROVIDER_SITE_OTHER): Payer: Medicare Other | Admitting: Critical Care Medicine

## 2012-01-14 ENCOUNTER — Encounter: Payer: Self-pay | Admitting: Critical Care Medicine

## 2012-01-14 VITALS — BP 124/88 | HR 84 | Temp 97.5°F | Ht 66.5 in | Wt 184.6 lb

## 2012-01-14 DIAGNOSIS — I2589 Other forms of chronic ischemic heart disease: Secondary | ICD-10-CM

## 2012-01-14 DIAGNOSIS — J8410 Pulmonary fibrosis, unspecified: Secondary | ICD-10-CM

## 2012-01-14 DIAGNOSIS — J439 Emphysema, unspecified: Secondary | ICD-10-CM

## 2012-01-14 DIAGNOSIS — J438 Other emphysema: Secondary | ICD-10-CM

## 2012-01-14 NOTE — Patient Instructions (Signed)
No change in inhaled or maintenance medications. Return in  4 months 

## 2012-01-14 NOTE — Progress Notes (Signed)
Subjective:    Patient ID: [name], male    DOB: Jul 19, 1936, 76 y.o.   MRN: [id number]  HPI  76 y.o.[facility] 7/10 -04/15/11 for CHF, resp failure, Ischemic CM 45-50%   COPD/ Pulm Fibrosis  D/c on oxygen.  CT neg for PE,  pulm infiltrates/fibrosis  01/14/2012 Dyspnea is same, feels better overall.  Is able to take bystolic daily, Losartan not taking every day. Still on oxygen  Pt denies any significant sore throat, nasal congestion or excess secretions, fever, chills, sweats, unintended weight loss, pleurtic or exertional chest pain, orthopnea PND, or leg swelling Pt denies any increase in rescue therapy over baseline, denies waking up needing it or having any early am or nocturnal exacerbations of coughing/wheezing/or dyspnea. Pt also denies any obvious fluctuation in symptoms with  weather or environmental change or other alleviating or aggravating factors    Past Medical History  Diagnosis Date  . CAD (coronary artery disease)     s/p CABG; s/p redo CABG in 1997 and 2007; cath 4/10: LM occluded, CFX occluded, old S-RCA occluded, old S-RCA 90%, left radial to RCA ok, S-CFX ok, L-LAD ok, 2 old SVGs stumped  . Hypertension   . Hyperlipidemia   . Ischemic cardiomyopathy     echo 7/12: EF 45-50%, severe LVH, mild LAE; EF 50% per echo 2/13  . Chronic systolic heart failure     EF is 24% in the past; EF 45-50% in 7/12 and up to 50% in Feb 2013  . Carotid stenosis     doppler 11/11: 20-39% bilat.  . ICD (implantable cardiac defibrillator) in place 2010  . Hypertensive heart disease   . COPD (chronic obstructive pulmonary disease)   . History of stroke   . Hiatal hernia   . AAA (abdominal aortic aneurysm)     followed by [facility]  . Pulmonary fibrosis     followed by [name]; on chronic oxygen.   . Prolonged grief reaction      Family History  Problem Relation Age of Onset  . Heart attack Brother   . Coronary artery disease Brother   . Coronary artery disease Brother   .  Heart disease Brother      History   Social History  . Marital Status: Married    Spouse Name: N/A    Number of Children: N/A  . Years of Education: N/A   Occupational History  . Not on file.   Social History Main Topics  . Smoking status: Former Smoker -- 1.0 packs/day for 47 years    Types: Cigarettes    Quit date: 03/12/2003  . Smokeless tobacco: Never Used  . Alcohol Use: No  . Drug Use: No  . Sexually Active: No   Other Topics Concern  . Not on file   Social History Narrative  . No narrative on file     Allergies  Allergen Reactions  . Sulfonamide Derivatives      Outpatient Prescriptions Prior to Visit  Medication Sig Dispense Refill  . albuterol (PROVENTIL) (2.5 MG/3ML) 0.083% nebulizer solution Take 2.5 mg by nebulization. 2- 3 times daily      . aspirin 81 MG tablet Take 81 mg by mouth daily.        Marland Kitchen atorvastatin (LIPITOR) 40 MG tablet TAKE ONE TABLET DAILY.  30 tablet  5  . clopidogrel (PLAVIX) 75 MG tablet Take 1 tablet (75 mg total) by mouth daily.  30 tablet  2  . FLUoxetine (PROZAC) 40  MG capsule TAKE 1 CAPSULE DAILY.  30 capsule  2  . Fluticasone-Salmeterol (ADVAIR DISKUS) 250-50 MCG/DOSE AEPB Inhale 1 puff into the lungs 2 (two) times daily.  60 each  6  . furosemide (LASIX) 40 MG tablet Take 40 mg by mouth daily.      Marland Kitchen losartan (COZAAR) 25 MG tablet Take 25 mg by mouth daily. If blood pressure is greater than 120 systolic      . multivitamin (THERAGRAN) per tablet Take 1 tablet by mouth daily.        . nebivolol (BYSTOLIC) 2.5 MG tablet Take 1 tablet (2.5 mg total) by mouth daily.      . NON FORMULARY USE 4 LITERS OF O2 WHEN RESTING; HOWEVER USE 6 LITERS OF O2 ONLY WHEN O2 IS 85% OR LOWER WITH ACTIVITY.       Marland Kitchen pantoprazole (PROTONIX) 40 MG tablet Take 40 mg by mouth 2 (two) times daily.       . predniSONE (DELTASONE) 10 MG tablet Take 1 tablet by mouth Daily.      Marland Kitchen SPIRIVA HANDIHALER 18 MCG inhalation capsule PLACE 1 CAPSULE INTO INHALER AND  INHALE DAILY.  30 capsule  1  . ZETIA 10 MG tablet TAKE 1 TABLET ONCE DAILY.  30 tablet  6      Review of Systems  Constitutional:   No  weight loss, night sweats,  Fevers, chills, fatigue, lassitude. HEENT:   No headaches,  Difficulty swallowing,  Tooth/dental problems,  Sore throat,                No sneezing, itching, ear ache, nasal congestion, post nasal drip,   CV:  No chest pain,  Orthopnea, PND, swelling in lower extremities, anasarca, dizziness, palpitations  GI  No heartburn, indigestion, abdominal pain, nausea, vomiting, diarrhea, change in bowel habits, loss of appetite  Resp: Notes  shortness of breath with exertion not  at rest.  No excess mucus, no productive cough,  No non-productive cough,  No coughing up of blood.  No change in color of mucus.  No wheezing.  No chest wall deformity  Skin: no rash or lesions.  GU: no dysuria, change in color of urine, no urgency or frequency.  No flank pain.  MS:  No joint pain or swelling.  No decreased range of motion.  No back pain.  Psych:  No change in mood or affect. No depression or anxiety.  No memory loss.     Objective:   Physical Exam  Filed Vitals:   01/14/12 1532 01/14/12 1539  BP: 124/88   Pulse: 84   Temp: 97.5 F (36.4 C)   TempSrc: Oral   Height: 5' 6.5" (1.689 m)   Weight: 184 lb 9.6 oz (83.734 kg)   SpO2: 81% 98%    Gen: Pleasant, well-nourished, in no distress,  normal affect  ENT: No lesions,  mouth clear,  oropharynx clear, no postnasal drip  Neck: No JVD, no TMG, no carotid bruits  Lungs: No use of accessory muscles, no dullness to percussion, distant bs   Cardiovascular: RRR, heart sounds normal, no murmur or gallops, 1+ peripheral edema  Abdomen: soft and NT, no HSM,  BS normal  Musculoskeletal: No deformities, no cyanosis or clubbing  Neuro: alert, non focal  Skin: Warm, no lesions or rashes        Assessment & Plan:   Pulmonary fibrosis, postinflammatory Stable  IPF/UIP Plan Cont prednisone Cont oxygen   COPD with emphysema Stable copd gold  D Plan Cont inhaled meds     Updated Medication List Outpatient Encounter Prescriptions as of 01/14/2012  Medication Sig Dispense Refill  . albuterol (PROVENTIL) (2.5 MG/3ML) 0.083% nebulizer solution Take 2.5 mg by nebulization. 2- 3 times daily      . aspirin 81 MG tablet Take 81 mg by mouth daily.        Marland Kitchen atorvastatin (LIPITOR) 40 MG tablet TAKE ONE TABLET DAILY.  30 tablet  5  . clopidogrel (PLAVIX) 75 MG tablet Take 1 tablet (75 mg total) by mouth daily.  30 tablet  2  . FLUoxetine (PROZAC) 40 MG capsule TAKE 1 CAPSULE DAILY.  30 capsule  2  . Fluticasone-Salmeterol (ADVAIR DISKUS) 250-50 MCG/DOSE AEPB Inhale 1 puff into the lungs 2 (two) times daily.  60 each  6  . furosemide (LASIX) 40 MG tablet Take 40 mg by mouth daily.      Marland Kitchen losartan (COZAAR) 25 MG tablet Take 25 mg by mouth daily. If blood pressure is greater than 120 systolic      . multivitamin (THERAGRAN) per tablet Take 1 tablet by mouth daily.        . nebivolol (BYSTOLIC) 2.5 MG tablet Take 1 tablet (2.5 mg total) by mouth daily.      . NON FORMULARY USE 4 LITERS OF O2 WHEN RESTING; HOWEVER USE 6 LITERS OF O2 ONLY WHEN O2 IS 85% OR LOWER WITH ACTIVITY.       Marland Kitchen pantoprazole (PROTONIX) 40 MG tablet Take 40 mg by mouth 2 (two) times daily.       . predniSONE (DELTASONE) 10 MG tablet Take 1 tablet by mouth Daily.      Marland Kitchen SPIRIVA HANDIHALER 18 MCG inhalation capsule PLACE 1 CAPSULE INTO INHALER AND INHALE DAILY.  30 capsule  1  . ZETIA 10 MG tablet TAKE 1 TABLET ONCE DAILY.  30 tablet  6

## 2012-01-15 NOTE — Assessment & Plan Note (Signed)
Stable IPF/UIP Plan Cont prednisone Cont oxygen

## 2012-01-15 NOTE — Assessment & Plan Note (Signed)
Stable copd gold D Plan Cont inhaled meds

## 2012-01-17 ENCOUNTER — Other Ambulatory Visit: Payer: Self-pay | Admitting: *Deleted

## 2012-01-17 MED ORDER — CLOPIDOGREL BISULFATE 75 MG PO TABS: 75.0000 mg | ORAL_TABLET | Freq: Every day | ORAL | Status: DC

## 2012-01-30 ENCOUNTER — Telehealth: Payer: Self-pay | Admitting: Nurse Practitioner

## 2012-01-30 NOTE — Telephone Encounter (Signed)
Advised pt to call PCP for appointment.

## 2012-01-30 NOTE — Telephone Encounter (Signed)
NURSE CASE MANAGER WITH [facility], AMANDA , NEEDS TO TALK TO YOU RE THIS PT

## 2012-02-13 ENCOUNTER — Telehealth: Payer: Self-pay

## 2012-02-13 NOTE — Telephone Encounter (Signed)
LMTCB

## 2012-02-13 NOTE — Telephone Encounter (Signed)
Should be ok. Does not have a recent stent. When you call him back, ask him if he got his blood sugar addressed. Thanks. Lawson Fiscal

## 2012-02-13 NOTE — Telephone Encounter (Signed)
Dr Salvatore Decent is planning to do a prostate biopsy on Mr Olliff.  He wants to know if Mr Bhat can come off his Plavix and ASA one week prior to the biopsy and for 4-5 days after the biopsy.  Biopsy date has not been set yet.  Please call back with a verbal yes or no to Harmony at [phone]

## 2012-02-13 NOTE — Telephone Encounter (Signed)
Notified Dr Suanne Marker office that it is ok to stop Plavix and ASA per Norma Fredrickson.  N/A at pt's number to check on blood sugar issues.  No answering machine at his number.

## 2012-02-14 ENCOUNTER — Telehealth: Payer: Self-pay | Admitting: *Deleted

## 2012-02-14 NOTE — Telephone Encounter (Signed)
Called pt to let him know we got his call--also wanted to know what type and how much insulin he is taking--N/A ON HOME PHONE--lm FOR HIM TO CALL BACK AND GIVE [place] HIS DOSAGE AND TYPE--NT

## 2012-02-14 NOTE — Telephone Encounter (Signed)
Follow-up:     Patient called in saying that he has gone to his PCP and he is on insulin now.  Please call back if you have any questions.

## 2012-02-29 ENCOUNTER — Other Ambulatory Visit: Payer: Self-pay | Admitting: *Deleted

## 2012-02-29 MED ORDER — TIOTROPIUM BROMIDE MONOHYDRATE 18 MCG IN CAPS: 18.0000 ug | ORAL_CAPSULE | Freq: Every day | RESPIRATORY_TRACT | Status: DC

## 2012-03-01 HISTORY — PX: OTHER SURGICAL HISTORY: SHX169

## 2012-03-06 ENCOUNTER — Encounter: Payer: Medicare Other | Admitting: *Deleted

## 2012-03-10 ENCOUNTER — Ambulatory Visit (INDEPENDENT_AMBULATORY_CARE_PROVIDER_SITE_OTHER): Payer: Medicare Other | Admitting: *Deleted

## 2012-03-10 ENCOUNTER — Encounter: Payer: Self-pay | Admitting: Nurse Practitioner

## 2012-03-10 ENCOUNTER — Ambulatory Visit (INDEPENDENT_AMBULATORY_CARE_PROVIDER_SITE_OTHER): Payer: Medicare Other | Admitting: Nurse Practitioner

## 2012-03-10 ENCOUNTER — Encounter: Payer: Self-pay | Admitting: Internal Medicine

## 2012-03-10 VITALS — BP 118/68 | HR 88 | Ht 66.5 in | Wt 183.8 lb

## 2012-03-10 DIAGNOSIS — R0602 Shortness of breath: Secondary | ICD-10-CM

## 2012-03-10 DIAGNOSIS — J8410 Pulmonary fibrosis, unspecified: Secondary | ICD-10-CM

## 2012-03-10 DIAGNOSIS — I714 Abdominal aortic aneurysm, without rupture, unspecified: Secondary | ICD-10-CM

## 2012-03-10 DIAGNOSIS — I2589 Other forms of chronic ischemic heart disease: Secondary | ICD-10-CM

## 2012-03-10 DIAGNOSIS — I255 Ischemic cardiomyopathy: Secondary | ICD-10-CM

## 2012-03-10 DIAGNOSIS — I5032 Chronic diastolic (congestive) heart failure: Secondary | ICD-10-CM

## 2012-03-10 NOTE — Progress Notes (Signed)
[name] Date of Birth: 1935/10/14 Medical Record #[id number]  History of Present Illness: [name] is seen back today for a 2 month check. He is seen for [name]. He has an ischemic cardiomyopathy. Has had CABG on 3 different occasions. Has an ICD in place. EF is now up to 50% per echo earlier this year. Other issues include GERD, pulmonary fibrosis and depression. He is oxygen dependent. Since his last visit he has had a significant UTI and was thought to have prostate cancer. He had a negative biopsy. He is now diabetic. He is prone to hypotension which has resulted in prior injury.   He comes in today. He is here alone. He remains on oxygen at 5 liters continuously. Weight has been creeping up a little. Blood pressure diary is reviewed. He is taking Bystolic every day and the Losartan only if his systolics are above 120. He has been placed on insulin and his sugars are coming down. He has been on chronic prednisone.  He remains depressed and tearful. Doesn't know when he needs his ICD checked. No shocks reported. Saw [name] shortly after his last visit with me and was felt to be stable. He is getting out a little more. Still with DOE but it seems to be at his baseline.   Current Outpatient Prescriptions on File Prior to Visit  Medication Sig Dispense Refill  . albuterol (PROVENTIL) (2.5 MG/3ML) 0.083% nebulizer solution Take 2.5 mg by nebulization. 2- 3 times daily      . aspirin 81 MG tablet Take 81 mg by mouth daily.        Marland Kitchen atorvastatin (LIPITOR) 40 MG tablet TAKE ONE TABLET DAILY.  30 tablet  5  . clopidogrel (PLAVIX) 75 MG tablet Take 1 tablet (75 mg total) by mouth daily.  30 tablet  6  . FLUoxetine (PROZAC) 40 MG capsule TAKE 1 CAPSULE DAILY.  30 capsule  2  . Fluticasone-Salmeterol (ADVAIR DISKUS) 250-50 MCG/DOSE AEPB Inhale 1 puff into the lungs 2 (two) times daily.  60 each  6  . furosemide (LASIX) 40 MG tablet Take 40 mg by mouth daily.      . insulin detemir (LEVEMIR)  100 UNIT/ML injection Inject 20 Units into the skin daily.      Marland Kitchen losartan (COZAAR) 25 MG tablet Take 25 mg by mouth daily. If blood pressure is greater than 120 systolic      . multivitamin (THERAGRAN) per tablet Take 1 tablet by mouth daily.        . nebivolol (BYSTOLIC) 2.5 MG tablet Take 1 tablet (2.5 mg total) by mouth daily.      . NON FORMULARY USE 4 LITERS OF O2 WHEN RESTING; HOWEVER USE 6 LITERS OF O2 ONLY WHEN O2 IS 85% OR LOWER WITH ACTIVITY.       Marland Kitchen pantoprazole (PROTONIX) 40 MG tablet Take 40 mg by mouth 2 (two) times daily.       . predniSONE (DELTASONE) 10 MG tablet Take 1 tablet by mouth Daily.      Marland Kitchen tiotropium (SPIRIVA HANDIHALER) 18 MCG inhalation capsule Place 1 capsule (18 mcg total) into inhaler and inhale daily.  30 capsule  6  . ZETIA 10 MG tablet TAKE 1 TABLET ONCE DAILY.  30 tablet  6    Allergies  Allergen Reactions  . Sulfonamide Derivatives     Past Medical History  Diagnosis Date  . CAD (coronary artery disease)     s/p CABG; s/p redo  CABG in 1997 and 2007; cath 4/10: LM occluded, CFX occluded, old S-RCA occluded, old S-RCA 90%, left radial to RCA ok, S-CFX ok, L-LAD ok, 2 old SVGs stumped  . Hypertension   . Hyperlipidemia   . Ischemic cardiomyopathy     echo 7/12: EF 45-50%, severe LVH, mild LAE; EF 50% per echo 2/13  . Chronic systolic heart failure     EF is 24% in the past; EF 45-50% in 7/12 and up to 50% in Feb 2013  . Carotid stenosis     doppler 11/11: 20-39% bilat.  . ICD (implantable cardiac defibrillator) in place 2010  . Hypertensive heart disease   . COPD (chronic obstructive pulmonary disease)   . History of stroke   . Hiatal hernia   . AAA (abdominal aortic aneurysm)     followed by [facility]  . Pulmonary fibrosis     followed by [name]; on chronic oxygen.   . Prolonged grief reaction   . Diabetes mellitus, insulin dependent (IDDM), controlled     Past Surgical History  Procedure Date  . Coronary artery bypass graft 2007    redo  CABG x2 -- at that time had left radial artery graft to the distal right coronary and saphenous vein graft to the distal circumflex.   . Coronary artery bypass graft 1997    redo surgery x2 --  in January 18, 1996, and at that time had a vein graft to the diagonal and OM and a vein graft to the posterior descending and  posterolateral branches   . Coronary artery bypass graft 1992    x2 -- left internal mammary to the LAD, a single vein graft to the diagonal - obtuse marginal system  and posterior descending  . Insert / replace / remove pacemaker 01/29/2009    Medtronic Virtuoso II DR four chamber AICD  . Cardiac catheterization 01/07/2009     performed by Dr. Deborah Chalk on January 07, 2009 revealing medically manageable coronary artery disease with reduced EF at 30%   . Hernia repair 1998    History  Smoking status  . Former Smoker -- 1.0 packs/day for 47 years  . Types: Cigarettes  . Quit date: 03/12/2003  Smokeless tobacco  . Never Used    History  Alcohol Use No    Family History  Problem Relation Age of Onset  . Heart attack Brother   . Coronary artery disease Brother   . Coronary artery disease Brother   . Heart disease Brother     Review of Systems: The review of systems is per the HPI.  All other systems were reviewed and are negative.  Physical Exam: BP 118/68  Pulse 88  Ht 5' 6.5" (1.689 m)  Wt 183 lb 12.8 oz (83.371 kg)  BMI 29.22 kg/m2  SpO2 98% Patient is very pleasant and in no acute distress. Skin is warm and dry. Color is normal.  HEENT is unremarkable. Normocephalic/atraumatic. PERRL. Sclera are nonicteric. Neck is supple. No masses. No JVD. Lungs are clear. Cardiac exam shows a regular rate and rhythm. Abdomen is soft. Extremities are without edema. Gait and ROM are intact. No gross neurologic deficits noted.   LABORATORY DATA:  ICD was checked today. Optivol level is currently rising.   Lab Results  Component Value Date   WBC 8.4 05/29/2011   HGB 11.1*  05/29/2011   HCT 34.4* 05/29/2011   PLT 190 05/29/2011   GLUCOSE 502* 01/07/2012   CHOL 143 03/12/2011   TRIG  148.0 03/12/2011   HDL 34.00* 03/12/2011   LDLCALC 79 03/12/2011   ALT 22 05/28/2011   AST 23 05/28/2011   NA 132* 01/07/2012   K 5.2* 01/07/2012   CL 90* 01/07/2012   CREATININE 1.7* 01/07/2012   BUN 30* 01/07/2012   CO2 30 01/07/2012   TSH 3.772 05/29/2011   INR 1.16 05/28/2011   HGBA1C  Value: 6.7 (NOTE)   The ADA recommends the following therapeutic goal for glycemic   control related to Hgb A1C measurement:   Goal of Therapy:   < 7.0% Hgb A1C   Reference: American Diabetes Association: Clinical Practice   Recommendations 2008, Diabetes Care,  2008, 31:(Suppl 1).* 09/29/2008     Assessment / Plan:

## 2012-03-10 NOTE — Assessment & Plan Note (Signed)
Has planned follow up with Dr. Arbie Cookey in August.

## 2012-03-10 NOTE — Progress Notes (Signed)
ICD check with ICM 

## 2012-03-10 NOTE — Patient Instructions (Addendum)
Stay on your current medicines  Use extra Lasix as needed for your weight for the next 3 days  I will see you in 3 months.   We are checking labs today  We will find out today when you need to get your ICD checked.   Call the [facility] office at [phone] if you have any questions, problems or concerns.

## 2012-03-10 NOTE — Assessment & Plan Note (Signed)
He remains on chronic oxygen therapy.

## 2012-03-10 NOTE — Assessment & Plan Note (Signed)
ICD check shows an increasing Optivol level today. Will check labs to include BNP. His weight is up a little at home. Will have him use an extra lasix for the next 3 days, then back to his regular dose. Unfortunately, he has not been able to tolerate more medicine due to hypotension which has resulted in injury. He has declined referral to the CHF clinic. I will tentatively see him back in 3 months. Patient is agreeable to this plan and will call if any problems develop in the interim.

## 2012-03-11 LAB — ICD DEVICE OBSERVATION
AL AMPLITUDE: 1.5 mv
AL IMPEDENCE ICD: 551 Ohm
ATRIAL PACING ICD: 69.41 pct
BAMS-0001: 170 {beats}/min
BATTERY VOLTAGE: 3.0435 V
FVT: 0
RV LEAD AMPLITUDE: 12.75 mv
RV LEAD IMPEDENCE ICD: 551 Ohm
RV LEAD THRESHOLD: 0.75 V
TOT-0006: 20100503000000
TZAT-0001ATACH: 2
TZAT-0001ATACH: 3
TZAT-0001SLOWVT: 2
TZAT-0004SLOWVT: 8
TZAT-0004SLOWVT: 8
TZAT-0012ATACH: 150 ms
TZAT-0012FASTVT: 200 ms
TZAT-0018FASTVT: NEGATIVE
TZAT-0019ATACH: 6 V
TZAT-0019SLOWVT: 8 V
TZAT-0019SLOWVT: 8 V
TZAT-0020ATACH: 1.5 ms
TZAT-0020ATACH: 1.5 ms
TZAT-0020ATACH: 1.5 ms
TZAT-0020FASTVT: 1.5 ms
TZAT-0020SLOWVT: 1.5 ms
TZAT-0020SLOWVT: 1.5 ms
TZON-0003VSLOWVT: 400 ms
TZON-0004VSLOWVT: 20
TZST-0001ATACH: 6
TZST-0001FASTVT: 2
TZST-0001FASTVT: 4
TZST-0001FASTVT: 5
TZST-0001FASTVT: 6
TZST-0001SLOWVT: 3
TZST-0001SLOWVT: 5
TZST-0002ATACH: NEGATIVE
TZST-0002FASTVT: NEGATIVE
TZST-0002FASTVT: NEGATIVE
TZST-0003SLOWVT: 35 J
TZST-0003SLOWVT: 35 J
TZST-0003SLOWVT: 35 J
VF: 0

## 2012-03-11 LAB — CBC WITH DIFFERENTIAL/PLATELET
Basophils Absolute: 0 10*3/uL (ref 0.0–0.1)
Basophils Relative: 0 % (ref 0.0–3.0)
Eosinophils Absolute: 0 10*3/uL (ref 0.0–0.7)
Eosinophils Relative: 0.3 % (ref 0.0–5.0)
HCT: 40.3 % (ref 39.0–52.0)
Hemoglobin: 13 g/dL (ref 13.0–17.0)
Lymphocytes Relative: 5.3 % — ABNORMAL LOW (ref 12.0–46.0)
Lymphs Abs: 0.6 10*3/uL — ABNORMAL LOW (ref 0.7–4.0)
MCHC: 32.3 g/dL (ref 30.0–36.0)
MCV: 95.3 fl (ref 78.0–100.0)
Monocytes Absolute: 0.4 10*3/uL (ref 0.1–1.0)
Monocytes Relative: 3.6 % (ref 3.0–12.0)
Neutro Abs: 10.6 10*3/uL — ABNORMAL HIGH (ref 1.4–7.7)
Neutrophils Relative %: 90.8 % — ABNORMAL HIGH (ref 43.0–77.0)
Platelets: 194 10*3/uL (ref 150.0–400.0)
RBC: 4.22 Mil/uL (ref 4.22–5.81)
RDW: 15.2 % — ABNORMAL HIGH (ref 11.5–14.6)
WBC: 11.7 10*3/uL — ABNORMAL HIGH (ref 4.5–10.5)

## 2012-03-11 LAB — BASIC METABOLIC PANEL
BUN: 21 mg/dL (ref 6–23)
CO2: 31 mEq/L (ref 19–32)
Calcium: 9.3 mg/dL (ref 8.4–10.5)
Chloride: 102 mEq/L (ref 96–112)
Creatinine, Ser: 1.6 mg/dL — ABNORMAL HIGH (ref 0.4–1.5)
GFR: 44.15 mL/min — ABNORMAL LOW (ref >60.00)
Glucose, Bld: 198 mg/dL — ABNORMAL HIGH (ref 70–99)
Potassium: 4.4 mEq/L (ref 3.5–5.1)
Sodium: 142 mEq/L (ref 135–145)

## 2012-03-11 LAB — BRAIN NATRIURETIC PEPTIDE: Pro B Natriuretic peptide (BNP): 238 pg/mL — ABNORMAL HIGH (ref 0.0–100.0)

## 2012-04-07 ENCOUNTER — Other Ambulatory Visit: Payer: Self-pay | Admitting: Internal Medicine

## 2012-04-22 ENCOUNTER — Other Ambulatory Visit: Payer: Self-pay | Admitting: Nurse Practitioner

## 2012-04-22 ENCOUNTER — Other Ambulatory Visit: Payer: Self-pay | Admitting: *Deleted

## 2012-04-22 MED ORDER — FUROSEMIDE 40 MG PO TABS: 40.0000 mg | ORAL_TABLET | Freq: Every day | ORAL | Status: DC

## 2012-04-22 MED ORDER — LOSARTAN POTASSIUM 25 MG PO TABS: 25.0000 mg | ORAL_TABLET | Freq: Every day | ORAL | Status: DC

## 2012-04-22 MED ORDER — ATORVASTATIN CALCIUM 40 MG PO TABS: 40.0000 mg | ORAL_TABLET | Freq: Every day | ORAL | Status: DC

## 2012-04-24 ENCOUNTER — Telehealth: Payer: Self-pay | Admitting: Critical Care Medicine

## 2012-04-24 NOTE — Telephone Encounter (Signed)
LMOMTCB x 1 

## 2012-04-25 NOTE — Telephone Encounter (Signed)
I spoke with the pt and he states the advair is a little expensive but he wants to continue it for now and discuss at follow-up with [name].Carron Curie, CMA

## 2012-04-28 ENCOUNTER — Other Ambulatory Visit: Payer: Self-pay | Admitting: *Deleted

## 2012-04-28 MED ORDER — PANTOPRAZOLE SODIUM 40 MG PO TBEC: 40.0000 mg | DELAYED_RELEASE_TABLET | Freq: Two times a day (BID) | ORAL | Status: DC

## 2012-05-06 ENCOUNTER — Encounter: Payer: Self-pay | Admitting: Neurosurgery

## 2012-05-06 ENCOUNTER — Other Ambulatory Visit: Payer: Self-pay | Admitting: Vascular Surgery

## 2012-05-06 ENCOUNTER — Ambulatory Visit: Payer: Medicare Other | Admitting: Vascular Surgery

## 2012-05-06 ENCOUNTER — Ambulatory Visit (INDEPENDENT_AMBULATORY_CARE_PROVIDER_SITE_OTHER): Payer: Medicare Other | Admitting: Neurosurgery

## 2012-05-06 ENCOUNTER — Encounter (INDEPENDENT_AMBULATORY_CARE_PROVIDER_SITE_OTHER): Payer: Medicare Other | Admitting: *Deleted

## 2012-05-06 VITALS — BP 146/100 | HR 68 | Resp 16 | Ht 66.5 in | Wt 177.0 lb

## 2012-05-06 DIAGNOSIS — I714 Abdominal aortic aneurysm, without rupture, unspecified: Secondary | ICD-10-CM | POA: Insufficient documentation

## 2012-05-06 LAB — BUN: BUN: 20 mg/dL (ref 6–23)

## 2012-05-06 LAB — CREATININE, SERUM: Creat: 1.52 mg/dL — ABNORMAL HIGH (ref 0.50–1.35)

## 2012-05-06 NOTE — Progress Notes (Signed)
[facility] [place] AAA/Carotid Office Note  CC: Six-month AAA surveillance Referring Physician: Early  History of Present Illness: 76 year old male patient of Dr. Arbie Cookey followed for known AAA. The patient denies any unusual back or abdominal pain. The patient denies any new medical diagnoses or recent surgeries, however the patient has a significant medical history including cardiac and respiratory issues.  Past Medical History  Diagnosis Date  . CAD (coronary artery disease)     s/p CABG; s/p redo CABG in 1997 and 2007; cath 4/10: LM occluded, CFX occluded, old S-RCA occluded, old S-RCA 90%, left radial to RCA ok, S-CFX ok, L-LAD ok, 2 old SVGs stumped  . Hypertension   . Hyperlipidemia   . Ischemic cardiomyopathy     echo 7/12: EF 45-50%, severe LVH, mild LAE; EF 50% per echo 2/13  . Chronic systolic heart failure     EF is 24% in the past; EF 45-50% in 7/12 and up to 50% in Feb 2013  . Carotid stenosis     doppler 11/11: 20-39% bilat.  . ICD (implantable cardiac defibrillator) in place 2010  . Hypertensive heart disease   . COPD (chronic obstructive pulmonary disease)   . History of stroke   . Hiatal hernia   . AAA (abdominal aortic aneurysm)     followed by [facility]  . Pulmonary fibrosis     followed by [name]; on chronic oxygen.   . Prolonged grief reaction   . Diabetes mellitus, insulin dependent (IDDM), controlled   . CHF (congestive heart failure)     ROS: [x]  Positive   [ ]  Denies    General: [ ]  Weight loss, [ ]  Fever, [ ]  chills Neurologic: [ ]  Dizziness, [ ]  Blackouts, [ ]  Seizure [ ]  Stroke, [ ]  "Mini stroke", [ ]  Slurred speech, [ ]  Temporary blindness; [ ]  weakness in arms or legs, [ ]  Hoarseness Cardiac: [ ]  Chest pain/pressure, [ ]  Shortness of breath at rest [ ]  Shortness of breath with exertion, [ ]  Atrial fibrillation or irregular heartbeat Vascular: [ ]  Pain in legs with walking, [ ]  Pain in legs at rest, [ ]  Pain in legs at night,  [ ]  Non-healing ulcer, [ ]  Blood clot in vein/DVT,   Pulmonary: [ ]  Home oxygen, [ ]  Productive cough, [ ]  Coughing up blood, [ ]  Asthma,  [ ]  Wheezing Musculoskeletal:  [ ]  Arthritis, [ ]  Low back pain, [ ]  Joint pain Hematologic: [ ]  Easy Bruising, [ ]  Anemia; [ ]  Hepatitis Gastrointestinal: [ ]  Blood in stool, [ ]  Gastroesophageal Reflux/heartburn, [ ]  Trouble swallowing Urinary: [ ]  chronic Kidney disease, [ ]  on HD - [ ]  MWF or [ ]  TTHS, [ ]  Burning with urination, [ ]  Difficulty urinating Skin: [ ]  Rashes, [ ]  Wounds Psychological: [ ]  Anxiety, [ ]  Depression   Social History History  Substance Use Topics  . Smoking status: Former Smoker -- 1.0 packs/day for 47 years    Types: Cigarettes    Quit date: 03/12/2003  . Smokeless tobacco: Never Used  . Alcohol Use: No    Family History Family History  Problem Relation Age of Onset  . Heart attack Brother   . Coronary artery disease Brother   . Coronary artery disease Brother   . Heart disease Brother     Allergies  Allergen Reactions  . Sulfonamide Derivatives Nausea And Vomiting    Current Outpatient Prescriptions  Medication Sig Dispense Refill  . albuterol (  PROVENTIL) (2.5 MG/3ML) 0.083% nebulizer solution Take 2.5 mg by nebulization. 2- 3 times daily      . aspirin 81 MG tablet Take 81 mg by mouth daily.        Marland Kitchen atorvastatin (LIPITOR) 40 MG tablet Take 1 tablet (40 mg total) by mouth daily.  30 tablet  11  . BYSTOLIC 2.5 MG tablet TAKE 1 TABLET ONCE DAILY.  30 tablet  12  . clopidogrel (PLAVIX) 75 MG tablet Take 1 tablet (75 mg total) by mouth daily.  30 tablet  6  . FLUoxetine (PROZAC) 40 MG capsule TAKE 1 CAPSULE DAILY.  30 capsule  3  . Fluticasone-Salmeterol (ADVAIR DISKUS) 250-50 MCG/DOSE AEPB Inhale 1 puff into the lungs 2 (two) times daily.  60 each  6  . furosemide (LASIX) 40 MG tablet Take 1 tablet (40 mg total) by mouth daily.  30 tablet  6  . insulin detemir (LEVEMIR) 100 UNIT/ML injection Inject 20 Units  into the skin daily.      Marland Kitchen losartan (COZAAR) 25 MG tablet Take 1 tablet (25 mg total) by mouth daily. If blood pressure is greater than 120 systolic  30 tablet  3  . multivitamin (THERAGRAN) per tablet Take 1 tablet by mouth daily.        . nebivolol (BYSTOLIC) 2.5 MG tablet Take 1 tablet (2.5 mg total) by mouth daily.      . NON FORMULARY USE 4 LITERS OF O2 WHEN RESTING; HOWEVER USE 6 LITERS OF O2 ONLY WHEN O2 IS 85% OR LOWER WITH ACTIVITY.       Marland Kitchen pantoprazole (PROTONIX) 40 MG tablet Take 1 tablet (40 mg total) by mouth 2 (two) times daily.  60 tablet  5  . predniSONE (DELTASONE) 10 MG tablet Take 1 tablet by mouth Daily.      Marland Kitchen tiotropium (SPIRIVA HANDIHALER) 18 MCG inhalation capsule Place 1 capsule (18 mcg total) into inhaler and inhale daily.  30 capsule  6  . ZETIA 10 MG tablet TAKE 1 TABLET ONCE DAILY.  30 tablet  6    Physical Examination  Filed Vitals:   05/06/12 1043  BP: 146/100  Pulse: 68  Resp: 16    Body mass index is 28.14 kg/(m^2).  General:  WDWN in NAD Gait: Normal HEENT: WNL Eyes: Pupils equal Pulmonary: normal non-labored breathing , without Rales, rhonchi,  wheezing Cardiac: RRR, without  Murmurs, rubs or gallops; Abdomen: soft, NT, no masses Skin: no rashes, ulcers noted  Vascular Exam Pulses: 3+ radial pulses bilaterally Carotid bruits are absent, pulsatile palpable abdominal mass Extremities without ischemic changes, no Gangrene , no cellulitis; no open wounds;  Musculoskeletal: no muscle wasting or atrophy   Neurologic: A&O X 3; Appropriate Affect ; SENSATION: normal; MOTOR FUNCTION:  moving all extremities equally. Speech is fluent/normal  Non-Invasive Vascular Imaging CAROTID DUPLEX 05/06/2012  N/A AAA duplex shows a maximum diameter of 5.43 which is increased from 5.01 in January 2013    ASSESSMENT/PLAN: AAA that is enlarging, Dr. Arbie Cookey spoke with the patient at length regarding the size increase of the AAA. Dr. Arbie Cookey his recommendation is the  patient have a CTA to evaluate the AAA better and return to see him for a treatment plan in the next 2-3 weeks. The patient's in agreement with this, his questions were encouraged and answered.  Lauree Chandler ANP   Clinic MD: Early

## 2012-05-12 ENCOUNTER — Ambulatory Visit: Payer: Medicare Other | Admitting: Nurse Practitioner

## 2012-05-19 ENCOUNTER — Encounter: Payer: Self-pay | Admitting: Vascular Surgery

## 2012-05-20 ENCOUNTER — Telehealth: Payer: Self-pay | Admitting: Critical Care Medicine

## 2012-05-20 ENCOUNTER — Ambulatory Visit: Payer: Medicare Other | Admitting: Vascular Surgery

## 2012-05-20 ENCOUNTER — Encounter: Payer: Self-pay | Admitting: Vascular Surgery

## 2012-05-20 ENCOUNTER — Ambulatory Visit (INDEPENDENT_AMBULATORY_CARE_PROVIDER_SITE_OTHER): Payer: Medicare Other | Admitting: Vascular Surgery

## 2012-05-20 ENCOUNTER — Ambulatory Visit
Admission: RE | Admit: 2012-05-20 | Discharge: 2012-05-20 | Disposition: A | Discharge status: Home / Self Care | Payer: Medicare Other | Source: Ambulatory Visit | Attending: Vascular Surgery | Admitting: Vascular Surgery

## 2012-05-20 VITALS — BP 132/87 | HR 87 | Resp 18 | Ht 66.0 in | Wt 183.0 lb

## 2012-05-20 DIAGNOSIS — I714 Abdominal aortic aneurysm, without rupture: Secondary | ICD-10-CM

## 2012-05-20 MED ORDER — IOHEXOL 350 MG/ML SOLN
80.0000 mL | Freq: Once | INTRAVENOUS | Status: AC | PRN
  Administered 2012-05-20: 80 mL via INTRAVENOUS

## 2012-05-20 MED ORDER — PREDNISONE 10 MG PO TABS: 10.0000 mg | ORAL_TABLET | Freq: Every day | ORAL | Status: DC

## 2012-05-20 NOTE — Progress Notes (Signed)
Patient has today for discussion regarding his abdominal aortic aneurysm. We've seen in the each ago with duplex suggesting increase in size and is here today with CT scan followup. His physical exam and past history is unchanged from his 05/06/2012 evaluation. CT scan today shows a maximal diameter of his aorta aneurysm of 6 cm. He does have some angulation of his infrarenal aortic neck and does appear to have adequate length for stent grafting. He does have ectasia in his iliac arteries bilaterally but again appears to be a good candidate for stent grafting with good arch vessels.  Past Medical History  Diagnosis Date  . CAD (coronary artery disease)     s/p CABG; s/p redo CABG in 1997 and 2007; cath 4/10: LM occluded, CFX occluded, old S-RCA occluded, old S-RCA 90%, left radial to RCA ok, S-CFX ok, L-LAD ok, 2 old SVGs stumped  . Hypertension   . Hyperlipidemia   . Ischemic cardiomyopathy     echo 7/12: EF 45-50%, severe LVH, mild LAE; EF 50% per echo 2/13  . Chronic systolic heart failure     EF is 24% in the past; EF 45-50% in 7/12 and up to 50% in Feb 2013  . Carotid stenosis     doppler 11/11: 20-39% bilat.  . ICD (implantable cardiac defibrillator) in place 2010  . Hypertensive heart disease   . COPD (chronic obstructive pulmonary disease)   . History of stroke   . Hiatal hernia   . AAA (abdominal aortic aneurysm)     followed by [facility]  . Pulmonary fibrosis     followed by [name]; on chronic oxygen.   . Prolonged grief reaction   . Diabetes mellitus, insulin dependent (IDDM), controlled   . CHF (congestive heart failure)     History  Substance Use Topics  . Smoking status: Former Smoker -- 1.0 packs/day for 47 years    Types: Cigarettes    Quit date: 03/12/2003  . Smokeless tobacco: Never Used  . Alcohol Use: No    Family History  Problem Relation Age of Onset  . Heart attack Brother   . Coronary artery disease Brother   . Coronary artery disease Brother   . Heart  disease Brother     Allergies  Allergen Reactions  . Sulfonamide Derivatives Nausea And Vomiting    Current outpatient prescriptions:albuterol (PROVENTIL) (2.5 MG/3ML) 0.083% nebulizer solution, Take 2.5 mg by nebulization. 2- 3 times daily, Disp: , Rfl: ;  aspirin 81 MG tablet, Take 81 mg by mouth daily.  , Disp: , Rfl: ;  atorvastatin (LIPITOR) 40 MG tablet, Take 1 tablet (40 mg total) by mouth daily., Disp: 30 tablet, Rfl: 11;  BYSTOLIC 2.5 MG tablet, TAKE 1 TABLET ONCE DAILY., Disp: 30 tablet, Rfl: 12 clopidogrel (PLAVIX) 75 MG tablet, Take 1 tablet (75 mg total) by mouth daily., Disp: 30 tablet, Rfl: 6;  FLUoxetine (PROZAC) 40 MG capsule, TAKE 1 CAPSULE DAILY., Disp: 30 capsule, Rfl: 3;  Fluticasone-Salmeterol (ADVAIR DISKUS) 250-50 MCG/DOSE AEPB, Inhale 1 puff into the lungs 2 (two) times daily., Disp: 60 each, Rfl: 6;  furosemide (LASIX) 40 MG tablet, Take 1 tablet (40 mg total) by mouth daily., Disp: 30 tablet, Rfl: 6 insulin detemir (LEVEMIR) 100 UNIT/ML injection, Inject 20 Units into the skin daily., Disp: , Rfl: ;  losartan (COZAAR) 25 MG tablet, Take 1 tablet (25 mg total) by mouth daily. If blood pressure is greater than 120 systolic, Disp: 30 tablet, Rfl: 3;  multivitamin (THERAGRAN) per  tablet, Take 1 tablet by mouth daily.  , Disp: , Rfl: ;  nebivolol (BYSTOLIC) 2.5 MG tablet, Take 1 tablet (2.5 mg total) by mouth daily., Disp: , Rfl:  NON FORMULARY, USE 4 LITERS OF O2 WHEN RESTING; HOWEVER USE 6 LITERS OF O2 ONLY WHEN O2 IS 85% OR LOWER WITH ACTIVITY. , Disp: , Rfl: ;  pantoprazole (PROTONIX) 40 MG tablet, Take 1 tablet (40 mg total) by mouth 2 (two) times daily., Disp: 60 tablet, Rfl: 5;  predniSONE (DELTASONE) 10 MG tablet, Take 1 tablet (10 mg total) by mouth daily., Disp: 40 tablet, Rfl: 6 tiotropium (SPIRIVA HANDIHALER) 18 MCG inhalation capsule, Place 1 capsule (18 mcg total) into inhaler and inhale daily., Disp: 30 capsule, Rfl: 6;  ZETIA 10 MG tablet, TAKE 1 TABLET ONCE DAILY.,  Disp: 30 tablet, Rfl: 6 No current facility-administered medications for this visit. Facility-Administered Medications Ordered in Other Visits: iohexol (OMNIPAQUE) 350 MG/ML injection 80 mL, 80 mL, Intravenous, Once PRN, Juline Patch, MD, 80 mL at 05/20/12 1403  BP 132/87  Pulse 87  Resp 18  Ht 5\' 6"  (1.676 m)  Wt 183 lb (83.008 kg)  BMI 29.54 kg/m2  Body mass index is 29.54 kg/(m^2).       Physical exam unchanged. Alert oriented gentleman with continuous oxygen therapy. Abdomen is obese but nontender. He does have 2+ femoral pulses.  Impression and plan 6.0 cm infrarenal abdominal aortic aneurysm probably accessible by stent grafting. I explained that we will do a formal lyses measurements to determine the prep for treatment. He is to see cardiology for further evaluation with a prior scheduled appointment on September 9. Assuming he is cleared for stent graft and we will proceed with the procedure and otherwise will await for cardiac clearance.

## 2012-05-20 NOTE — Telephone Encounter (Signed)
Pt last seen by PW 4.15.13, follow up in 4 months > 9.17.13.  Pt is currently on prednisone 10mg  daily and advised to continue this per last ov.  Last refilled 11.26.13 by PW at [facility] for #40 with 6 refills.   Called [employer], spoke with Trula Ore and authorized refills as last filled by PW for #40 with 6 refills.  Med list updated.  Will sign off.

## 2012-06-09 ENCOUNTER — Ambulatory Visit: Payer: Medicare Other | Admitting: Nurse Practitioner

## 2012-06-10 ENCOUNTER — Encounter: Payer: Self-pay | Admitting: Nurse Practitioner

## 2012-06-10 ENCOUNTER — Ambulatory Visit (INDEPENDENT_AMBULATORY_CARE_PROVIDER_SITE_OTHER): Payer: Medicare Other | Admitting: Nurse Practitioner

## 2012-06-10 VITALS — BP 130/88 | HR 69 | Ht 66.0 in | Wt 184.8 lb

## 2012-06-10 DIAGNOSIS — I2589 Other forms of chronic ischemic heart disease: Secondary | ICD-10-CM

## 2012-06-10 DIAGNOSIS — Z01818 Encounter for other preprocedural examination: Secondary | ICD-10-CM

## 2012-06-10 NOTE — Patient Instructions (Addendum)
I will send a note to Dr. Arbie Cookey  See [name] next week  Go to the [facility] to pick up your handicap sticker. We will certainly sign for you  I want to see you in November  Stay on your current medicines  Call the [facility] office at [phone] if you have any questions, problems or concerns.

## 2012-06-10 NOTE — Progress Notes (Signed)
[name] Date of Birth: 1936/08/18 Medical Record #[id number]  History of Present Illness: [name] is seen back today for a 3 month check. He is seen for [name]. He has an ischemic CM. Has had CABG on 3 different occasions. Has an ICD in place. EF is now up to 50% per echo earlier this year in February. Other issues include GERD, pulmonary fibrosis, depression, diabetes and known AAA. He is oxygen dependent. He will take Bystolic every day and the Losartan only if his BP is above 120 systolic. He has been prone to hypotension which has resulted in injury.   He comes in today. He is here with his daughter [name]. He remains on oxygen. He has had his follow up for his AAA with Dr. Arbie Cookey. This has progressed to 6 cm. He is felt to be a candidate for stent grafting. He is going to proceed but admits he may "back out". He is not having chest pain. He remains short of breath but seems to be at his baseline. He is basically on 5 liters of oxygen. His breathing does get worse with minimal exertion. He remains quite depressed. Remains very tearful. His daughter has moved in with him and is trying to get him out more. She admits that he just seems over consumed in grief. His wife died 2 1/2 years ago. I do not think he has talked with his PCP about changing the Prozac. Blood sugars are coming down. He remains on very low dose Bystoic and only takes the Losartan if systolic above 120.   Current Outpatient Prescriptions on File Prior to Visit  Medication Sig Dispense Refill  . albuterol (PROVENTIL) (2.5 MG/3ML) 0.083% nebulizer solution Take 2.5 mg by nebulization. 2- 3 times daily      . aspirin 81 MG tablet Take 81 mg by mouth daily.        Marland Kitchen atorvastatin (LIPITOR) 40 MG tablet Take 1 tablet (40 mg total) by mouth daily.  30 tablet  11  . BYSTOLIC 2.5 MG tablet TAKE 1 TABLET ONCE DAILY.  30 tablet  12  . clopidogrel (PLAVIX) 75 MG tablet Take 1 tablet (75 mg total) by mouth daily.  30 tablet  6  .  FLUoxetine (PROZAC) 40 MG capsule TAKE 1 CAPSULE DAILY.  30 capsule  3  . Fluticasone-Salmeterol (ADVAIR DISKUS) 250-50 MCG/DOSE AEPB Inhale 1 puff into the lungs 2 (two) times daily.  60 each  6  . furosemide (LASIX) 40 MG tablet Take 1 tablet (40 mg total) by mouth daily.  30 tablet  6  . insulin detemir (LEVEMIR) 100 UNIT/ML injection Inject 20 Units into the skin daily.      Marland Kitchen losartan (COZAAR) 25 MG tablet Take 1 tablet (25 mg total) by mouth daily. If blood pressure is greater than 120 systolic  30 tablet  3  . multivitamin (THERAGRAN) per tablet Take 1 tablet by mouth daily.        . nateglinide (STARLIX) 60 MG tablet Take 60 mg by mouth daily.       . nebivolol (BYSTOLIC) 2.5 MG tablet Take 1 tablet (2.5 mg total) by mouth daily.      . NON FORMULARY USE 4 LITERS OF O2 WHEN RESTING; HOWEVER USE 6 LITERS OF O2 ONLY WHEN O2 IS 85% OR LOWER WITH ACTIVITY.       Marland Kitchen pantoprazole (PROTONIX) 40 MG tablet Take 1 tablet (40 mg total) by mouth 2 (two) times daily.  60 tablet  5  . predniSONE (DELTASONE) 10 MG tablet Take 1 tablet (10 mg total) by mouth daily.  40 tablet  6  . tiotropium (SPIRIVA HANDIHALER) 18 MCG inhalation capsule Place 1 capsule (18 mcg total) into inhaler and inhale daily.  30 capsule  6  . ZETIA 10 MG tablet TAKE 1 TABLET ONCE DAILY.  30 tablet  6    Allergies  Allergen Reactions  . Sulfonamide Derivatives Nausea And Vomiting    Past Medical History  Diagnosis Date  . CAD (coronary artery disease)     s/p CABG; s/p redo CABG in 1997 and 2007; cath 4/10: LM occluded, CFX occluded, old S-RCA occluded, old S-RCA 90%, left radial to RCA ok, S-CFX ok, L-LAD ok, 2 old SVGs stumped  . Hypertension   . Hyperlipidemia   . Ischemic cardiomyopathy     echo 7/12: EF 45-50%, severe LVH, mild LAE; EF 50% per echo 2/13  . Chronic systolic heart failure     EF is 24% in the past; EF 45-50% in 7/12 and up to 50% in Feb 2013  . Carotid stenosis     doppler 11/11: 20-39% bilat.  . ICD  (implantable cardiac defibrillator) in place 2010  . Hypertensive heart disease   . COPD (chronic obstructive pulmonary disease)   . History of stroke   . Hiatal hernia   . AAA (abdominal aortic aneurysm)     followed by [facility]  . Pulmonary fibrosis     followed by [name]; on chronic oxygen.   . Prolonged grief reaction   . Diabetes mellitus, insulin dependent (IDDM), controlled   . CHF (congestive heart failure)     Past Surgical History  Procedure Date  . Coronary artery bypass graft 2007    redo CABG x2 -- at that time had left radial artery graft to the distal right coronary and saphenous vein graft to the distal circumflex.   . Coronary artery bypass graft 1997    redo surgery x2 --  in January 18, 1996, and at that time had a vein graft to the diagonal and OM and a vein graft to the posterior descending and  posterolateral branches   . Coronary artery bypass graft 1992    x2 -- left internal mammary to the LAD, a single vein graft to the diagonal - obtuse marginal system  and posterior descending  . Insert / replace / remove pacemaker 01/29/2009    [employer]onic Virtuoso II DR four chamber AICD  . Cardiac catheterization 01/07/2009     performed by Dr. Deborah Chalk on January 07, 2009 revealing medically manageable coronary artery disease with reduced EF at 30%   . Hernia repair 1998  . Prostate test June 2013  . Exploration post operative open heart 2007    History  Smoking status  . Former Smoker -- 1.0 packs/day for 47 years  . Types: Cigarettes  . Quit date: 03/12/2003  Smokeless tobacco  . Never Used    History  Alcohol Use No    Family History  Problem Relation Age of Onset  . Heart attack Brother   . Coronary artery disease Brother   . Coronary artery disease Brother   . Heart disease Brother     Review of Systems: The review of systems is per the HPI.  All other systems were reviewed and are negative.  Physical Exam: BP 130/88  Pulse 69  Ht 5\' 6"  (1.676 m)   Wt 184 lb 12.8 oz (83.825 kg)  BMI  29.83 kg/m2 Patient is very pleasant and in no acute distress. He has oxygen in place. Does get short of breath with walking here in the office. Skin is warm and dry. Color is normal.  HEENT is unremarkable. Normocephalic/atraumatic. PERRL. Sclera are nonicteric. Neck is supple. No masses. No JVD. Lungs are clear. Cardiac exam shows a regular rate and rhythm. Abdomen is soft. Extremities are without edema. Gait and ROM are intact. No gross neurologic deficits noted.   LABORATORY DATA:  Lab Results  Component Value Date   WBC 11.7* 03/10/2012   HGB 13.0 03/10/2012   HCT 40.3 03/10/2012   PLT 194.0 03/10/2012   GLUCOSE 198* 03/10/2012   CHOL 143 03/12/2011   TRIG 148.0 03/12/2011   HDL 34.00* 03/12/2011   LDLCALC 79 03/12/2011   ALT 22 05/28/2011   AST 23 05/28/2011   NA 142 03/10/2012   K 4.4 03/10/2012   CL 102 03/10/2012   CREATININE 1.52* 05/06/2012   BUN 20 05/06/2012   CO2 31 03/10/2012   TSH 3.772 05/29/2011   INR 1.16 05/28/2011   HGBA1C  Value: 6.7 (NOTE)   The ADA recommends the following therapeutic goal for glycemic   control related to Hgb A1C measurement:   Goal of Therapy:   < 7.0% Hgb A1C   Reference: American Diabetes Association: Clinical Practice   Recommendations 2008, Diabetes Care,  2008, 31:(Suppl 1).* 09/29/2008     Assessment / Plan: 1. Ischemic CM - EF is up to 50 % per echo in February of this year. No active chest pain reported.   2. Pulmonary fibrosis - oxygen dependent. Sees [name] next week  3. Depression - this has been an ongoing issue.   4. AAA - now with need for stent grafting - I have talked with [name]. [name] does not have much in the way of options, either he will proceed or not proceed with stent grafting. His cardiac status seems stable at this time and he is not having active chest pain. He is not felt to need further testing from our standpoint. He may stop his Plavix for 5 days. He understands that due to his many  health issues and especially his pulmonary fibrosis, that his risk is increased. He needs to remain on his current medicines including the low dose beta blocker. We will be available as needed for his hospital care. He is seeing [name] next week for evaluation as well. I will plan on seeing him back in mid November.   Patient is agreeable to this plan and will call if any problems develop in the interim.

## 2012-06-12 ENCOUNTER — Telehealth: Payer: Self-pay | Admitting: *Deleted

## 2012-06-12 NOTE — Telephone Encounter (Signed)
Application for Disability Parking Placard mailed to the patient.

## 2012-06-17 ENCOUNTER — Encounter: Payer: Self-pay | Admitting: Critical Care Medicine

## 2012-06-17 ENCOUNTER — Ambulatory Visit (INDEPENDENT_AMBULATORY_CARE_PROVIDER_SITE_OTHER): Payer: Medicare Other | Admitting: Critical Care Medicine

## 2012-06-17 ENCOUNTER — Ambulatory Visit (INDEPENDENT_AMBULATORY_CARE_PROVIDER_SITE_OTHER)
Admission: RE | Admit: 2012-06-17 | Discharge: 2012-06-17 | Disposition: A | Discharge status: Home / Self Care | Payer: Medicare Other | Source: Ambulatory Visit | Attending: Critical Care Medicine | Admitting: Critical Care Medicine

## 2012-06-17 VITALS — BP 126/90 | HR 73 | Temp 97.6°F | Ht 66.5 in | Wt 187.0 lb

## 2012-06-17 DIAGNOSIS — I798 Other disorders of arteries, arterioles and capillaries in diseases classified elsewhere: Secondary | ICD-10-CM

## 2012-06-17 DIAGNOSIS — J438 Other emphysema: Secondary | ICD-10-CM

## 2012-06-17 DIAGNOSIS — E1151 Type 2 diabetes mellitus with diabetic peripheral angiopathy without gangrene: Secondary | ICD-10-CM

## 2012-06-17 DIAGNOSIS — J8410 Pulmonary fibrosis, unspecified: Secondary | ICD-10-CM

## 2012-06-17 DIAGNOSIS — Z23 Encounter for immunization: Secondary | ICD-10-CM

## 2012-06-17 DIAGNOSIS — J439 Emphysema, unspecified: Secondary | ICD-10-CM

## 2012-06-17 DIAGNOSIS — E1159 Type 2 diabetes mellitus with other circulatory complications: Secondary | ICD-10-CM

## 2012-06-17 NOTE — Addendum Note (Signed)
Addended by: Nita Sells on: 06/17/2012 10:12 AM   Modules accepted: Orders

## 2012-06-17 NOTE — Assessment & Plan Note (Signed)
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

## 2012-06-17 NOTE — Assessment & Plan Note (Signed)
Golds COPD stage D. oxygen dependent  Plan Maintain albuterol by nebulization 3 times daily Maintain Advair Maintain oxygen This patient can be cleared from a pulmonary standpoint for planned stent endovascular aortic aneurysm surgery We will plan to see the patient in the hospital during the surgical procedure Note due to the fact the patient's on chronic prednisone this patient should be given hydrocortisone 50 mg IV piggyback prior to general anesthesia induction

## 2012-06-17 NOTE — Patient Instructions (Addendum)
You are cleared for vascular surgery No change in medications I will notify Dr Arbie Cookey of your clearance for surgery A chest xray will be ordered today Flu vaccine will be given today

## 2012-06-17 NOTE — Progress Notes (Signed)
Subjective:    Patient ID: [name], male    DOB: 06-06-1936, 76 y.o.   MRN: [id number]  HPI  76 y.o.WM  Hosp 7/10 -04/15/11 for CHF, resp failure, Ischemic CM 45-50%   COPD/ Pulm Fibrosis  D/c on oxygen.  CT neg for PE,  pulm infiltrates/fibrosis  01/14/2012 Dyspnea is same, feels better overall.  Is able to take bystolic daily, Losartan not taking every day. Still on oxygen  Pt denies any significant sore throat, nasal congestion or excess secretions, fever, chills, sweats, unintended weight loss, pleurtic or exertional chest pain, orthopnea PND, or leg swelling Pt denies any increase in rescue therapy over baseline, denies waking up needing it or having any early am or nocturnal exacerbations of coughing/wheezing/or dyspnea. Pt also denies any obvious fluctuation in symptoms with  weather or environmental change or other alleviating or aggravating factors  06/17/2012 Not seen since 12/2011.  F/u Copd, IPF, CHF.  Pt notes unchanged dyspnea.  Still on high flow oxygen.  Not on an oxygen conserving device.   Pt has AAA and needs stent graft. Pt denies edema in feet.  No real mucus.  No chest pain.  No real wheeze.  No abdominal pain noted.   Past Medical History  Diagnosis Date  . CAD (coronary artery disease)     s/p CABG; s/p redo CABG in 1997 and 2007; cath 4/10: LM occluded, CFX occluded, old S-RCA occluded, old S-RCA 90%, left radial to RCA ok, S-CFX ok, L-LAD ok, 2 old SVGs stumped  . Hypertension   . Hyperlipidemia   . Ischemic cardiomyopathy     echo 7/12: EF 45-50%, severe LVH, mild LAE; EF 50% per echo 2/13  . Chronic systolic heart failure     EF is 24% in the past; EF 45-50% in 7/12 and up to 50% in Feb 2013  . Carotid stenosis     doppler 11/11: 20-39% bilat.  . ICD (implantable cardiac defibrillator) in place 2010  . Hypertensive heart disease   . COPD (chronic obstructive pulmonary disease)   . History of stroke   . Hiatal hernia   . AAA (abdominal aortic  aneurysm)     followed by [facility]  . Pulmonary fibrosis     followed by [name]; on chronic oxygen.   . Prolonged grief reaction   . Diabetes mellitus, insulin dependent (IDDM), controlled   . CHF (congestive heart failure)      Family History  Problem Relation Age of Onset  . Heart attack Brother   . Coronary artery disease Brother   . Coronary artery disease Brother   . Heart disease Brother      History   Social History  . Marital Status: Married    Spouse Name: N/A    Number of Children: N/A  . Years of Education: N/A   Occupational History  . Not on file.   Social History Main Topics  . Smoking status: Former Smoker -- 1.0 packs/day for 47 years    Types: Cigarettes    Quit date: 03/12/2003  . Smokeless tobacco: Never Used  . Alcohol Use: No  . Drug Use: No  . Sexually Active: No   Other Topics Concern  . Not on file   Social History Narrative  . No narrative on file     Allergies  Allergen Reactions  . Sulfonamide Derivatives Nausea And Vomiting     Outpatient Prescriptions Prior to Visit  Medication Sig Dispense Refill  . albuterol (PROVENTIL) (  2.5 MG/3ML) 0.083% nebulizer solution Take 2.5 mg by nebulization. 2- 3 times daily      . aspirin 81 MG tablet Take 81 mg by mouth daily.        Marland Kitchen atorvastatin (LIPITOR) 40 MG tablet Take 1 tablet (40 mg total) by mouth daily.  30 tablet  11  . BYSTOLIC 2.5 MG tablet TAKE 1 TABLET ONCE DAILY.  30 tablet  12  . clopidogrel (PLAVIX) 75 MG tablet Take 1 tablet (75 mg total) by mouth daily.  30 tablet  6  . FLUoxetine (PROZAC) 40 MG capsule TAKE 1 CAPSULE DAILY.  30 capsule  3  . Fluticasone-Salmeterol (ADVAIR DISKUS) 250-50 MCG/DOSE AEPB Inhale 1 puff into the lungs 2 (two) times daily.  60 each  6  . furosemide (LASIX) 40 MG tablet Take 1 tablet (40 mg total) by mouth daily.  30 tablet  6  . insulin detemir (LEVEMIR) 100 UNIT/ML injection Inject 20 Units into the skin daily. And 23 units if blood sugar is >150       . Insulin Pen Needle (PEN NEEDLES 31GX5/16") 31G X 8 MM MISC       . losartan (COZAAR) 25 MG tablet Take 1 tablet (25 mg total) by mouth daily. If blood pressure is greater than 120 systolic  30 tablet  3  . multivitamin (THERAGRAN) per tablet Take 1 tablet by mouth daily.        . nateglinide (STARLIX) 60 MG tablet Take 60 mg by mouth daily.       . NON FORMULARY 5 liters 24/7      . pantoprazole (PROTONIX) 40 MG tablet Take 1 tablet (40 mg total) by mouth 2 (two) times daily.  60 tablet  5  . predniSONE (DELTASONE) 10 MG tablet Take 1 tablet (10 mg total) by mouth daily.  40 tablet  6  . tiotropium (SPIRIVA HANDIHALER) 18 MCG inhalation capsule Place 1 capsule (18 mcg total) into inhaler and inhale daily.  30 capsule  6  . ZETIA 10 MG tablet TAKE 1 TABLET ONCE DAILY.  30 tablet  6  . nebivolol (BYSTOLIC) 2.5 MG tablet Take 1 tablet (2.5 mg total) by mouth daily.          Review of Systems  Constitutional:   No  weight loss, night sweats,  Fevers, chills, fatigue, lassitude. HEENT:   No headaches,  Difficulty swallowing,  Tooth/dental problems,  Sore throat,                No sneezing, itching, ear ache, nasal congestion, post nasal drip,   CV:  No chest pain,  Orthopnea, PND, swelling in lower extremities, anasarca, dizziness, palpitations  GI  No heartburn, indigestion, abdominal pain, nausea, vomiting, diarrhea, change in bowel habits, loss of appetite  Resp: Notes  shortness of breath with exertion not  at rest.  No excess mucus, no productive cough,  No non-productive cough,  No coughing up of blood.  No change in color of mucus.  No wheezing.  No chest wall deformity  Skin: no rash or lesions.  GU: no dysuria, change in color of urine, no urgency or frequency.  No flank pain.  MS:  No joint pain or swelling.  No decreased range of motion.  No back pain.  Psych:  No change in mood or affect. No depression or anxiety.  No memory loss.     Objective:   Physical  Exam  Filed Vitals:   06/17/12 0936 06/17/12  0942  BP:  126/90  Pulse:  73  Temp:  97.6 F (36.4 C)  TempSrc:  Oral  Height:  5' 6.5" (1.689 m)  Weight:  187 lb (84.823 kg)  SpO2: 74% 92%    Gen: Pleasant, well-nourished, in no distress,  normal affect  ENT: No lesions,  mouth clear,  oropharynx clear, no postnasal drip  Neck: No JVD, no TMG, no carotid bruits  Lungs: No use of accessory muscles, no dullness to percussion, distant bs   Cardiovascular: RRR, heart sounds normal, no murmur or gallops, no peripheral edema  Abdomen: soft and NT, no HSM,  BS normal  Musculoskeletal: No deformities, no cyanosis or clubbing  Neuro: alert, non focal  Skin: Warm, no lesions or rashes        Assessment & Plan:   Pulmonary fibrosis, 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 oxygen dependent  Plan Maintain albuterol by nebulization 3 times daily Maintain Advair Maintain oxygen This patient can be cleared from a pulmonary standpoint for planned stent endovascular aortic aneurysm surgery We will plan to see the patient in the hospital during the surgical procedure Note due to the fact the patient's on chronic prednisone this patient should be given hydrocortisone 50 mg IV piggyback prior to general anesthesia induction    Note a flu vaccination was given on this visit 06/17/2012  Updated Medication List Outpatient Encounter Prescriptions as of 06/17/2012  Medication Sig Dispense Refill  . albuterol (PROVENTIL) (2.5 MG/3ML) 0.083% nebulizer solution Take 2.5 mg by nebulization. 2- 3 times daily      . aspirin 81 MG tablet Take 81 mg by mouth daily.        Marland Kitchen atorvastatin (LIPITOR) 40 MG tablet Take 1 tablet (40 mg total) by mouth daily.  30 tablet  11  .  BYSTOLIC 2.5 MG tablet TAKE 1 TABLET ONCE DAILY.  30 tablet  12  . clopidogrel (PLAVIX) 75 MG tablet Take 1 tablet (75 mg total) by mouth daily.  30 tablet  6  . FLUoxetine (PROZAC) 40 MG capsule TAKE 1 CAPSULE DAILY.  30 capsule  3  . Fluticasone-Salmeterol (ADVAIR DISKUS) 250-50 MCG/DOSE AEPB Inhale 1 puff into the lungs 2 (two) times daily.  60 each  6  . furosemide (LASIX) 40 MG tablet Take 1 tablet (40 mg total) by mouth daily.  30 tablet  6  . insulin detemir (LEVEMIR) 100 UNIT/ML injection Inject 20 Units into the skin daily. And 23 units if blood sugar is >150      . Insulin Pen Needle (PEN NEEDLES 31GX5/16") 31G X 8 MM MISC       . losartan (COZAAR) 25 MG tablet Take 1 tablet (25 mg total) by mouth daily. If blood pressure is greater than 120 systolic  30 tablet  3  . multivitamin (THERAGRAN) per tablet Take 1 tablet by mouth daily.        . nateglinide (STARLIX) 60 MG tablet Take 60 mg by mouth daily.       . NON FORMULARY 5 liters 24/7      . pantoprazole (PROTONIX) 40 MG tablet Take 1 tablet (40 mg total) by mouth 2 (two) times daily.  60 tablet  5  . predniSONE (DELTASONE) 10 MG tablet Take 1 tablet (10 mg total) by mouth  daily.  40 tablet  6  . tiotropium (SPIRIVA HANDIHALER) 18 MCG inhalation capsule Place 1 capsule (18 mcg total) into inhaler and inhale daily.  30 capsule  6  . ZETIA 10 MG tablet TAKE 1 TABLET ONCE DAILY.  30 tablet  6  . DISCONTD: nebivolol (BYSTOLIC) 2.5 MG tablet Take 1 tablet (2.5 mg total) by mouth daily.

## 2012-06-19 NOTE — Progress Notes (Signed)
Quick Note:  Called, spoke with pt. Informed him of cxr results and recs per [name]. He verbalized understanding of this. ______

## 2012-06-19 NOTE — Progress Notes (Signed)
Quick Note:  Notify the patient that the Xray is stable and no pneumonia, no heart failure. It is stable No change in medications are recommended. Continue current meds as prescribed at last office visit ______

## 2012-06-24 ENCOUNTER — Encounter: Payer: Self-pay | Admitting: *Deleted

## 2012-06-24 ENCOUNTER — Other Ambulatory Visit: Payer: Self-pay | Admitting: *Deleted

## 2012-07-04 ENCOUNTER — Encounter (HOSPITAL_COMMUNITY)
Admission: RE | Admit: 2012-07-04 | Discharge: 2012-07-04 | Disposition: A | Discharge status: Home / Self Care | Payer: Medicare Other | Source: Ambulatory Visit | Attending: Vascular Surgery | Admitting: Vascular Surgery

## 2012-07-04 ENCOUNTER — Encounter (HOSPITAL_COMMUNITY): Payer: Self-pay | Admitting: Pharmacy Technician

## 2012-07-04 ENCOUNTER — Encounter (HOSPITAL_COMMUNITY): Payer: Self-pay

## 2012-07-04 HISTORY — DX: Cerebral infarction, unspecified: I63.9

## 2012-07-04 HISTORY — DX: Acute myocardial infarction, unspecified: I21.9

## 2012-07-04 HISTORY — DX: Shortness of breath: R06.02

## 2012-07-04 LAB — SURGICAL PCR SCREEN
MRSA, PCR: NEGATIVE
Staphylococcus aureus: NEGATIVE

## 2012-07-04 LAB — URINALYSIS, ROUTINE W REFLEX MICROSCOPIC
Glucose, UA: NEGATIVE mg/dL
Hgb urine dipstick: NEGATIVE
Ketones, ur: NEGATIVE mg/dL
Protein, ur: 30 mg/dL — AB
Urobilinogen, UA: 1 mg/dL (ref 0.0–1.0)

## 2012-07-04 LAB — BLOOD GAS, ARTERIAL
Acid-Base Excess: 3.4 mmol/L — ABNORMAL HIGH (ref 0.0–2.0)
O2 Content: 5 L/min
Patient temperature: 98.6
TCO2: 29.1 mmol/L (ref 0–100)

## 2012-07-04 LAB — CBC
HCT: 43.1 % (ref 39.0–52.0)
MCH: 29.3 pg (ref 26.0–34.0)
MCHC: 32.9 g/dL (ref 30.0–36.0)
MCV: 89 fL (ref 78.0–100.0)
RDW: 15.8 % — ABNORMAL HIGH (ref 11.5–15.5)

## 2012-07-04 LAB — URINE MICROSCOPIC-ADD ON

## 2012-07-04 LAB — COMPREHENSIVE METABOLIC PANEL
ALT: 20 U/L (ref 0–53)
AST: 26 U/L (ref 0–37)
Alkaline Phosphatase: 30 U/L — ABNORMAL LOW (ref 39–117)
BUN: 23 mg/dL (ref 6–23)
GFR calc Af Amer: 53 mL/min — ABNORMAL LOW (ref >90)
GFR calc non Af Amer: 46 mL/min — ABNORMAL LOW (ref >90)

## 2012-07-04 LAB — APTT: aPTT: 32 seconds (ref 24–37)

## 2012-07-04 LAB — PROTIME-INR: INR: 0.97 (ref 0.00–1.49)

## 2012-07-04 NOTE — Progress Notes (Signed)
Notified M. Madilyn Fireman from [employer] [phone] of orders received and surgery date.

## 2012-07-04 NOTE — Pre-Procedure Instructions (Signed)
[name]  07/04/2012   Your procedure is scheduled on:  Wednesday October 16  Report to [facility] at 6:30 AM.  Call this number if you have problems the morning of surgery: [phone]   Remember:   Do not eat or drink:After Midnight.    Take these medicines the morning of surgery with A SIP OF WATER: Bystolic, Prozac, Protonix, Prednisone. Use inhlaers. Bring Albuterol inhaler to surgery.    Do not wear jewelry, make-up or nail polish.  Do not wear lotions, powders, or perfumes. You may wear deodorant.  Do not shave 48 hours prior to surgery. Men may shave face and neck.  Do not bring valuables to the hospital.  Contacts, dentures or bridgework may not be worn into surgery.  Leave suitcase in the car. After surgery it may be brought to your room.  For patients admitted to the hospital, checkout time is 11:00 AM the day of discharge.   Patients discharged the day of surgery will not be allowed to drive home.  Name and phone number of your driver: NA  Special Instructions: Shower using CHG 2 nights before surgery and the night before surgery.  If you shower the day of surgery use CHG.  Use special wash - you have one bottle of CHG for all showers.  You should use approximately 1/3 of the bottle for each shower.   Please read over the following fact sheets that you were given: Pain Booklet, Coughing and Deep Breathing, Blood Transfusion Information and Surgical Site Infection Prevention

## 2012-07-07 NOTE — Anesthesia Preprocedure Evaluation (Addendum)
Anesthesia Evaluation  Patient identified by MRN, date of birth, ID band Patient awake    Reviewed: Allergy & Precautions, H&P , NPO status , Patient's Chart, lab work & pertinent test results  Airway Mallampati: II  Neck ROM: full    Dental   Pulmonary shortness of breath, COPDformer smoker,          Cardiovascular hypertension, + CAD, + Past MI, + CABG and +CHF + pacemaker + Cardiac Defibrillator     Neuro/Psych Depression  Neuromuscular disease CVA    GI/Hepatic hiatal hernia,   Endo/Other  diabetes, Type 2  Renal/GU      Musculoskeletal   Abdominal   Peds  Hematology   Anesthesia Other Findings   Reproductive/Obstetrics                          Anesthesia Physical Anesthesia Plan  ASA: IV  Anesthesia Plan: General   Post-op Pain Management:    Induction: Intravenous  Airway Management Planned: Oral ETT  Additional Equipment: Arterial line  Intra-op Plan:   Post-operative Plan: Extubation in OR  Informed Consent: I have reviewed the patients History and Physical, chart, labs and discussed the procedure including the risks, benefits and alternatives for the proposed anesthesia with the patient or authorized representative who has indicated his/her understanding and acceptance.     Plan Discussed with: CRNA and Surgeon  Anesthesia Plan Comments: (See Anesthesia note for pulmonology and cardiology input.  Shonna Chock, PA-C)       Anesthesia Quick Evaluation

## 2012-07-07 NOTE — Consult Note (Addendum)
Anesthesia Chart Review:  Patient is a 76 year old male posted for EVAR of AAA on 07/16/12 by Dr. Arbie Cookey.  History includes former smoker, HTN, CAD s/p CABG '92, 97, and '07, chronic systolic CHF,  ischemic CM, [employer] ICD '10, COPD, CVA, hiatal hernia, pulmonary fibrosis (idiopathic UIP, steroid dependent) on home O2 4L at rest and 6L with exertion, COPD Golds stage D,DM2.  Pulmonologist is Dr. Shan Levans who cleared patient for this procedure from a pulmonary standpoint (see 06/17/12 note).  Due to the fact that patient is on chronic prednisone, he recommended that, "patient should be given hydrocortisone 50 mg IV piggyback prior to general anesthesia induction."   His Cardiologist is [name].  He saw Norma Fredrickson, NP on 06/10/12 for routine follow-up and preoperative evaluation.  According to her note, "I have talked with [name]. Antero does not have much in the way of options, either he will proceed or not proceed with stent grafting. His cardiac status seems stable at this time and he is not having active chest pain. He is not felt to need further testing from our standpoint. He may stop his Plavix for 5 days. He understands that due to his many health issues and especially his pulmonary fibrosis, that his risk is increased. He needs to remain on his current medicines including the low dose beta blocker. We will be available as needed for his hospital care."  EKG on 07/04/12 showed a-paced rhythm, cannot rule out inferior infarct (age undetermined), lateral T wave abnormality, consider ischemia.  Overall, I think his EKG is stable since at least 06/21/11.    Echo on 11/09/11 showed: - Left ventricle: The cavity size was normal. Wall thickness was normal. The estimated ejection fraction was 50%. Wall motion was normal; there were no regional wall motion abnormalities. Doppler parameters are consistent with abnormal left ventricular relaxation (grade 1 diastolic dysfunction). - Left atrium:  The atrium was moderately dilated. - Mild tricuspid regurgitation.  Cardiac cath on 01/07/09 showed: 1. Left ventricular dysfunction with inferobasilar aneurysm. EF 30%. 2. Patent radial artery graft to the distal right coronary artery and patent left internal mammary artery graft to the left anterior descending with a patent saphenous vein graft to the distal left circumflex with these grafts supplying retrograde fill to the left circumflex distribution and distal right coronary artery distribution. There is also a patent saphenous vein graft to the right coronary artery distally and this is a diseased graft, but appears to go to what would be primarily an infarcted portion of the myocardium.  3. Totally occluded native circulation.  He underwent insertion of a [employer] dual-chamber defibrillator on 01/31/09.  CXR on 06/17/12 showed: Status post CABG. Dual lead cardiac pacemaker is unchanged in position. Stable peripheral and basilar interstitial prominence and chronic fibrotic changes. No focal infiltrate or convincing pulmonary edema.  Labs noted. Cr 1.46 (stable).  If no significant change in his cardiopulmonary status then anticipate he can proceed as planned.  Shonna Chock, PA-C

## 2012-07-15 MED ORDER — DEXTROSE 5 % IV SOLN
1.5000 g | INTRAVENOUS | Status: AC
  Administered 2012-07-16: 1.5 g via INTRAVENOUS
  Filled 2012-07-15 (×2): qty 1.5

## 2012-07-16 ENCOUNTER — Ambulatory Visit (HOSPITAL_COMMUNITY): Payer: Medicare Other

## 2012-07-16 ENCOUNTER — Inpatient Hospital Stay (HOSPITAL_COMMUNITY)
Admission: RE | Admit: 2012-07-16 | Discharge: 2012-07-17 | DRG: 238 | Disposition: A | Discharge status: Home / Self Care | Payer: Medicare Other | Source: Ambulatory Visit | Attending: Vascular Surgery | Admitting: Vascular Surgery

## 2012-07-16 ENCOUNTER — Encounter (HOSPITAL_COMMUNITY)
Admission: RE | Disposition: A | Discharge status: Home / Self Care | Payer: Self-pay | Source: Ambulatory Visit | Attending: Vascular Surgery

## 2012-07-16 ENCOUNTER — Ambulatory Visit (HOSPITAL_COMMUNITY): Payer: Medicare Other | Admitting: Vascular Surgery

## 2012-07-16 ENCOUNTER — Encounter (HOSPITAL_COMMUNITY): Payer: Self-pay | Admitting: Vascular Surgery

## 2012-07-16 ENCOUNTER — Encounter (HOSPITAL_COMMUNITY): Payer: Self-pay | Admitting: *Deleted

## 2012-07-16 DIAGNOSIS — J4489 Other specified chronic obstructive pulmonary disease: Secondary | ICD-10-CM | POA: Diagnosis present

## 2012-07-16 DIAGNOSIS — I10 Essential (primary) hypertension: Secondary | ICD-10-CM | POA: Diagnosis present

## 2012-07-16 DIAGNOSIS — D62 Acute posthemorrhagic anemia: Secondary | ICD-10-CM

## 2012-07-16 DIAGNOSIS — Z9981 Dependence on supplemental oxygen: Secondary | ICD-10-CM

## 2012-07-16 DIAGNOSIS — I714 Abdominal aortic aneurysm, without rupture, unspecified: Principal | ICD-10-CM | POA: Diagnosis present

## 2012-07-16 DIAGNOSIS — I5032 Chronic diastolic (congestive) heart failure: Secondary | ICD-10-CM

## 2012-07-16 DIAGNOSIS — E119 Type 2 diabetes mellitus without complications: Secondary | ICD-10-CM | POA: Diagnosis present

## 2012-07-16 DIAGNOSIS — E785 Hyperlipidemia, unspecified: Secondary | ICD-10-CM | POA: Diagnosis present

## 2012-07-16 DIAGNOSIS — Z951 Presence of aortocoronary bypass graft: Secondary | ICD-10-CM

## 2012-07-16 DIAGNOSIS — I509 Heart failure, unspecified: Secondary | ICD-10-CM | POA: Diagnosis present

## 2012-07-16 DIAGNOSIS — Z794 Long term (current) use of insulin: Secondary | ICD-10-CM

## 2012-07-16 DIAGNOSIS — I2589 Other forms of chronic ischemic heart disease: Secondary | ICD-10-CM | POA: Diagnosis present

## 2012-07-16 DIAGNOSIS — J439 Emphysema, unspecified: Secondary | ICD-10-CM

## 2012-07-16 DIAGNOSIS — Z8673 Personal history of transient ischemic attack (TIA), and cerebral infarction without residual deficits: Secondary | ICD-10-CM

## 2012-07-16 DIAGNOSIS — J8410 Pulmonary fibrosis, unspecified: Secondary | ICD-10-CM | POA: Diagnosis present

## 2012-07-16 DIAGNOSIS — J449 Chronic obstructive pulmonary disease, unspecified: Secondary | ICD-10-CM | POA: Diagnosis present

## 2012-07-16 DIAGNOSIS — I2510 Atherosclerotic heart disease of native coronary artery without angina pectoris: Secondary | ICD-10-CM | POA: Diagnosis present

## 2012-07-16 DIAGNOSIS — Z87891 Personal history of nicotine dependence: Secondary | ICD-10-CM

## 2012-07-16 DIAGNOSIS — I5022 Chronic systolic (congestive) heart failure: Secondary | ICD-10-CM | POA: Diagnosis present

## 2012-07-16 HISTORY — PX: ABDOMINAL AORTIC ANEURYSM REPAIR: SUR1152

## 2012-07-16 LAB — GLUCOSE, CAPILLARY
Glucose-Capillary: 223 mg/dL — ABNORMAL HIGH (ref 70–99)
Glucose-Capillary: 139 mg/dL — ABNORMAL HIGH (ref 70–99)

## 2012-07-16 LAB — APTT: aPTT: >200 seconds (ref 24–37)

## 2012-07-16 LAB — CBC
HCT: 31 % — ABNORMAL LOW (ref 39.0–52.0)
MCH: 28.8 pg (ref 26.0–34.0)
MCHC: 31.9 g/dL (ref 30.0–36.0)
RDW: 16.4 % — ABNORMAL HIGH (ref 11.5–15.5)

## 2012-07-16 LAB — BASIC METABOLIC PANEL
BUN: 22 mg/dL (ref 6–23)
Calcium: 8.1 mg/dL — ABNORMAL LOW (ref 8.4–10.5)
Creatinine, Ser: 1.38 mg/dL — ABNORMAL HIGH (ref 0.50–1.35)
GFR calc Af Amer: 57 mL/min — ABNORMAL LOW (ref >90)
GFR calc non Af Amer: 49 mL/min — ABNORMAL LOW (ref >90)
Potassium: 4.3 mEq/L (ref 3.5–5.1)

## 2012-07-16 SURGERY — INSERTION, ENDOVASCULAR STENT GRAFT, AORTA, ABDOMINAL
Anesthesia: General | Site: Abdomen | Wound class: Clean

## 2012-07-16 MED ORDER — IODIXANOL 320 MG/ML IV SOLN
INTRAVENOUS | Status: DC | PRN
  Administered 2012-07-16: 150 mL via INTRAVENOUS
  Administered 2012-07-16: 150 mL via INTRA_ARTERIAL

## 2012-07-16 MED ORDER — ALBUTEROL SULFATE (5 MG/ML) 0.5% IN NEBU: 2.5000 mg | INHALATION_SOLUTION | Freq: Four times a day (QID) | RESPIRATORY_TRACT | Status: DC | PRN

## 2012-07-16 MED ORDER — ACETAMINOPHEN 650 MG RE SUPP: 325.0000 mg | RECTAL | Status: DC | PRN

## 2012-07-16 MED ORDER — PHENOL 1.4 % MT LIQD: 1.0000 | OROMUCOSAL | Status: DC | PRN

## 2012-07-16 MED ORDER — ACETAMINOPHEN 325 MG PO TABS: 325.0000 mg | ORAL_TABLET | ORAL | Status: DC | PRN

## 2012-07-16 MED ORDER — LOSARTAN POTASSIUM 25 MG PO TABS
25.0000 mg | ORAL_TABLET | Freq: Every day | ORAL | Status: DC
  Administered 2012-07-17: 25 mg via ORAL
  Filled 2012-07-16: qty 1

## 2012-07-16 MED ORDER — ARTIFICIAL TEARS OP OINT
TOPICAL_OINTMENT | OPHTHALMIC | Status: DC | PRN
  Administered 2012-07-16: 1 via OPHTHALMIC

## 2012-07-16 MED ORDER — CLOPIDOGREL BISULFATE 75 MG PO TABS
75.0000 mg | ORAL_TABLET | Freq: Every day | ORAL | Status: DC
  Administered 2012-07-17: 75 mg via ORAL
  Filled 2012-07-16 (×2): qty 1

## 2012-07-16 MED ORDER — DOPAMINE-DEXTROSE 3.2-5 MG/ML-% IV SOLN
2.0000 ug/kg/min | INTRAVENOUS | Status: DC | PRN
  Administered 2012-07-16: 15:00:00 via INTRAVENOUS

## 2012-07-16 MED ORDER — LABETALOL HCL 5 MG/ML IV SOLN: 10.0000 mg | INTRAVENOUS | Status: DC | PRN

## 2012-07-16 MED ORDER — SODIUM CHLORIDE 0.9 % IV SOLN
INTRAVENOUS | Status: DC
  Administered 2012-07-16 (×2): via INTRAVENOUS

## 2012-07-16 MED ORDER — GLYCOPYRROLATE 0.2 MG/ML IJ SOLN
INTRAMUSCULAR | Status: DC | PRN
  Administered 2012-07-16: .8 mg via INTRAVENOUS

## 2012-07-16 MED ORDER — ONDANSETRON HCL 4 MG/2ML IJ SOLN: 4.0000 mg | Freq: Four times a day (QID) | INTRAMUSCULAR | Status: DC | PRN

## 2012-07-16 MED ORDER — ATORVASTATIN CALCIUM 40 MG PO TABS
40.0000 mg | ORAL_TABLET | Freq: Every day | ORAL | Status: DC
  Administered 2012-07-17: 40 mg via ORAL
  Filled 2012-07-16: qty 1

## 2012-07-16 MED ORDER — DOCUSATE SODIUM 100 MG PO CAPS
100.0000 mg | ORAL_CAPSULE | Freq: Every day | ORAL | Status: DC
  Administered 2012-07-17: 100 mg via ORAL
  Filled 2012-07-16: qty 1

## 2012-07-16 MED ORDER — ALBUMIN HUMAN 5 % IV SOLN
INTRAVENOUS | Status: DC | PRN
  Administered 2012-07-16 (×2): via INTRAVENOUS

## 2012-07-16 MED ORDER — PHENYLEPHRINE HCL 10 MG/ML IJ SOLN
10.0000 mg | INTRAVENOUS | Status: DC | PRN
  Administered 2012-07-16: 5 ug/min via INTRAVENOUS

## 2012-07-16 MED ORDER — ONDANSETRON HCL 4 MG/2ML IJ SOLN
INTRAMUSCULAR | Status: DC | PRN
  Administered 2012-07-16: 4 mg via INTRAVENOUS

## 2012-07-16 MED ORDER — POTASSIUM CHLORIDE CRYS ER 20 MEQ PO TBCR: 20.0000 meq | EXTENDED_RELEASE_TABLET | Freq: Every day | ORAL | Status: DC | PRN

## 2012-07-16 MED ORDER — GUAIFENESIN-DM 100-10 MG/5ML PO SYRP: 15.0000 mL | ORAL_SOLUTION | ORAL | Status: DC | PRN

## 2012-07-16 MED ORDER — SODIUM CHLORIDE 0.9 % IV SOLN: INTRAVENOUS | Status: DC

## 2012-07-16 MED ORDER — SODIUM CHLORIDE 0.9 % IV SOLN
500.0000 mL | Freq: Once | INTRAVENOUS | Status: AC | PRN
  Administered 2012-07-16 (×2): 500 mL via INTRAVENOUS

## 2012-07-16 MED ORDER — OXYCODONE HCL 5 MG/5ML PO SOLN: 5.0000 mg | Freq: Once | ORAL | Status: DC | PRN

## 2012-07-16 MED ORDER — OXYCODONE HCL 5 MG PO TABS: 5.0000 mg | ORAL_TABLET | Freq: Once | ORAL | Status: DC | PRN

## 2012-07-16 MED ORDER — MAGNESIUM SULFATE 40 MG/ML IJ SOLN
2.0000 g | Freq: Every day | INTRAMUSCULAR | Status: DC | PRN
  Filled 2012-07-16: qty 50

## 2012-07-16 MED ORDER — HYDROMORPHONE HCL PF 1 MG/ML IJ SOLN: 0.2500 mg | INTRAMUSCULAR | Status: DC | PRN

## 2012-07-16 MED ORDER — PROPOFOL 10 MG/ML IV BOLUS
INTRAVENOUS | Status: DC | PRN
  Administered 2012-07-16: 120 mg via INTRAVENOUS
  Administered 2012-07-16: 80 mg via INTRAVENOUS

## 2012-07-16 MED ORDER — DOPAMINE-DEXTROSE 3.2-5 MG/ML-% IV SOLN: 2.0000 ug/kg/min | INTRAVENOUS | Status: DC | PRN

## 2012-07-16 MED ORDER — INSULIN ASPART 100 UNIT/ML ~~LOC~~ SOLN
0.0000 [IU] | Freq: Three times a day (TID) | SUBCUTANEOUS | Status: DC
  Administered 2012-07-17: 2 [IU] via SUBCUTANEOUS

## 2012-07-16 MED ORDER — PANTOPRAZOLE SODIUM 40 MG PO TBEC
40.0000 mg | DELAYED_RELEASE_TABLET | Freq: Two times a day (BID) | ORAL | Status: DC
  Administered 2012-07-16 – 2012-07-17 (×2): 40 mg via ORAL
  Filled 2012-07-16 (×2): qty 1

## 2012-07-16 MED ORDER — ASPIRIN 81 MG PO CHEW
81.0000 mg | CHEWABLE_TABLET | Freq: Every day | ORAL | Status: DC
  Administered 2012-07-16 – 2012-07-17 (×2): 81 mg via ORAL
  Filled 2012-07-16 (×2): qty 1

## 2012-07-16 MED ORDER — FLUTICASONE-SALMETEROL 250-50 MCG/DOSE IN AEPB
1.0000 | INHALATION_SPRAY | Freq: Two times a day (BID) | RESPIRATORY_TRACT | Status: DC
  Administered 2012-07-16 – 2012-07-17 (×2): 1 via RESPIRATORY_TRACT
  Filled 2012-07-16: qty 14

## 2012-07-16 MED ORDER — LIDOCAINE HCL (CARDIAC) 20 MG/ML IV SOLN
INTRAVENOUS | Status: DC | PRN
  Administered 2012-07-16: 80 mg via INTRAVENOUS

## 2012-07-16 MED ORDER — 0.9 % SODIUM CHLORIDE (POUR BTL) OPTIME
TOPICAL | Status: DC | PRN
  Administered 2012-07-16: 1000 mL

## 2012-07-16 MED ORDER — HYDRALAZINE HCL 20 MG/ML IJ SOLN: 10.0000 mg | INTRAMUSCULAR | Status: DC | PRN

## 2012-07-16 MED ORDER — METOPROLOL TARTRATE 1 MG/ML IV SOLN: 2.0000 mg | INTRAVENOUS | Status: DC | PRN

## 2012-07-16 MED ORDER — TIOTROPIUM BROMIDE MONOHYDRATE 18 MCG IN CAPS
18.0000 ug | ORAL_CAPSULE | Freq: Every day | RESPIRATORY_TRACT | Status: DC
  Administered 2012-07-17: 18 ug via RESPIRATORY_TRACT
  Filled 2012-07-16: qty 5

## 2012-07-16 MED ORDER — FLUOXETINE HCL 40 MG PO CAPS: 40.0000 mg | ORAL_CAPSULE | Freq: Every day | ORAL | Status: DC

## 2012-07-16 MED ORDER — HEPARIN SODIUM (PORCINE) 1000 UNIT/ML IJ SOLN
INTRAMUSCULAR | Status: DC | PRN
  Administered 2012-07-16: 2000 [IU] via INTRAVENOUS
  Administered 2012-07-16: 6000 [IU] via INTRAVENOUS
  Administered 2012-07-16: 2000 [IU] via INTRAVENOUS

## 2012-07-16 MED ORDER — OXYCODONE-ACETAMINOPHEN 5-325 MG PO TABS
1.0000 | ORAL_TABLET | ORAL | Status: DC | PRN
  Administered 2012-07-16 (×2): 2 via ORAL
  Filled 2012-07-16 (×2): qty 2

## 2012-07-16 MED ORDER — NEBIVOLOL HCL 2.5 MG PO TABS
2.5000 mg | ORAL_TABLET | Freq: Every day | ORAL | Status: DC
Start: 2012-07-17 — End: 2012-07-17
  Administered 2012-07-17: 2.5 mg via ORAL
  Filled 2012-07-16: qty 1

## 2012-07-16 MED ORDER — SODIUM CHLORIDE 0.9 % IR SOLN
Status: DC | PRN
  Administered 2012-07-16: 08:00:00

## 2012-07-16 MED ORDER — VECURONIUM BROMIDE 10 MG IV SOLR
INTRAVENOUS | Status: DC | PRN
  Administered 2012-07-16: 2 mg via INTRAVENOUS
  Administered 2012-07-16 (×2): 1 mg via INTRAVENOUS

## 2012-07-16 MED ORDER — DOPAMINE-DEXTROSE 3.2-5 MG/ML-% IV SOLN
INTRAVENOUS | Status: AC
  Filled 2012-07-16: qty 250

## 2012-07-16 MED ORDER — FUROSEMIDE 40 MG PO TABS
40.0000 mg | ORAL_TABLET | Freq: Every day | ORAL | Status: DC
  Administered 2012-07-17: 40 mg via ORAL
  Filled 2012-07-16: qty 1

## 2012-07-16 MED ORDER — LACTATED RINGERS IV SOLN
INTRAVENOUS | Status: DC | PRN
  Administered 2012-07-16 (×3): via INTRAVENOUS

## 2012-07-16 MED ORDER — FLUOXETINE HCL 20 MG PO CAPS
40.0000 mg | ORAL_CAPSULE | Freq: Every day | ORAL | Status: DC
  Administered 2012-07-16 – 2012-07-17 (×2): 40 mg via ORAL
  Filled 2012-07-16 (×2): qty 2

## 2012-07-16 MED ORDER — NEOSTIGMINE METHYLSULFATE 1 MG/ML IJ SOLN
INTRAMUSCULAR | Status: DC | PRN
  Administered 2012-07-16: 4 mg via INTRAVENOUS

## 2012-07-16 MED ORDER — DEXTROSE 5 % IV SOLN
1.5000 g | Freq: Two times a day (BID) | INTRAVENOUS | Status: AC
  Administered 2012-07-16 – 2012-07-17 (×2): 1.5 g via INTRAVENOUS
  Filled 2012-07-16 (×2): qty 1.5

## 2012-07-16 MED ORDER — PREDNISONE 10 MG PO TABS
10.0000 mg | ORAL_TABLET | Freq: Every day | ORAL | Status: DC
  Administered 2012-07-17: 10 mg via ORAL
  Filled 2012-07-16: qty 1

## 2012-07-16 MED ORDER — INSULIN DETEMIR 100 UNIT/ML ~~LOC~~ SOLN
20.0000 [IU] | Freq: Every day | SUBCUTANEOUS | Status: DC
  Administered 2012-07-17: 20 [IU] via SUBCUTANEOUS
  Filled 2012-07-16: qty 10

## 2012-07-16 MED ORDER — EZETIMIBE 10 MG PO TABS
10.0000 mg | ORAL_TABLET | Freq: Every day | ORAL | Status: DC
  Administered 2012-07-17: 10 mg via ORAL
  Filled 2012-07-16: qty 1

## 2012-07-16 MED ORDER — NATEGLINIDE 60 MG PO TABS
60.0000 mg | ORAL_TABLET | Freq: Every day | ORAL | Status: DC
Start: 2012-07-17 — End: 2012-07-17
  Administered 2012-07-17: 60 mg via ORAL
  Filled 2012-07-16: qty 1

## 2012-07-16 MED ORDER — ASPIRIN 81 MG PO TABS: 81.0000 mg | ORAL_TABLET | Freq: Every day | ORAL | Status: DC

## 2012-07-16 MED ORDER — ROCURONIUM BROMIDE 100 MG/10ML IV SOLN
INTRAVENOUS | Status: DC | PRN
  Administered 2012-07-16: 50 mg via INTRAVENOUS

## 2012-07-16 MED ORDER — MORPHINE SULFATE 2 MG/ML IJ SOLN: 2.0000 mg | INTRAMUSCULAR | Status: DC | PRN

## 2012-07-16 MED ORDER — FENTANYL CITRATE 0.05 MG/ML IJ SOLN
INTRAMUSCULAR | Status: DC | PRN
  Administered 2012-07-16: 50 ug via INTRAVENOUS
  Administered 2012-07-16: 100 ug via INTRAVENOUS
  Administered 2012-07-16 (×3): 50 ug via INTRAVENOUS

## 2012-07-16 MED ORDER — ONDANSETRON HCL 4 MG/2ML IJ SOLN
4.0000 mg | Freq: Four times a day (QID) | INTRAMUSCULAR | Status: DC | PRN
  Administered 2012-07-17: 4 mg via INTRAVENOUS
  Filled 2012-07-16: qty 2

## 2012-07-16 SURGICAL SUPPLY — 88 items
APL SKNCLS STERI-STRIP NONHPOA (GAUZE/BANDAGES/DRESSINGS) ×1
BAG BANDED W/RUBBER/TAPE 36X54 (MISCELLANEOUS) ×2 IMPLANT
BAG DECANTER FOR FLEXI CONT (MISCELLANEOUS) IMPLANT
BAG EQP BAND 135X91 W/RBR TAPE (MISCELLANEOUS) ×1
BAG SNAP BAND KOVER 36X36 (MISCELLANEOUS) ×2 IMPLANT
BALLN CODA OCL 2-9.0-35-120-3 (BALLOONS)
BALLOON COD OCL 2-9.0-35-120-3 (BALLOONS) IMPLANT
BENZOIN TINCTURE PRP APPL 2/3 (GAUZE/BANDAGES/DRESSINGS) ×2 IMPLANT
CANISTER SUCTION 2500CC (MISCELLANEOUS) ×2 IMPLANT
CATH BEACON 5 .035 65 VANSC3 (CATHETERS) ×1 IMPLANT
CATH BEACON 5.038 65CM KMP-01 (CATHETERS) ×1 IMPLANT
CATH HEADHUNTER 5FR 65CM (MISCELLANEOUS) ×1 IMPLANT
CATH OMNI FLUSH .035X70CM (CATHETERS) ×1 IMPLANT
CLIP LIGATING EXTRA MED SLVR (CLIP) ×1 IMPLANT
CLIP LIGATING EXTRA SM BLUE (MISCELLANEOUS) ×1 IMPLANT
CLOTH BEACON ORANGE TIMEOUT ST (SAFETY) ×2 IMPLANT
COVER DOME SNAP 22 D (MISCELLANEOUS) ×3 IMPLANT
COVER MAYO STAND STRL (DRAPES) ×2 IMPLANT
COVER PROBE W GEL 5X96 (DRAPES) ×2 IMPLANT
COVER SURGICAL LIGHT HANDLE (MISCELLANEOUS) ×2 IMPLANT
DEVICE CLOSURE PERCLS PRGLD 6F (VASCULAR PRODUCTS) IMPLANT
DEVICE TORQUE H2O (MISCELLANEOUS) ×1 IMPLANT
DRAIN CHANNEL 10F 3/8 F FF (DRAIN) IMPLANT
DRAIN CHANNEL 10M FLAT 3/4 FLT (DRAIN) IMPLANT
DRAPE TABLE COVER HEAVY DUTY (DRAPES) ×2 IMPLANT
DRESSING OPSITE X SMALL 2X3 (GAUZE/BANDAGES/DRESSINGS) ×2 IMPLANT
DRYSEAL FLEXSHEATH 12FR 33CM (SHEATH) ×1
DRYSEAL FLEXSHEATH 18FR 33CM (SHEATH) ×1
ELECT CAUTERY BLADE 6.4 (BLADE) ×1 IMPLANT
ELECT REM PT RETURN 9FT ADLT (ELECTROSURGICAL) ×4
ELECTRODE REM PT RTRN 9FT ADLT (ELECTROSURGICAL) ×2 IMPLANT
EVACUATOR 3/16  PVC DRAIN (DRAIN)
EVACUATOR 3/16 PVC DRAIN (DRAIN) IMPLANT
EVACUATOR SILICONE 100CC (DRAIN) IMPLANT
GLOVE BIO SURGEON STRL SZ 6.5 (GLOVE) ×1 IMPLANT
GLOVE BIOGEL PI IND STRL 7.0 (GLOVE) IMPLANT
GLOVE BIOGEL PI INDICATOR 7.0 (GLOVE) ×4
GLOVE ECLIPSE 7.0 STRL STRAW (GLOVE) ×1 IMPLANT
GLOVE SS BIOGEL STRL SZ 7.5 (GLOVE) ×1 IMPLANT
GLOVE SUPERSENSE BIOGEL SZ 7.5 (GLOVE) ×1
GLOVE SURG SS PI 7.0 STRL IVOR (GLOVE) ×2 IMPLANT
GOWN STRL NON-REIN LRG LVL3 (GOWN DISPOSABLE) ×7 IMPLANT
GOWN STRL REIN XL XLG (GOWN DISPOSABLE) ×2 IMPLANT
GRAFT BALLN CATH 65CM (STENTS) IMPLANT
GRAFT ENDOPROSETHESIS 26/14/12 (Endovascular Graft) ×1 IMPLANT
GRAFT EXCLUD AORTIC (Endovascular Graft) ×1 IMPLANT
GUIDEWIRE ANGLED .035X150CM (WIRE) ×1 IMPLANT
KIT BASIN OR (CUSTOM PROCEDURE TRAY) ×2 IMPLANT
KIT ROOM TURNOVER OR (KITS) ×2 IMPLANT
LEG CONTRALATERAL 16X16X13.5 (Endovascular Graft) ×2 IMPLANT
LEG CONTRALATERAL 16X18X9.5 (Endovascular Graft) ×1 IMPLANT
NDL PERC 18GX7CM (NEEDLE) ×1 IMPLANT
NEEDLE PERC 18GX7CM (NEEDLE) ×2 IMPLANT
NS IRRIG 1000ML POUR BTL (IV SOLUTION) ×3 IMPLANT
PACK AORTA (CUSTOM PROCEDURE TRAY) ×2 IMPLANT
PAD ARMBOARD 7.5X6 YLW CONV (MISCELLANEOUS) ×4 IMPLANT
PENCIL BUTTON HOLSTER BLD 10FT (ELECTRODE) ×1 IMPLANT
PERCLOSE PROGLIDE 6F (VASCULAR PRODUCTS) ×10
PROTECTION STATION PRESSURIZED (MISCELLANEOUS) ×2
SHEATH AVANTI 11CM 8FR (MISCELLANEOUS) ×1 IMPLANT
SHEATH BRITE TIP 8FR 23CM (MISCELLANEOUS) ×1 IMPLANT
SHEATH DRYSEAL FLEX 12FR 33CM (SHEATH) IMPLANT
SHEATH DRYSEAL FLEX 18FR 33CM (SHEATH) IMPLANT
STAPLER VISISTAT 35W (STAPLE) IMPLANT
STATION PROTECTION PRESSURIZED (MISCELLANEOUS) IMPLANT
STENT GRAFT BALLN CATH 65CM (STENTS) ×1
STENT GRAFT CONTRALAT 16X13.5 (Endovascular Graft) IMPLANT
STOPCOCK MORSE 400PSI 3WAY (MISCELLANEOUS) ×2 IMPLANT
STRIP CLOSURE SKIN 1/2X4 (GAUZE/BANDAGES/DRESSINGS) ×2 IMPLANT
SUT ETHILON 3 0 PS 1 (SUTURE) IMPLANT
SUT PROLENE 5 0 C 1 24 (SUTURE) IMPLANT
SUT VIC AB 2-0 CTX 36 (SUTURE) ×2 IMPLANT
SUT VIC AB 3-0 SH 27 (SUTURE)
SUT VIC AB 3-0 SH 27X BRD (SUTURE) ×2 IMPLANT
SUT VIC AB 4-0 PS2 27 (SUTURE) ×2 IMPLANT
SYR 20CC LL (SYRINGE) ×4 IMPLANT
SYR 30ML LL (SYRINGE) ×1 IMPLANT
SYR 5ML LL (SYRINGE) ×2 IMPLANT
SYR MEDRAD MARK V 150ML (SYRINGE) ×2 IMPLANT
SYRINGE 10CC LL (SYRINGE) ×6 IMPLANT
TOWEL OR 17X24 6PK STRL BLUE (TOWEL DISPOSABLE) ×4 IMPLANT
TOWEL OR 17X26 10 PK STRL BLUE (TOWEL DISPOSABLE) ×4 IMPLANT
TRAY FOLEY CATH 14FRSI W/METER (CATHETERS) ×2 IMPLANT
TUBING HIGH PRESSURE 120CM (CONNECTOR) ×2 IMPLANT
VANSCHIE5 ×1 IMPLANT
WATER STERILE IRR 1000ML POUR (IV SOLUTION) ×2 IMPLANT
WIRE AMPLATZ SS-J .035X180CM (WIRE) ×3 IMPLANT
WIRE BENTSON .035X145CM (WIRE) ×2 IMPLANT

## 2012-07-16 NOTE — Anesthesia Postprocedure Evaluation (Signed)
Anesthesia Post Note  Patient: [name]  Procedure(s) Performed: Procedure(s) (LRB): ABDOMINAL AORTIC ENDOVASCULAR STENT GRAFT (N/A)  Anesthesia type: General  Patient location: PACU  Post pain: Pain level controlled and Adequate analgesia  Post assessment: Post-op Vital signs reviewed, Patient's Cardiovascular Status Stable, Respiratory Function Stable, Patent Airway and Pain level controlled  Last Vitals:  Filed Vitals:   07/16/12 1353  BP: 81/56  Pulse: 75  Temp:   Resp: 10    Post vital signs: Reviewed and stable  Level of consciousness: awake, alert  and oriented  Complications: No apparent anesthesia complications

## 2012-07-16 NOTE — Anesthesia Procedure Notes (Signed)
Procedure Name: Intubation Date/Time: 07/16/2012 8:44 AM Performed by: Rossie Muskrat L Pre-anesthesia Checklist: Patient identified, Timeout performed, Emergency Drugs available, Suction available and Patient being monitored Patient Re-evaluated:Patient Re-evaluated prior to inductionOxygen Delivery Method: Circle system utilized Preoxygenation: Pre-oxygenation with 100% oxygen Intubation Type: IV induction Ventilation: Mask ventilation without difficulty Laryngoscope Size: Miller and 2 Tube type: Oral Tube size: 7.5 mm Number of attempts: 1 Airway Equipment and Method: Stylet Placement Confirmation: ETT inserted through vocal cords under direct vision,  breath sounds checked- equal and bilateral and positive ETCO2 Secured at: 23 cm Tube secured with: Tape Dental Injury: Teeth and Oropharynx as per pre-operative assessment

## 2012-07-16 NOTE — Transfer of Care (Signed)
Immediate Anesthesia Transfer of Care Note  Patient: [name]  Procedure(s) Performed: Procedure(s) (LRB) with comments: ABDOMINAL AORTIC ENDOVASCULAR STENT GRAFT (N/A) - endovascular abdominal aortic stent grafting  Patient Location: PACU  Anesthesia Type: General  Level of Consciousness: awake, alert  and oriented  Airway & Oxygen Therapy: Patient Spontanous Breathing and Patient connected to face mask oxygen  Post-op Assessment: Report given to PACU RN, Post -op Vital signs reviewed and stable and Patient moving all extremities  Post vital signs: Reviewed and stable  Complications: No apparent anesthesia complications

## 2012-07-16 NOTE — H&P (Signed)
[name]   05/20/2012 3:15 PM Office Visit  MRN: [id number]   Description: 76 year old male  Provider: Audel Coakley, MD  Department: [facility]        Diagnoses     Abdominal aneurysm without mention of rupture   - Primary    441.4      Reason for Visit     Follow-up    CT abdomen /pelvis  05-20-2012    AAA        Vitals - Last Recorded       BP Pulse Resp Ht Wt BMI    132/87 87 18 5\' 6"  (1.676 m) 183 lb (83.008 kg) 29.54 kg/m2       Progress Notes     Richard Arnall, MD  05/20/2012  5:56 PM  Signed Patient has today for discussion regarding his abdominal aortic aneurysm. We've seen in the each ago with duplex suggesting increase in size and is here today with CT scan followup. His physical exam and past history is unchanged from his 05/06/2012 evaluation. CT scan today shows a maximal diameter of his aorta aneurysm of 6 cm. He does have some angulation of his infrarenal aortic neck and does appear to have adequate length for stent grafting. He does have ectasia in his iliac arteries bilaterally but again appears to be a good candidate for stent grafting with good arch vessels.    Past Medical History   Diagnosis  Date   .  CAD (coronary artery disease)         s/p CABG; s/p redo CABG in 1997 and 2007; cath 4/10: LM occluded, CFX occluded, old S-RCA occluded, old S-RCA 90%, left radial to RCA ok, S-CFX ok, L-LAD ok, 2 old SVGs stumped   .  Hypertension     .  Hyperlipidemia     .  Ischemic cardiomyopathy         echo 7/12: EF 45-50%, severe LVH, mild LAE; EF 50% per echo 2/13   .  Chronic systolic heart failure         EF is 24% in the past; EF 45-50% in 7/12 and up to 50% in Feb 2013   .  Carotid stenosis         doppler 11/11: 20-39% bilat.   .  ICD (implantable cardiac defibrillator) in place  2010   .  Hypertensive heart disease     .  COPD (chronic obstructive pulmonary disease)     .  History of stroke     .  Hiatal hernia     .  AAA (abdominal aortic  aneurysm)         followed by [facility]   .  Pulmonary fibrosis         followed by [name]; on chronic oxygen.    .  Prolonged grief reaction     .  Diabetes mellitus, insulin dependent (IDDM), controlled     .  CHF (congestive heart failure)         History   Substance Use Topics   .  Smoking status:  Former Smoker -- 1.0 packs/day for 47 years       Types:  Cigarettes       Quit date:  03/12/2003   .  Smokeless tobacco:  Never Used   .  Alcohol Use:  No       Family History   Problem  Relation  Age of Onset   .  Heart attack  Brother     .  Coronary artery disease  Brother     .  Coronary artery disease  Brother     .  Heart disease  Brother         Allergies   Allergen  Reactions   .  Sulfonamide Derivatives  Nausea And Vomiting      Current outpatient prescriptions:albuterol (PROVENTIL) (2.5 MG/3ML) 0.083% nebulizer solution, Take 2.5 mg by nebulization. 2- 3 times daily, Disp: , Rfl: ;  aspirin 81 MG tablet, Take 81 mg by mouth daily.  , Disp: , Rfl: ;  atorvastatin (LIPITOR) 40 MG tablet, Take 1 tablet (40 mg total) by mouth daily., Disp: 30 tablet, Rfl: 11;  BYSTOLIC 2.5 MG tablet, TAKE 1 TABLET ONCE DAILY., Disp: 30 tablet, Rfl: 12 clopidogrel (PLAVIX) 75 MG tablet, Take 1 tablet (75 mg total) by mouth daily., Disp: 30 tablet, Rfl: 6;  FLUoxetine (PROZAC) 40 MG capsule, TAKE 1 CAPSULE DAILY., Disp: 30 capsule, Rfl: 3;  Fluticasone-Salmeterol (ADVAIR DISKUS) 250-50 MCG/DOSE AEPB, Inhale 1 puff into the lungs 2 (two) times daily., Disp: 60 each, Rfl: 6;  furosemide (LASIX) 40 MG tablet, Take 1 tablet (40 mg total) by mouth daily., Disp: 30 tablet, Rfl: 6 insulin detemir (LEVEMIR) 100 UNIT/ML injection, Inject 20 Units into the skin daily., Disp: , Rfl: ;  losartan (COZAAR) 25 MG tablet, Take 1 tablet (25 mg total) by mouth daily. If blood pressure is greater than 120 systolic, Disp: 30 tablet, Rfl: 3;  multivitamin (THERAGRAN) per tablet, Take 1 tablet by mouth daily.  , Disp: ,  Rfl: ;  nebivolol (BYSTOLIC) 2.5 MG tablet, Take 1 tablet (2.5 mg total) by mouth daily., Disp: , Rfl:   NON FORMULARY, USE 4 LITERS OF O2 WHEN RESTING; HOWEVER USE 6 LITERS OF O2 ONLY WHEN O2 IS 85% OR LOWER WITH ACTIVITY. , Disp: , Rfl: ;  pantoprazole (PROTONIX) 40 MG tablet, Take 1 tablet (40 mg total) by mouth 2 (two) times daily., Disp: 60 tablet, Rfl: 5;  predniSONE (DELTASONE) 10 MG tablet, Take 1 tablet (10 mg total) by mouth daily., Disp: 40 tablet, Rfl: 6 tiotropium (SPIRIVA HANDIHALER) 18 MCG inhalation capsule, Place 1 capsule (18 mcg total) into inhaler and inhale daily., Disp: 30 capsule, Rfl: 6;  ZETIA 10 MG tablet, TAKE 1 TABLET ONCE DAILY., Disp: 30 tablet, Rfl: 6 No current facility-administered medications for this visit. Facility-Administered Medications Ordered in Other Visits: iohexol (OMNIPAQUE) 350 MG/ML injection 80 mL, 80 mL, Intravenous, Once PRN, Juline Patch, MD, 80 mL at 05/20/12 1403   BP 132/87  Pulse 87  Resp 18  Ht 5\' 6"  (1.676 m)  Wt 183 lb (83.008 kg)  BMI 29.54 kg/m2   Body mass index is 29.54 kg/(m^2).             Physical exam unchanged. Alert oriented gentleman with continuous oxygen therapy. Abdomen is obese but nontender. He does have 2+ femoral pulses.   Impression and plan 6.0 cm infrarenal abdominal aortic aneurysm probably accessible by stent grafting. I explained that we will do a formal lyses measurements to determine the prep for treatment. He is to see cardiology for further evaluation with a prior scheduled appointment on September 9. Assuming he is cleared for stent graft and we will proceed with the procedure and otherwise will await for cardiac clearance.     Electronic signature on 05/20/2012      Addendum:  The patient has been re-examined and re-evaluated.  The patient's history and physical has been reviewed and is unchanged.    [name] is a 76 y.o. male is being admitted with AAA. All the risks, benefits and  other treatment options have been discussed with the patient. The patient has consented to proceed with Procedure(s): ABDOMINAL AORTIC ENDOVASCULAR STENT GRAFT as a surgical intervention.  [name] 07/16/2012 7:51 AM Vascular [facility] Surgery

## 2012-07-16 NOTE — Op Note (Signed)
OPERATIVE REPORT  DATE OF SURGERY: 07/16/2012  PATIENT: [name], 76 y.o. male MRN: [id number]  DOB: [birth year]  PRE-OPERATIVE DIAGNOSIS: Infrarenal abdominal aortic aneurysm  POST-OPERATIVE DIAGNOSIS:  Same  PROCEDURE: Gore stent graft repair infrarenal abdominal aortic aneurysm  SURGEON:  Gretta Began, M.D.  PHYSICIAN ASSISTANT: Roczniak  ANESTHESIA:  Gen.  EBL: 100 ml  Total I/O In: 2500 [I.V.:2000; IV Piggyback:500] Out: 200 [Urine:200]  BLOOD ADMINISTERED: None  DRAINS: None  SPECIMEN: None  COUNTS CORRECT:  YES  PLAN OF CARE: PACU   PATIENT DISPOSITION:  PACU - hemodynamically stable  PROCEDURE DETAILS: The patient was taken to the operating room placed supine position where the area of both groins and the abdomen were prepped and draped in the usual sterile fashion. Using SonoSite ultrasound the proximal common femoral arteries were cannulated with 18-gauge needles bilaterally and a guidewire was passed centrally. 2 pro glide percutaneous closure devices were placed at 1:00 and 11:00 position and partially deployed. These were secured with stay for eventual closure at the end of the case. 8 French sheath were placed in each artery. A comfy catheter was used to position the J-wire in the suprarenal aorta bilaterally. A. 18 French sheath was positioned over an Amplatz superstiff wire with the proximal portion being at the level of the renal arteries. A marker Omni Flush catheter was positioned above the level renal arteries. The main body of the device which was a 26 x 14 x 12 cm device was positioned at the level of renal arteries. Injection showed the level of the renal arteries and the main body was deployed in its proximal portion. The limbs weren't crossed disorientation. The injection showed good positioning below the level of the renal arteries.  The Omni Flush catheter was then drawn down into the aortic sac. The [facility] wire was passed through this to try to  cannulate the contralateral gate. This orientation was not appropriate so a comfy catheter was used. There was difficulty in cannulating the gate from this angle and so therefore an angled Glidewire was used. Additional positional catheters were attempted and were unsuccessful in cannulating the contralateral gate. For this reason the main body of the device was partially reconstructing and rotated with the contralateral gate the more anterior position. The gate could still not be cannulated and therefore the main body device was pulled more distally down into the aneurysm sac with a ventral cannulization. The devices and positioned back at the level of the renals and reimaging with contrast was taken to show the level renal system the main proximal portion of the main body was redeployed. A marker Omni Flush catheter was placed back over the Amplatz wire that was going from the right groin into the contralateral gate and a retrograde injection through the sheath showed the area of the hypogastric artery takeoff. The 16 x 13.5 cm contralateral limb was chosen and this was positioned to the distal portion just above the hypogastric takeoff. This was deployed. Finally the left lateral extender was placed over the Amplatz wire into the main body of the device. Again a retrograde injection through the left femoral sheath showed the level of hypogastric artery and the 18 x 9.5 cm cut to iliac extender was placed on the left. The proximal and distal insertion junction sites and all junction between the grafts were gently dilated with balloon. The pigtail catheter was repositioned above the level renal arteries and completion arteriogram was obtained. This did show what appeared to be  some blushing of contrast around the right lateral portion of the aorta and some relatively Carissa Musick filling of the aneurysm sac. This reason decision was made to place a proximal aortic standard. There were several millimeters below the level  of the right renal with the tortuosity of the proximal aorta. A 26 x 3.3 cm aortic extender was positioned over an Amplatz wire and was positioned slightly higher than the upper portion of the device. This was deployed and readable and ballooned. The repeat arteriogram to the pigtail catheter showed no evidence of flow around the graft. There was some flow in the aneurysm sac it was a potential type II endoleak with lumbars. No further endovascular options were available. The right and then left groin sheaths were removed and the pro glide hypertension closure devices were deployed with satisfactory hemostasis. The bone incisions in the groin were closed with 4 subpectoral Vicryl stitch. Sterile dressing was applied. The patient had biphasic Doppler signals and well perfused feet bilaterally. He was transferred to the recovery room in stable condition the patient had been given initially 6000 units per cc heparin prior to placement of a large sheaths and had re\re dosing of 2000 heparin 2 separate occasions. He did not have protamine for reversal.   Gretta Began, M.D. 07/16/2012 1:15 PM

## 2012-07-16 NOTE — Preoperative (Signed)
Beta Blockers   Reason not to administer Beta Blockers:B blocker taken 07/15/12 @ 1900

## 2012-07-16 NOTE — Progress Notes (Signed)
Spoke with Leta Jungling ([employer] Rep) aware that pt. Having surgery time.

## 2012-07-17 ENCOUNTER — Other Ambulatory Visit: Payer: Self-pay | Admitting: *Deleted

## 2012-07-17 DIAGNOSIS — J8410 Pulmonary fibrosis, unspecified: Secondary | ICD-10-CM

## 2012-07-17 DIAGNOSIS — Z48812 Encounter for surgical aftercare following surgery on the circulatory system: Secondary | ICD-10-CM

## 2012-07-17 DIAGNOSIS — I5032 Chronic diastolic (congestive) heart failure: Secondary | ICD-10-CM

## 2012-07-17 DIAGNOSIS — J438 Other emphysema: Secondary | ICD-10-CM

## 2012-07-17 DIAGNOSIS — D62 Acute posthemorrhagic anemia: Secondary | ICD-10-CM

## 2012-07-17 DIAGNOSIS — I714 Abdominal aortic aneurysm, without rupture: Secondary | ICD-10-CM

## 2012-07-17 LAB — PREPARE RBC (CROSSMATCH)

## 2012-07-17 LAB — GLUCOSE, CAPILLARY
Glucose-Capillary: 130 mg/dL — ABNORMAL HIGH (ref 70–99)
Glucose-Capillary: 134 mg/dL — ABNORMAL HIGH (ref 70–99)

## 2012-07-17 LAB — BASIC METABOLIC PANEL
CO2: 25 mEq/L (ref 19–32)
Calcium: 8.2 mg/dL — ABNORMAL LOW (ref 8.4–10.5)
Chloride: 106 mEq/L (ref 96–112)
Creatinine, Ser: 1.3 mg/dL (ref 0.50–1.35)
Glucose, Bld: 123 mg/dL — ABNORMAL HIGH (ref 70–99)

## 2012-07-17 LAB — CBC
HCT: 22.7 % — ABNORMAL LOW (ref 39.0–52.0)
Hemoglobin: 7.7 g/dL — ABNORMAL LOW (ref 13.0–17.0)
MCH: 30.8 pg (ref 26.0–34.0)
MCV: 90.8 fL (ref 78.0–100.0)
RBC: 2.5 MIL/uL — ABNORMAL LOW (ref 4.22–5.81)

## 2012-07-17 MED ORDER — OXYCODONE-ACETAMINOPHEN 5-325 MG PO TABS: 1.0000 | ORAL_TABLET | ORAL | Status: DC | PRN

## 2012-07-17 MED ORDER — FUROSEMIDE 10 MG/ML IJ SOLN
40.0000 mg | Freq: Once | INTRAMUSCULAR | Status: AC
  Administered 2012-07-17: 40 mg via INTRAVENOUS
  Filled 2012-07-17: qty 4

## 2012-07-17 NOTE — Progress Notes (Signed)
RT called Dr Delford Field to clarify order for high flow nasal cannula/oximizer.  Our facility does not have either of these devices.  [name] wanted patient to be on 6L at rest and 50% VM during any activity until he is discharged. Patient uses high flow nasal cannula at home.

## 2012-07-17 NOTE — Consult Note (Signed)
Name: [name] MRN: [id number] DOB: [birth year]    LOS: 1  Referring Provider:  Early Reason for Referral:  hypoxemia  PULMONARY / CRITICAL CARE MEDICINE  HPI:  76 y.o. WM with IPF, CM EF 50% 2/13.  HTN HL Copd, s/p AAA endovasc stent 10/16 Pt still hypoxic post op.  He denies chest pain, mucus, cough, fever, chills, orthopnea or other cardiopulm c/o. Pt desats to 70% with ambulation on 5-6L     Past Medical History  Diagnosis Date  . CAD (coronary artery disease)     s/p CABG; s/p redo CABG in 1997 and 2007; cath 4/10: LM occluded, CFX occluded, old S-RCA occluded, old S-RCA 90%, left radial to RCA ok, S-CFX ok, L-LAD ok, 2 old SVGs stumped  . Hypertension   . Hyperlipidemia   . Ischemic cardiomyopathy     echo 7/12: EF 45-50%, severe LVH, mild LAE; EF 50% per echo 2/13  . Chronic systolic heart failure     EF is 24% in the past; EF 45-50% in 7/12 and up to 50% in Feb 2013  . Carotid stenosis     doppler 11/11: 20-39% bilat.  . ICD (implantable cardiac defibrillator) in place 2010  . Hypertensive heart disease   . COPD (chronic obstructive pulmonary disease)   . History of stroke   . Hiatal hernia   . AAA (abdominal aortic aneurysm)     followed by [facility]  . Pulmonary fibrosis     followed by [name]; on chronic oxygen.   . Prolonged grief reaction   . Diabetes mellitus, insulin dependent (IDDM), controlled   . CHF (congestive heart failure)   . Myocardial infarction   . Stroke   . Shortness of breath     on home O2   Past Surgical History  Procedure Date  . Coronary artery bypass graft 2007    redo CABG x2 -- at that time had left radial artery graft to the distal right coronary and saphenous vein graft to the distal circumflex.   . Coronary artery bypass graft 1997    redo surgery x2 --  in January 18, 1996, and at that time had a vein graft to the diagonal and OM and a vein graft to the posterior descending and  posterolateral branches   . Coronary artery  bypass graft 1992    x2 -- left internal mammary to the LAD, a single vein graft to the diagonal - obtuse marginal system  and posterior descending  . Insert / replace / remove pacemaker 01/29/2009    [employer] Virtuoso II DR four chamber AICD  . Cardiac catheterization 01/07/2009     performed by Dr. Deborah Chalk on January 07, 2009 revealing medically manageable coronary artery disease with reduced EF at 30%   . Hernia repair 1998  . Prostate test June 2013  . Exploration post operative open heart 2007   Prior to Admission medications   Medication Sig Start Date End Date Taking? Authorizing Provider  albuterol (PROVENTIL) (2.5 MG/3ML) 0.083% nebulizer solution Take 2.5 mg by nebulization 3 (three) times daily. 2- 3 times daily   Yes Historical Provider, MD  aspirin 81 MG tablet Take 81 mg by mouth daily.     Yes Historical Provider, MD  atorvastatin (LIPITOR) 40 MG tablet Take 1 tablet (40 mg total) by mouth daily. 04/22/12  Yes Rosalio Macadamia, NP  clopidogrel (PLAVIX) 75 MG tablet Take 1 tablet (75 mg total) by mouth daily. 01/17/12  Yes Jennet Maduro  Tyrone Sage, NP  ezetimibe (ZETIA) 10 MG tablet Take 10 mg by mouth daily.   Yes Historical Provider, MD  FLUoxetine (PROZAC) 40 MG capsule Take 40 mg by mouth daily.   Yes Historical Provider, MD  Fluticasone-Salmeterol (ADVAIR DISKUS) 250-50 MCG/DOSE AEPB Inhale 1 puff into the lungs 2 (two) times daily. 10/30/11  Yes Storm Frisk, MD  furosemide (LASIX) 40 MG tablet Take 1 tablet (40 mg total) by mouth daily. 04/22/12  Yes Rosalio Macadamia, NP  insulin detemir (LEVEMIR) 100 UNIT/ML injection Inject 20 Units into the skin daily. And 23 units if blood sugar is >150   Yes Historical Provider, MD  Insulin Pen Needle (PEN NEEDLES 31GX5/16") 31G X 8 MM MISC  05/29/12  Yes Historical Provider, MD  losartan (COZAAR) 25 MG tablet Take 1 tablet (25 mg total) by mouth daily. If blood pressure is greater than 120 systolic 04/22/12  Yes Rosalio Macadamia, NP  Multiple Vitamin  (MULTIVITAMIN WITH MINERALS) TABS Take 1 tablet by mouth daily.   Yes Historical Provider, MD  nateglinide (STARLIX) 60 MG tablet Take 60 mg by mouth daily.  04/09/12  Yes Historical Provider, MD  nebivolol (BYSTOLIC) 2.5 MG tablet Take 2.5 mg by mouth daily.   Yes Historical Provider, MD  pantoprazole (PROTONIX) 40 MG tablet Take 1 tablet (40 mg total) by mouth 2 (two) times daily. 04/28/12  Yes Duke Salvia, MD  predniSONE (DELTASONE) 10 MG tablet Take 1 tablet (10 mg total) by mouth daily. 05/20/12  Yes Storm Frisk, MD  tiotropium (SPIRIVA HANDIHALER) 18 MCG inhalation capsule Place 1 capsule (18 mcg total) into inhaler and inhale daily. 02/29/12 02/28/13 Yes Storm Frisk, MD  NON FORMULARY 5 liters 24/7    Historical Provider, MD  oxyCODONE (OXY IR/ROXICODONE) 5 MG immediate release tablet Take 1 tablet (5 mg total) by mouth once as needed. 07/16/12   Marlowe Shores, PA   Allergies Allergies  Allergen Reactions  . Sulfonamide Derivatives Nausea And Vomiting    Family History Family History  Problem Relation Age of Onset  . Heart attack Brother   . Coronary artery disease Brother   . Coronary artery disease Brother   . Heart disease Brother    Social History  reports that he quit smoking about 9 years ago. His smoking use included Cigarettes. He has a 47 pack-year smoking history. He has never used smokeless tobacco. He reports that he does not drink alcohol or use illicit drugs.  Review Of Systems:  Constitutional:   No  weight loss, night sweats,  Fevers, chills, fatigue, lassitude. HEENT:   No headaches,  Difficulty swallowing,  Tooth/dental problems,  Sore throat,                No sneezing, itching, ear ache, nasal congestion, post nasal drip,   CV:  No chest pain,  Orthopnea, PND, swelling in lower extremities, anasarca, dizziness, palpitations  GI  No heartburn, indigestion, abdominal pain, nausea, vomiting, diarrhea, change in bowel habits, loss of  appetite  Resp: Notes  shortness of breath with exertion not  at rest.  No excess mucus, no productive cough,  No non-productive cough,  No coughing up of blood.  No change in color of mucus.  No wheezing.  No chest wall deformity  Skin: no rash or lesions.  GU: no dysuria, change in color of urine, no urgency or frequency.  No flank pain.  MS:  No joint pain or swelling.  No decreased  range of motion.  No back pain.  Psych:  No change in mood or affect. No depression or anxiety.  No memory loss.     Events Since Admission: 10/16 endovascular stent to aorta abdominal  Current Status: Pt with Hgb 7.7 and pt is dyspneic.   Vital Signs: Temp:  [96.9 F (36.1 C)-98.7 F (37.1 C)] 98.1 F (36.7 C) (10/17 0748) Pulse Rate:  [69-84] 81  (10/17 0436) Resp:  [9-22] 22  (10/17 0436) BP: (79-108)/(43-85) 105/59 mmHg (10/17 0436) SpO2:  [86 %-99 %] 90 % (10/17 0436) Arterial Line BP: (80-123)/(38-55) 110/49 mmHg (10/16 1700) Weight:  [184 lb 11.9 oz (83.8 kg)] 184 lb 11.9 oz (83.8 kg) (10/16 1800)  Physical Examination: General:  Pt in NAD Neuro:  Non focal  Ox3 HEENT:  No lesions Neck:  Supple, no jvd.  Cardiovascular:  RRR nl s1 s2, no s3/ s4, no periph edema Lungs:  Bibasilar rales Abdomen:  Soft NT  bsa Musculoskeletal:  From, no deformity Skin:  clear  Principal Problem:  *AAA (abdominal aortic aneurysm) Active Problems:  CARDIOMYOPATHY, ISCHEMIC  Chronic diastolic and systolic heart failure  Pulmonary fibrosis, postinflammatory  COPD with emphysema   ASSESSMENT AND PLAN  PULMONARY No results found for this basename: PHART:5,PCO2:5,PCO2ART:5,PO2ART:5,HCO3:5,O2SAT:5 in the last 168 hours 5L [place] spo2 95 at rest   CXR:  Edema and pulm fibrosis with CM   A:  IPF/UIP  COPD with emphysema, mild pulm edema post op with post op anemia P:   Diurese and transfuse Get hi flow nasal cannulae Cont BD as per outpt prob can d/c to home this pm   RENAL  Lab 07/17/12  0400 07/16/12 1325  NA 141 137  K 3.9 4.3  CL 106 102  CO2 25 28  BUN 18 22  CREATININE 1.30 1.38*  CALCIUM 8.2* 8.1*  MG -- 1.8  PHOS -- --   Intake/Output      10/16 0701 - 10/17 0700 10/17 0701 - 10/18 0700   P.O. 12    I.V. (mL/kg) 2775 (33.1)    IV Piggyback 500    Total Intake(mL/kg) 3287 (39.2)    Urine (mL/kg/hr) 1300 (0.6) 0   Blood 150    Total Output 1450 0   Net +1837 0           A:  Pos I/O  Mild renal insuff P:   chck bmet with lasix iv Give lasix 40mg  IV intra transfusion and post tfx   HEMATOLOGIC  Lab 07/17/12 0400 07/16/12 1325  HGB 7.7* 9.9*  HCT 22.7* 31.0*  PLT 143* 156  INR -- 1.38  APTT -- >200*   A:  Post op acute blood loss anemia P:  tfx one unit prbc given cardiovasc dz    Shan Levans, M.D. Pulmonary and Critical Care Medicine [facility] Pager: [phone]  07/17/2012, 8:14 AM

## 2012-07-17 NOTE — Discharge Summary (Signed)
[facility] Discharge Summary   Patient ID:  [name] MRN: [id number] DOB/AGE: [birth year] 76 y.o.  Admit date: 07/16/2012 Discharge date: 07/17/2012 Date of Surgery: 07/16/2012 Surgeon: Surgeon(s): Larina Earthly, MD  Admission Diagnosis: AAA  Discharge Diagnoses:  AAA  Secondary Diagnoses: Past Medical History  Diagnosis Date  . CAD (coronary artery disease)     s/p CABG; s/p redo CABG in 1997 and 2007; cath 4/10: LM occluded, CFX occluded, old S-RCA occluded, old S-RCA 90%, left radial to RCA ok, S-CFX ok, L-LAD ok, 2 old SVGs stumped  . Hypertension   . Hyperlipidemia   . Ischemic cardiomyopathy     echo 7/12: EF 45-50%, severe LVH, mild LAE; EF 50% per echo 2/13  . Chronic systolic heart failure     EF is 24% in the past; EF 45-50% in 7/12 and up to 50% in Feb 2013  . Carotid stenosis     doppler 11/11: 20-39% bilat.  . ICD (implantable cardiac defibrillator) in place 2010  . Hypertensive heart disease   . COPD (chronic obstructive pulmonary disease)   . History of stroke   . Hiatal hernia   . AAA (abdominal aortic aneurysm)     followed by [facility]  . Pulmonary fibrosis     followed by [name]; on chronic oxygen.   . Prolonged grief reaction   . Diabetes mellitus, insulin dependent (IDDM), controlled   . CHF (congestive heart failure)   . Myocardial infarction   . Stroke   . Shortness of breath     on home O2    Procedure(s): ABDOMINAL AORTIC ENDOVASCULAR STENT GRAFT  Discharged Condition: good  HPI:  [name] is a 76 y.o. male was seen by Dr Early for discussion regarding his abdominal aortic aneurysm. We've seen in the each ago with duplex suggesting increase in size and is here today with CT scan followup. His physical exam and past history is unchanged from his 05/06/2012 evaluation. CT scan today shows a maximal diameter of his aorta aneurysm of 6 cm. He does have some angulation of his infrarenal aortic neck and does  appear to have adequate length for stent grafting. He does have ectasia in his iliac arteries bilaterally but again appears to be a good candidate for stent grafting with good arch vessels. He has severe COPD and was a good candidate for [facility]    Hospital Course:  [name] is a 76 y.o. male is S/P  Procedure(s): ABDOMINAL AORTIC ENDOVASCULAR STENT GRAFT Extubated: POD # 0 Post-op wounds healing well Pt. Ambulating, voiding and taking PO diet without difficulty. Pt pain controlled with PO pain meds. Labs as below Complications:none Dr Delford Field evaluated pt and he was given 1 UPC and lasix IV x 2  Consults:  Treatment Team:  Md P[facility], MD  Significant Diagnostic Studies: CBC Lab Results  Component Value Date   WBC 10.0 07/17/2012   HGB 7.7* 07/17/2012   HCT 22.7* 07/17/2012   MCV 90.8 07/17/2012   PLT 143* 07/17/2012    BMET    Component Value Date/Time   NA 141 07/17/2012 0400   K 3.9 07/17/2012 0400   CL 106 07/17/2012 0400   CO2 25 07/17/2012 0400   GLUCOSE 123* 07/17/2012 0400   BUN 18 07/17/2012 0400   CREATININE 1.30 07/17/2012 0400   CREATININE 1.52* 05/06/2012 1148   CALCIUM 8.2* 07/17/2012 0400   GFRNONAA 52* 07/17/2012 0400   GFRAA 61* 07/17/2012 0400  COAG Lab Results  Component Value Date   INR 1.38 07/16/2012   INR 0.97 07/04/2012   INR 1.16 05/28/2011     Disposition:  Discharge to :Home Discharge Orders    Future Appointments: Provider: Department: Dept Phone: Center:   08/15/2012 2:30 PM Rosalio Macadamia, NP [facility] [phone] [facility]   08/19/2012 12:00 PM Larina Earthly, MD [facility] [phone] [facility]     Future Orders Please Complete By Expires   Resume previous diet      Driving Restrictions      Comments:   No driving for 4 weeks   Lifting restrictions      Comments:   No lifting for 6 weeks   Call MD for:  temperature >100.5      Call MD for:  redness, tenderness, or signs of infection (pain, swelling,  bleeding, redness, odor or green/yellow discharge around incision site)      Call MD for:  severe or increased pain, loss or decreased feeling  in affected limb(s)      Increase activity slowly      Comments:   Walk with assistance use walker or cane as needed   May shower       Scheduling Instructions:   Friday   may wash over wound with mild soap and water      Remove dressing in 24 hours      ABDOMINAL PROCEDURE/ANEURYSM REPAIR/AORTO-BIFEMORAL BYPASS:  Call MD for increased abdominal pain; cramping diarrhea; nausea/vomiting         Cicero, Noy  Home Medication Instructions [id number]   Printed on:07/17/12 1500  Medication Information                    albuterol (PROVENTIL) (2.5 MG/3ML) 0.083% nebulizer solution Take 2.5 mg by nebulization 3 (three) times daily. 2- 3 times daily           aspirin 81 MG tablet Take 81 mg by mouth daily.             NON FORMULARY 5 liters 24/7           Fluticasone-Salmeterol (ADVAIR DISKUS) 250-50 MCG/DOSE AEPB Inhale 1 puff into the lungs 2 (two) times daily.           clopidogrel (PLAVIX) 75 MG tablet Take 1 tablet (75 mg total) by mouth daily.           tiotropium (SPIRIVA HANDIHALER) 18 MCG inhalation capsule Place 1 capsule (18 mcg total) into inhaler and inhale daily.           insulin detemir (LEVEMIR) 100 UNIT/ML injection Inject 20 Units into the skin daily. And 23 units if blood sugar is >150           atorvastatin (LIPITOR) 40 MG tablet Take 1 tablet (40 mg total) by mouth daily.           furosemide (LASIX) 40 MG tablet Take 1 tablet (40 mg total) by mouth daily.           losartan (COZAAR) 25 MG tablet Take 1 tablet (25 mg total) by mouth daily. If blood pressure is greater than 120 systolic           pantoprazole (PROTONIX) 40 MG tablet Take 1 tablet (40 mg total) by mouth 2 (two) times daily.           predniSONE (DELTASONE) 10 MG tablet Take 1 tablet (10 mg total) by mouth daily.  nateglinide  (STARLIX) 60 MG tablet Take 60 mg by mouth daily.            Insulin Pen Needle (PEN NEEDLES 31GX5/16") 31G X 8 MM MISC            ezetimibe (ZETIA) 10 MG tablet Take 10 mg by mouth daily.           nebivolol (BYSTOLIC) 2.5 MG tablet Take 2.5 mg by mouth daily.           FLUoxetine (PROZAC) 40 MG capsule Take 40 mg by mouth daily.           Multiple Vitamin (MULTIVITAMIN WITH MINERALS) TABS Take 1 tablet by mouth daily.           oxyCODONE-acetaminophen (PERCOCET/ROXICET) 5-325 MG per tablet Take 1-2 tablets by mouth every 4 (four) hours as needed.            Verbal and written Discharge instructions given to the patient. Wound care per Discharge AVS Follow-up Information    Follow up with EARLY, TODD, MD. In 4 weeks. (office will arrange - sent)    Contact information:   [address] [place] [address] [phone]          Signed: Marlowe Shores 07/17/2012, 3:00 PM

## 2012-07-17 NOTE — Progress Notes (Signed)
Patient is currently active with [facility] for chronic disease management services.  Patient has been engaged by a RN Community Care Coordinator.  Patient will receive a post discharge transition of care call and will be evaluated for monthly home visits for assessments and disease process education.  Made inpatient Case Manager aware that [facility] following. Of note, [facility] services does not replace or interfere with any services that are arranged by inpatient case management or social work.  For additional questions or referrals please contact Tim Henderson BSN RN [facility]iaison at [phone].  °

## 2012-07-17 NOTE — Progress Notes (Signed)
Subjective: Interval History: none.. comfortable, denies any pain. Mild groin soreness. No abdominal discomfort. No abdominal pain. He has been up walking in the halls.  Objective: Vital signs in last 24 hours: Temp:  [96.9 F (36.1 C)-98.7 F (37.1 C)] 98.1 F (36.7 C) (10/17 0436) Pulse Rate:  [69-84] 81  (10/17 0436) Resp:  [9-22] 22  (10/17 0436) BP: (79-108)/(43-85) 105/59 mmHg (10/17 0436) SpO2:  [86 %-99 %] 90 % (10/17 0436) Arterial Line BP: (80-123)/(38-55) 110/49 mmHg (10/16 1700) Weight:  [184 lb 11.9 oz (83.8 kg)] 184 lb 11.9 oz (83.8 kg) (10/16 1800)  Intake/Output from previous day: 10/16 0701 - 10/17 0700 In: 3287 [P.O.:12; I.V.:2775; IV Piggyback:500] Out: 1450 [Urine:1300; Blood:150] Intake/Output this shift:    Abdomen soft nontender. Both groins without hematoma or false aneurysm. 2+ popliteal pulses bilaterally.  Lab Results:  Basename 07/17/12 0400 07/16/12 1325  WBC 10.0 13.1*  HGB 7.7* 9.9*  HCT 22.7* 31.0*  PLT 143* 156   BMET  Basename 07/17/12 0400 07/16/12 1325  NA 141 137  K 3.9 4.3  CL 106 102  CO2 25 28  GLUCOSE 123* 147*  BUN 18 22  CREATININE 1.30 1.38*  CALCIUM 8.2* 8.1*    Studies/Results: Dg Chest 2 View  06/17/2012  *RADIOLOGY REPORT*  Clinical Data: Follow up COPD  CHEST - 2 VIEW  Comparison: 06/02/2011  Findings: Cardiomegaly again noted.  Status post CABG.  Dual lead cardiac pacemaker is unchanged in position.  Stable peripheral and basilar interstitial prominence and chronic fibrotic changes.  No focal infiltrate or convincing pulmonary edema.  Stable mild osteopenia thoracic spine.  IMPRESSION: Status post CABG.  Dual lead cardiac pacemaker is unchanged in position.  Stable peripheral and basilar interstitial prominence and chronic fibrotic changes.  No focal infiltrate or convincing pulmonary edema.   Original Report Authenticated By: Natasha Mead, M.D.    Dg Chest Portable 1 View  07/16/2012  *RADIOLOGY REPORT*  Clinical  Data: Post abdominal aortic stent graft placement  PORTABLE CHEST - 1 VIEW  Comparison: Portable exam 1408 hours compared to 06/17/2012  Findings: Left subclavian transvenous AICD leads project over right atrium and right ventricle. Enlargement of cardiac silhouette post CABG. Pulmonary vascular congestion. Calcified tortuous aorta. Perihilar interstitial changes and suspect mild pulmonary edema. Small left basilar effusion and left basilar atelectasis. No pneumothorax. Osseous demineralization.  IMPRESSION: Enlargement of cardiac silhouette with pulmonary vascular congestion and question minimal pulmonary edema. Left basilar atelectasis and small pleural effusion.   Original Report Authenticated By: Lollie Marrow, M.D.    Dg Abd Portable 1v  07/16/2012  *RADIOLOGY REPORT*  Clinical Data: Stent graft placement.  PORTABLE ABDOMEN - 1 VIEW  Comparison: CTA abdomen and pelvis 05/20/2012.  Findings: An aortobi-iliac stent graft has been placed.  The proximal aspect of the stent graft is at the superior endplate of L1.  The limbs extend to the level of S1.  There is no radiographic evidence for complication.  The bowel gas pattern is unremarkable. There is some contrast within the kidneys and urinary bladder.  A Foley catheter is in place.  Post-traumatic changes of the left to the pubic rami are again noted.  IMPRESSION:  1.  Status post placement of an aortobi-iliac stent graft without radiographic evidence for complication. 2.  Post-traumatic changes to the left pubic rami.   Original Report Authenticated By: Jamesetta Orleans. MATTERN, M.D.    Anti-infectives: Anti-infectives     Start     Dose/Rate Route Frequency Ordered  Stop   07/16/12 2100   cefUROXime (ZINACEF) 1.5 g in dextrose 5 % 50 mL IVPB        1.5 g 100 mL/hr over 30 Minutes Intravenous Every 12 hours 07/16/12 1732 07/17/12 2059   07/15/12 1414   cefUROXime (ZINACEF) 1.5 g in dextrose 5 % 50 mL IVPB        1.5 g 100 mL/hr over 30 Minutes  Intravenous 30 min pre-op 07/15/12 1414 07/16/12 0900          Assessment/Plan: s/p Procedure(s) (LRB) with comments: ABDOMINAL AORTIC ENDOVASCULAR STENT GRAFT (N/A) - endovascular abdominal aortic stent grafting Stable postop day 1 from stent graft repair. Renal function normal. He does have a significant drop in his hemoglobin and hematocrit acute postop blood loss anemia. He did have an episode of oxygen saturations dropping while walking this morning. He does have home oxygen at 6 L and monitors his 02 saturation at home. We'll transfuse 1 unit of packed red blood cells this morning and discharge the patient following this.   LOS: 1 day   Sylvie Mifsud 07/17/2012, 7:37 AM

## 2012-07-17 NOTE — Progress Notes (Signed)
Utilization review completed.  

## 2012-07-17 NOTE — Progress Notes (Signed)
Pt and family given discharge instructions, copy given along with script for pain med. VSS at baseline. PIV's removed, no s/s of infx at incision sites.

## 2012-07-18 LAB — TYPE AND SCREEN: Unit division: 0

## 2012-07-21 ENCOUNTER — Other Ambulatory Visit: Payer: Self-pay | Admitting: Internal Medicine

## 2012-07-21 ENCOUNTER — Telehealth: Payer: Self-pay | Admitting: Vascular Surgery

## 2012-07-21 NOTE — Telephone Encounter (Signed)
Message copied by Fredrich Birks on Mon Jul 21, 2012  9:47 AM ------      Message from: Fredrich Birks      Created: Mon Jul 21, 2012  8:31 AM                   ----- Message -----         From: Melene Plan, RN         Sent: 07/16/2012   3:38 PM           To: Vallarie Mare, LPN, #                        ----- Message -----         From: Marlowe Shores, PA         Sent: 07/16/2012   1:33 PM           To: Melene Plan, RN            4 week F/U [facility] - Early       Needs CTA abd Pelvis

## 2012-07-28 ENCOUNTER — Encounter: Payer: Self-pay | Admitting: Nurse Practitioner

## 2012-07-29 ENCOUNTER — Encounter: Payer: Self-pay | Admitting: Nurse Practitioner

## 2012-08-05 ENCOUNTER — Other Ambulatory Visit: Payer: Self-pay | Admitting: Nurse Practitioner

## 2012-08-05 ENCOUNTER — Other Ambulatory Visit: Payer: Self-pay | Admitting: Internal Medicine

## 2012-08-12 ENCOUNTER — Telehealth: Payer: Self-pay | Admitting: Critical Care Medicine

## 2012-08-12 NOTE — Telephone Encounter (Signed)
Called and spoke with [employer]. They had a question on increasing patients lasix dose which was per patient.  I informed patient that it appears that lasix was last filled by cardiology and they should try calling [facility] first. Nothing further needed at this time.

## 2012-08-15 ENCOUNTER — Ambulatory Visit (INDEPENDENT_AMBULATORY_CARE_PROVIDER_SITE_OTHER): Payer: Medicare Other | Admitting: Nurse Practitioner

## 2012-08-15 ENCOUNTER — Encounter: Payer: Self-pay | Admitting: Nurse Practitioner

## 2012-08-15 VITALS — BP 120/82 | Ht 66.5 in | Wt 181.2 lb

## 2012-08-15 DIAGNOSIS — I5022 Chronic systolic (congestive) heart failure: Secondary | ICD-10-CM

## 2012-08-15 LAB — BASIC METABOLIC PANEL
BUN: 31 mg/dL — ABNORMAL HIGH (ref 6–23)
CO2: 29 mEq/L (ref 19–32)
Calcium: 9.1 mg/dL (ref 8.4–10.5)
Chloride: 103 mEq/L (ref 96–112)
Creatinine, Ser: 1.7 mg/dL — ABNORMAL HIGH (ref 0.4–1.5)
GFR: 43.48 mL/min — ABNORMAL LOW (ref >60.00)
Glucose, Bld: 188 mg/dL — ABNORMAL HIGH (ref 70–99)
Potassium: 3.7 mEq/L (ref 3.5–5.1)
Sodium: 140 mEq/L (ref 135–145)

## 2012-08-15 LAB — CBC WITH DIFFERENTIAL/PLATELET
Basophils Absolute: 0 10*3/uL (ref 0.0–0.1)
Basophils Relative: 0 % (ref 0.0–3.0)
Eosinophils Absolute: 0.1 10*3/uL (ref 0.0–0.7)
Eosinophils Relative: 1.1 % (ref 0.0–5.0)
HCT: 44 % (ref 39.0–52.0)
Hemoglobin: 14.3 g/dL (ref 13.0–17.0)
Lymphocytes Relative: 7.3 % — ABNORMAL LOW (ref 12.0–46.0)
Lymphs Abs: 0.9 10*3/uL (ref 0.7–4.0)
MCHC: 32.4 g/dL (ref 30.0–36.0)
MCV: 92.2 fl (ref 78.0–100.0)
Monocytes Absolute: 0.7 10*3/uL (ref 0.1–1.0)
Monocytes Relative: 5.6 % (ref 3.0–12.0)
Neutro Abs: 10.9 10*3/uL — ABNORMAL HIGH (ref 1.4–7.7)
Neutrophils Relative %: 86 % — ABNORMAL HIGH (ref 43.0–77.0)
Platelets: 159 10*3/uL (ref 150.0–400.0)
RBC: 4.77 Mil/uL (ref 4.22–5.81)
RDW: 17.9 % — ABNORMAL HIGH (ref 11.5–14.6)
WBC: 12.6 10*3/uL — ABNORMAL HIGH (ref 4.5–10.5)

## 2012-08-15 MED ORDER — LOSARTAN POTASSIUM 25 MG PO TABS: ORAL_TABLET | ORAL | Status: DC

## 2012-08-15 MED ORDER — FUROSEMIDE 40 MG PO TABS: ORAL_TABLET | ORAL | Status: DC

## 2012-08-15 NOTE — Progress Notes (Signed)
[name] Date of Birth: May 28, 1936 Medical Record #[id number]  History of Present Illness: [name] is seen back today for a 2 month/post hospital visit. He is seen for [name]. He has an ischemic CM. Has had CABG on 3 different occasions. Has an ICD in place. EF is now up to 50% per echo earlier this year in February. Other issues include GERD, pulmonary fibrosis, depression, diabetes and known AAA which has recently been repaired with endovascular stent repair. He is oxygen dependent. He will take Bystolic every day and the Losartan only if his BP is above 120 systolic. He has been prone to hypotension which has resulted in injury.   He had his endovascular stent repair for his AAA in mid October. Did ok. Did have post op anemia. Got one unit of blood. Seen by his PCP afterwards with some volume overload. Had been eating salt/soup. Lasix was increased. Losartan was stopped. Given some extra "stress steroids".   He comes in here today. Says he "feels like a new man". Still on his oxygen at 5 liters. Weight is down. Avoiding salt better. No chest pain. Anxious to try and start driving some next week. Back on his Plavix. No ICD shocks. No swelling. Continues on his bystolic every day.   Current Outpatient Prescriptions on File Prior to Visit  Medication Sig Dispense Refill  . albuterol (PROVENTIL) (2.5 MG/3ML) 0.083% nebulizer solution Take 2.5 mg by nebulization 3 (three) times daily. 2- 3 times daily      . aspirin 81 MG tablet Take 81 mg by mouth daily.        Marland Kitchen atorvastatin (LIPITOR) 40 MG tablet Take 1 tablet (40 mg total) by mouth daily.  30 tablet  11  . clopidogrel (PLAVIX) 75 MG tablet Take 1 tablet (75 mg total) by mouth daily.  30 tablet  6  . ezetimibe (ZETIA) 10 MG tablet Take 10 mg by mouth daily.      Marland Kitchen FLUoxetine (PROZAC) 40 MG capsule Take 40 mg by mouth daily.      . Fluticasone-Salmeterol (ADVAIR DISKUS) 250-50 MCG/DOSE AEPB Inhale 1 puff into the lungs 2 (two) times  daily.  60 each  6  . insulin detemir (LEVEMIR) 100 UNIT/ML injection Inject 20 Units into the skin daily. And 23 units if blood sugar is >150      . Insulin Pen Needle (PEN NEEDLES 31GX5/16") 31G X 8 MM MISC       . Multiple Vitamin (MULTIVITAMIN WITH MINERALS) TABS Take 1 tablet by mouth daily.      . nateglinide (STARLIX) 60 MG tablet Take 60 mg by mouth daily.       . nebivolol (BYSTOLIC) 2.5 MG tablet Take 2.5 mg by mouth daily.      . NON FORMULARY 5 liters 24/7      . pantoprazole (PROTONIX) 40 MG tablet Take 1 tablet (40 mg total) by mouth 2 (two) times daily.  60 tablet  5  . tiotropium (SPIRIVA HANDIHALER) 18 MCG inhalation capsule Place 1 capsule (18 mcg total) into inhaler and inhale daily.  30 capsule  6  . [DISCONTINUED] furosemide (LASIX) 40 MG tablet Take 1 tablet (40 mg total) by mouth daily.  30 tablet  6  . [DISCONTINUED] FLUoxetine (PROZAC) 40 MG capsule TAKE 1 CAPSULE DAILY.  30 capsule  6    Allergies  Allergen Reactions  . Sulfonamide Derivatives Nausea And Vomiting    Past Medical History  Diagnosis Date  .  CAD (coronary artery disease)     s/p CABG; s/p redo CABG in 1997 and 2007; cath 4/10: LM occluded, CFX occluded, old S-RCA occluded, old S-RCA 90%, left radial to RCA ok, S-CFX ok, L-LAD ok, 2 old SVGs stumped  . Hypertension   . Hyperlipidemia   . Ischemic cardiomyopathy     echo 7/12: EF 45-50%, severe LVH, mild LAE; EF 50% per echo 2/13  . Chronic systolic heart failure     EF is 24% in the past; EF 45-50% in 7/12 and up to 50% in Feb 2013  . Carotid stenosis     doppler 11/11: 20-39% bilat.  . ICD (implantable cardiac defibrillator) in place 2010  . Hypertensive heart disease   . COPD (chronic obstructive pulmonary disease)   . History of stroke   . Hiatal hernia   . AAA (abdominal aortic aneurysm)     followed by [facility]  . Pulmonary fibrosis     followed by [name]; on chronic oxygen.   . Prolonged grief reaction   . Diabetes mellitus, insulin  dependent (IDDM), controlled   . CHF (congestive heart failure)   . Myocardial infarction   . Stroke   . Shortness of breath     on home O2    Past Surgical History  Procedure Date  . Coronary artery bypass graft 2007    redo CABG x2 -- at that time had left radial artery graft to the distal right coronary and saphenous vein graft to the distal circumflex.   . Coronary artery bypass graft 1997    redo surgery x2 --  in January 18, 1996, and at that time had a vein graft to the diagonal and OM and a vein graft to the posterior descending and  posterolateral branches   . Coronary artery bypass graft 1992    x2 -- left internal mammary to the LAD, a single vein graft to the diagonal - obtuse marginal system  and posterior descending  . Insert / replace / remove pacemaker 01/29/2009    Medtronic Virtuoso II DR four chamber AICD  . Cardiac catheterization 01/07/2009     performed by Dr. Deborah Chalk on January 07, 2009 revealing medically manageable coronary artery disease with reduced EF at 30%   . Hernia repair 1998  . Prostate test June 2013  . Exploration post operative open heart 2007    History  Smoking status  . Former Smoker -- 1.0 packs/day for 47 years  . Types: Cigarettes  . Quit date: 03/12/2003  Smokeless tobacco  . Never Used    History  Alcohol Use No    Family History  Problem Relation Age of Onset  . Heart attack Brother   . Coronary artery disease Brother   . Coronary artery disease Brother   . Heart disease Brother     Review of Systems: The review of systems is per the HPI.  All other systems were reviewed and are negative.  Physical Exam: BP 120/82  Ht 5' 6.5" (1.689 m)  Wt 181 lb 3.2 oz (82.192 kg)  BMI 28.81 kg/m2 Heart rate is about 80 by me.  Patient is very pleasant and in no acute distress. He is very upbeat today.  He has his oxygen in place at liters. Skin is warm and dry. Color is normal.  HEENT is unremarkable. Normocephalic/atraumatic. PERRL.  Sclera are nonicteric. Neck is supple. No masses. No JVD. Lungs are fairly clear. Cardiac exam shows a regular rate and rhythm. Abdomen  is soft. Extremities are without edema. Gait and ROM are intact. No gross neurologic deficits noted.   LABORATORY DATA: BMET and CBC are pending for today  Lab Results  Component Value Date   WBC 10.0 07/17/2012   HGB 7.7* 07/17/2012   HCT 22.7* 07/17/2012   PLT 143* 07/17/2012   GLUCOSE 123* 07/17/2012   CHOL 143 03/12/2011   TRIG 148.0 03/12/2011   HDL 34.00* 03/12/2011   LDLCALC 79 03/12/2011   ALT 20 07/04/2012   AST 26 07/04/2012   NA 141 07/17/2012   K 4.6 07/17/2012   CL 106 07/17/2012   CREATININE 1.30 07/17/2012   BUN 18 07/17/2012   CO2 25 07/17/2012   TSH 3.772 05/29/2011   INR 1.38 07/16/2012   HGBA1C 7.6* 07/17/2012   Assessment / Plan: 1. Ischemic CM - EF is up to 50% per echo in February of this year. No chest pain reported. His weight is back down. Down 3 pounds today. Will try to add back the Losartan and have him take only when systolic BP is above 120. Avoid salt.   2. Recent AAA stent repair - doing ok. Sees [facility] next week.   3. Post op anemia - will recheck his labs today.   4. Severe pulmonary fibrosis - oxygen dependent. On chronic steroid therapy.  5. Depression - actually seems pretty upbeat today.   I will see him back in a month. Needs his ICD checked on return. Check labs today. Patient is agreeable to this plan and will call if any problems develop in the interim.

## 2012-08-15 NOTE — Patient Instructions (Addendum)
I would like to cut your Lasix back to just one pill a day  I would like for you to take the Losartan again, but only if your blood pressure is above 120  Stay on your other medicines  We will check labs today  I will see you in a month and we will check your ICD that day  Call the [facility] office at [phone] if you have any questions, problems or concerns.

## 2012-08-18 ENCOUNTER — Encounter: Payer: Self-pay | Admitting: Vascular Surgery

## 2012-08-19 ENCOUNTER — Encounter: Payer: Self-pay | Admitting: Vascular Surgery

## 2012-08-19 ENCOUNTER — Ambulatory Visit
Admission: RE | Admit: 2012-08-19 | Discharge: 2012-08-19 | Disposition: A | Discharge status: Home / Self Care | Payer: Medicare Other | Source: Ambulatory Visit | Attending: Vascular Surgery | Admitting: Vascular Surgery

## 2012-08-19 ENCOUNTER — Ambulatory Visit (INDEPENDENT_AMBULATORY_CARE_PROVIDER_SITE_OTHER): Payer: Medicare Other | Admitting: Vascular Surgery

## 2012-08-19 ENCOUNTER — Encounter: Payer: Medicare Other | Admitting: Vascular Surgery

## 2012-08-19 VITALS — BP 121/82 | HR 75 | Temp 97.6°F | Ht 66.5 in | Wt 181.3 lb

## 2012-08-19 DIAGNOSIS — Z48812 Encounter for surgical aftercare following surgery on the circulatory system: Secondary | ICD-10-CM

## 2012-08-19 DIAGNOSIS — I714 Abdominal aortic aneurysm, without rupture: Secondary | ICD-10-CM

## 2012-08-19 MED ORDER — IOHEXOL 350 MG/ML SOLN
60.0000 mL | Freq: Once | INTRAVENOUS | Status: AC | PRN
  Administered 2012-08-19: 60 mL via INTRAVENOUS

## 2012-08-19 NOTE — Progress Notes (Signed)
Patient presents today for followup after stent graft repair of abdominal aortic aneurysm on 07/16/2012. He did well was discharged home on postoperative day 1. He did report that he had a kidney infection and some fluid overload and was treated with this and feels quite good. He had no difficulty with his groin punctures.  Physical exam reveals well-developed well-nourished white male no acute distress. He is on chronic oxygen therapy. Abdominal exam is nontender. The groin punctures are without false aneurysm. He does have palpable popliteal pulses.  CT scan was reviewed with the patient. This does show excellent positioning of the stent graft with no evidence of endoleak.  Impression and plan stable one month followup after stent graft repair of abdominal aortic aneurysm. Will be seen again in 6 months with repeat CT scan.

## 2012-08-20 NOTE — Addendum Note (Signed)
Addended by: Sharee Pimple on: 08/20/2012 10:55 AM   Modules accepted: Orders

## 2012-09-09 ENCOUNTER — Other Ambulatory Visit: Payer: Self-pay | Admitting: Nurse Practitioner

## 2012-09-16 ENCOUNTER — Other Ambulatory Visit: Payer: Self-pay | Admitting: Critical Care Medicine

## 2012-09-18 ENCOUNTER — Telehealth: Payer: Self-pay | Admitting: Critical Care Medicine

## 2012-09-18 MED ORDER — FLUTICASONE-SALMETEROL 250-50 MCG/DOSE IN AEPB: 1.0000 | INHALATION_SPRAY | Freq: Two times a day (BID) | RESPIRATORY_TRACT | Status: DC

## 2012-09-18 MED ORDER — PREDNISONE 10 MG PO TABS: 20.0000 mg | ORAL_TABLET | Freq: Every day | ORAL | Status: DC

## 2012-09-18 NOTE — Addendum Note (Signed)
Addended by: Caryl Ada on: 09/18/2012 02:01 PM   Modules accepted: Orders

## 2012-09-18 NOTE — Telephone Encounter (Signed)
Duplicate

## 2012-09-18 NOTE — Telephone Encounter (Signed)
Rx has been sent in. 

## 2012-09-18 NOTE — Telephone Encounter (Signed)
Spoke to Kennard and gave her the verbal to give the pt 90 day supply of Spiriva. A 90 day supply of Advair will be sent to his pharmacy.

## 2012-09-19 ENCOUNTER — Encounter (INDEPENDENT_AMBULATORY_CARE_PROVIDER_SITE_OTHER): Payer: Medicare Other

## 2012-09-19 ENCOUNTER — Ambulatory Visit (INDEPENDENT_AMBULATORY_CARE_PROVIDER_SITE_OTHER): Payer: Medicare Other | Admitting: Nurse Practitioner

## 2012-09-19 ENCOUNTER — Encounter: Payer: Self-pay | Admitting: Nurse Practitioner

## 2012-09-19 VITALS — BP 140/88 | HR 72 | Ht 66.5 in | Wt 182.4 lb

## 2012-09-19 DIAGNOSIS — R0989 Other specified symptoms and signs involving the circulatory and respiratory systems: Secondary | ICD-10-CM

## 2012-09-19 DIAGNOSIS — Z95810 Presence of automatic (implantable) cardiac defibrillator: Secondary | ICD-10-CM

## 2012-09-19 DIAGNOSIS — I5022 Chronic systolic (congestive) heart failure: Secondary | ICD-10-CM

## 2012-09-19 LAB — BASIC METABOLIC PANEL
BUN: 16 mg/dL (ref 6–23)
CO2: 31 mEq/L (ref 19–32)
Calcium: 8.8 mg/dL (ref 8.4–10.5)
Chloride: 105 mEq/L (ref 96–112)
Creatinine, Ser: 1.4 mg/dL (ref 0.4–1.5)
GFR: 54.8 mL/min — ABNORMAL LOW (ref >60.00)
Glucose, Bld: 66 mg/dL — ABNORMAL LOW (ref 70–99)
Potassium: 3.5 mEq/L (ref 3.5–5.1)
Sodium: 144 mEq/L (ref 135–145)

## 2012-09-19 NOTE — Patient Instructions (Addendum)
I think you are doing ok.  Stay on your current medicines  I want to check some labs today to check on your kidney function  I will see you in 2 months  Your defibrillator is working fine  Call the [facility] office at [phone] if you have any questions, problems or concerns.

## 2012-09-19 NOTE — Progress Notes (Signed)
[name] Date of Birth: [birth year] Medical Record #[id number]  History of Present Illness: [name] is seen back today for a one month check. He is seen for [name]. He has an ischemic CM. Had CABG on 3 different occasions. Has an ICD in place. EF is now up to 50% per echo earlier this year in February. Other issues include GERD, pulmonary fibrosis, depression, diabetes, and known AAA which was recently repaired with endovascular stent repair. He is oxygen dependent. He takes his Bystolic every day and Losartan only if his BP is above 120 systolic. He has been prone to hypotension which has resulted in physical injury in the past.   He comes back today. He is here alone but his daughter in law brought him. He is doing ok from our standpoint. He did get a little nauseated as he was coming in to office. He has not eaten since 8 am because he was planning on "hitting the [employer]" this afternoon. Remains on his oxygen at 5 to 6 liters, depending on activity. No chest pain. Breathing is at his baseline for him. His blood pressure and weight chart are reviewed and look ok.   Current Outpatient Prescriptions on File Prior to Visit  Medication Sig Dispense Refill  . albuterol (PROVENTIL) (2.5 MG/3ML) 0.083% nebulizer solution Take 2.5 mg by nebulization 3 (three) times daily. 2- 3 times daily      . aspirin 81 MG tablet Take 81 mg by mouth daily.        Marland Kitchen atorvastatin (LIPITOR) 40 MG tablet Take 1 tablet (40 mg total) by mouth daily.  30 tablet  11  . clopidogrel (PLAVIX) 75 MG tablet TAKE 1 TABLET ONCE DAILY.  30 tablet  6  . ezetimibe (ZETIA) 10 MG tablet Take 10 mg by mouth daily.      Marland Kitchen FLUoxetine (PROZAC) 40 MG capsule Take 40 mg by mouth daily.      . Fluticasone-Salmeterol (ADVAIR DISKUS) 250-50 MCG/DOSE AEPB Inhale 1 puff into the lungs 2 (two) times daily.  180 each  1  . furosemide (LASIX) 40 MG tablet One a day  30 tablet    . insulin detemir (LEVEMIR) 100 UNIT/ML injection  Inject 20 Units into the skin daily. And 23 units if blood sugar is >150      . Insulin Pen Needle (PEN NEEDLES 31GX5/16") 31G X 8 MM MISC       . losartan (COZAAR) 25 MG tablet Take only if blood pressure above 120  90 tablet  3  . Multiple Vitamin (MULTIVITAMIN WITH MINERALS) TABS Take 1 tablet by mouth daily.      . nateglinide (STARLIX) 60 MG tablet Take 60 mg by mouth daily.       . nebivolol (BYSTOLIC) 2.5 MG tablet Take 2.5 mg by mouth daily.      . NON FORMULARY 5 liters 24/7      . pantoprazole (PROTONIX) 40 MG tablet Take 1 tablet (40 mg total) by mouth 2 (two) times daily.  60 tablet  5  . predniSONE (DELTASONE) 10 MG tablet Take 2 tablets (20 mg total) by mouth daily.  60 tablet  2  . SPIRIVA HANDIHALER 18 MCG inhalation capsule PLACE 1 CAPSULE INTO INHALER AND INHALE DAILY.  30 each  6    Allergies  Allergen Reactions  . Sulfonamide Derivatives Nausea And Vomiting    Past Medical History  Diagnosis Date  . CAD (coronary artery disease)  s/p CABG; s/p redo CABG in 1997 and 2007; cath 4/10: LM occluded, CFX occluded, old S-RCA occluded, old S-RCA 90%, left radial to RCA ok, S-CFX ok, L-LAD ok, 2 old SVGs stumped  . Hypertension   . Hyperlipidemia   . Ischemic cardiomyopathy     echo 7/12: EF 45-50%, severe LVH, mild LAE; EF 50% per echo 2/13  . Chronic systolic heart failure     EF is 24% in the past; EF 45-50% in 7/12 and up to 50% in Feb 2013  . Carotid stenosis     doppler 11/11: 20-39% bilat.  . ICD (implantable cardiac defibrillator) in place 2010  . Hypertensive heart disease   . COPD (chronic obstructive pulmonary disease)   . History of stroke   . Hiatal hernia   . AAA (abdominal aortic aneurysm)     followed by [facility]  . Pulmonary fibrosis     followed by [name]; on chronic oxygen.   . Prolonged grief reaction   . Diabetes mellitus, insulin dependent (IDDM), controlled   . CHF (congestive heart failure)   . Myocardial infarction   . Stroke   .  Shortness of breath     on home O2    Past Surgical History  Procedure Date  . Coronary artery bypass graft 2007    redo CABG x2 -- at that time had left radial artery graft to the distal right coronary and saphenous vein graft to the distal circumflex.   . Coronary artery bypass graft 1997    redo surgery x2 --  in January 18, 1996, and at that time had a vein graft to the diagonal and OM and a vein graft to the posterior descending and  posterolateral branches   . Coronary artery bypass graft 1992    x2 -- left internal mammary to the LAD, a single vein graft to the diagonal - obtuse marginal system  and posterior descending  . Insert / replace / remove pacemaker 01/29/2009    Medtronic Virtuoso II DR four chamber AICD  . Cardiac catheterization 01/07/2009     performed by Dr. Deborah Chalk on January 07, 2009 revealing medically manageable coronary artery disease with reduced EF at 30%   . Hernia repair 1998  . Prostate test June 2013  . Exploration post operative open heart 2007  . Abdominal aortic aneurysm repair 07/16/2012    History  Smoking status  . Former Smoker -- 1.0 packs/day for 47 years  . Types: Cigarettes  . Quit date: 03/12/2003  Smokeless tobacco  . Never Used    History  Alcohol Use No    Family History  Problem Relation Age of Onset  . Heart attack Brother   . Coronary artery disease Brother   . Coronary artery disease Brother   . Heart disease Brother     Review of Systems: The review of systems is per the HPI.  All other systems were reviewed and are negative.  Physical Exam: BP 140/88  Pulse 72  Ht 5' 6.5" (1.689 m)  Wt 182 lb 6.4 oz (82.736 kg)  BMI 29.00 kg/m2 Weight is stable. Only up one pound.  Patient is very pleasant and in no acute distress. He has oxygen in place. Skin is warm and dry. Color is normal.  HEENT is unremarkable. Normocephalic/atraumatic. PERRL. Sclera are nonicteric. Neck is supple. No masses. No JVD. Lungs are clear. Cardiac  exam shows a regular rate and rhythm. Abdomen is soft. Extremities are without edema. Gait and  ROM are intact. No gross neurologic deficits noted.   LABORATORY DATA: BMET is pending for today.   Lab Results  Component Value Date   WBC 12.6* 08/15/2012   HGB 14.3 08/15/2012   HCT 44.0 08/15/2012   PLT 159.0 08/15/2012   GLUCOSE 188* 08/15/2012   CHOL 143 03/12/2011   TRIG 148.0 03/12/2011   HDL 34.00* 03/12/2011   LDLCALC 79 03/12/2011   ALT 20 07/04/2012   AST 26 07/04/2012   NA 140 08/15/2012   K 3.7 08/15/2012   CL 103 08/15/2012   CREATININE 1.7* 08/15/2012   BUN 31* 08/15/2012   CO2 29 08/15/2012   TSH 3.772 05/29/2011   INR 1.38 07/16/2012   HGBA1C 7.6* 07/17/2012    Assessment / Plan: 1. Ischemic CM - EF is 50% per echo in February of this year. Weight is stable. His heart failure status seems stable.  2. Recent AAA repair - sees [facility] next year.  3. Severe pulmonary fibrosis - remains on oxygen  4. DM - having some hypoglycemia today. Patient was given a Coke to drink and had complete resolution. He is reminded to eat more frequently, especially when he has taken his diabetic medicines.  5. ICD - checked today by [facility]. 2 NSVT's noted. Overall his device is functioning satisfactorily.   I will see him back in about 2 months. Check BMET today.   Patient is agreeable to this plan and will call if any problems develop in the interim.

## 2012-09-22 ENCOUNTER — Encounter: Payer: Self-pay | Admitting: Internal Medicine

## 2012-09-22 LAB — ICD DEVICE OBSERVATION
AL AMPLITUDE: 2.4 mv
ATRIAL PACING ICD: 67.4 pct
BATTERY VOLTAGE: 3.03 V
RV LEAD IMPEDENCE ICD: 494 Ohm
TZAT-0001ATACH: 3
TZAT-0001FASTVT: 1
TZAT-0002ATACH: NEGATIVE
TZAT-0011SLOWVT: 10 ms
TZAT-0011SLOWVT: 10 ms
TZAT-0012ATACH: 150 ms
TZAT-0012ATACH: 150 ms
TZAT-0012FASTVT: 200 ms
TZAT-0012SLOWVT: 200 ms
TZAT-0012SLOWVT: 200 ms
TZAT-0013SLOWVT: 2
TZAT-0018SLOWVT: NEGATIVE
TZAT-0018SLOWVT: NEGATIVE
TZAT-0019ATACH: 6 V
TZAT-0019ATACH: 6 V
TZAT-0019SLOWVT: 8 V
TZAT-0019SLOWVT: 8 V
TZAT-0020ATACH: 1.5 ms
TZAT-0020ATACH: 1.5 ms
TZAT-0020ATACH: 1.5 ms
TZAT-0020SLOWVT: 1.5 ms
TZAT-0020SLOWVT: 1.5 ms
TZON-0003SLOWVT: 340 ms
TZON-0003VSLOWVT: 400 ms
TZON-0005SLOWVT: 12
TZST-0001ATACH: 4
TZST-0001ATACH: 5
TZST-0001ATACH: 6
TZST-0001FASTVT: 2
TZST-0001FASTVT: 3
TZST-0001FASTVT: 4
TZST-0001FASTVT: 5
TZST-0001SLOWVT: 4
TZST-0001SLOWVT: 5
TZST-0002ATACH: NEGATIVE
TZST-0002FASTVT: NEGATIVE
TZST-0002FASTVT: NEGATIVE
TZST-0003SLOWVT: 35 J
TZST-0003SLOWVT: 35 J
TZST-0003SLOWVT: 35 J

## 2012-09-23 ENCOUNTER — Telehealth: Payer: Self-pay | Admitting: *Deleted

## 2012-09-23 NOTE — Telephone Encounter (Signed)
Mailed copy of labs and left message to call if any questions  

## 2012-09-23 NOTE — Telephone Encounter (Signed)
Message copied by Burnell Blanks on Tue Sep 23, 2012  8:31 AM ------      Message from: Rosalio Macadamia      Created: Fri Sep 19, 2012  6:22 PM       Ok to report. Labs are satisfactory. His blood sugar was low. Remind him to not skip meals while taking diabetic medicine/insulin.

## 2012-09-30 ENCOUNTER — Encounter: Payer: Self-pay | Admitting: *Deleted

## 2012-10-02 ENCOUNTER — Encounter: Payer: Self-pay | Admitting: *Deleted

## 2012-10-17 ENCOUNTER — Encounter: Payer: Self-pay | Admitting: Adult Health

## 2012-10-17 ENCOUNTER — Ambulatory Visit (INDEPENDENT_AMBULATORY_CARE_PROVIDER_SITE_OTHER): Payer: Medicare Other | Admitting: Adult Health

## 2012-10-17 VITALS — BP 110/72 | HR 76 | Temp 97.7°F | Ht 66.5 in | Wt 181.8 lb

## 2012-10-17 DIAGNOSIS — J439 Emphysema, unspecified: Secondary | ICD-10-CM

## 2012-10-17 DIAGNOSIS — J438 Other emphysema: Secondary | ICD-10-CM

## 2012-10-17 DIAGNOSIS — J8410 Pulmonary fibrosis, unspecified: Secondary | ICD-10-CM

## 2012-10-17 NOTE — Patient Instructions (Addendum)
Continue on current regimen.  Keep up good work  Activity as tolerated.  Follow up [name]  In 2 months

## 2012-10-19 NOTE — Assessment & Plan Note (Signed)
Compensated on present regimen  follow up 3 months with [name]

## 2012-10-19 NOTE — Progress Notes (Signed)
Subjective:    Patient ID: [name], male    DOB: [birth year], 77 y.o.   MRN: [id number]  HPI  77 y.o.WM  Hosp 7/10 -04/15/11 for CHF, resp failure, Ischemic CM 45-50%   COPD/ Pulm Fibrosis  D/c on oxygen.  CT neg for PE,  pulm infiltrates/fibrosis  01/14/2012 Dyspnea is same, feels better overall.  Is able to take bystolic daily, Losartan not taking every day. Still on oxygen   06/17/2012 Not seen since 12/2011.  F/u Copd, IPF, CHF.  Pt notes unchanged dyspnea.  Still on high flow oxygen.  Not on an oxygen conserving device.   Pt has AAA and needs stent graft. Pt denies edema in feet.  No real mucus.  No chest pain.  No real wheeze.  No abdominal pain noted.  10/17/12 Follow up  4 month follow up - had AAA 07/2012; is doing well since surgery. Says he is doing well on Spiriva and Advair  Continues on prednisone 10mg  daily .  No flare in cough and dsypnea.  Says since surgery feels much better.  No hemoptysis or edema.    Past Medical History  Diagnosis Date  . CAD (coronary artery disease)     s/p CABG; s/p redo CABG in 1997 and 2007; cath 4/10: LM occluded, CFX occluded, old S-RCA occluded, old S-RCA 90%, left radial to RCA ok, S-CFX ok, L-LAD ok, 2 old SVGs stumped  . Hypertension   . Hyperlipidemia   . Ischemic cardiomyopathy     echo 7/12: EF 45-50%, severe LVH, mild LAE; EF 50% per echo 2/13  . Chronic systolic heart failure     EF is 24% in the past; EF 45-50% in 7/12 and up to 50% in Feb 2013  . Carotid stenosis     doppler 11/11: 20-39% bilat.  . ICD (implantable cardiac defibrillator) in place 2010  . Hypertensive heart disease   . COPD (chronic obstructive pulmonary disease)   . History of stroke   . Hiatal hernia   . AAA (abdominal aortic aneurysm)     followed by [facility]  . Pulmonary fibrosis     followed by [name]; on chronic oxygen.   . Prolonged grief reaction   . Diabetes mellitus, insulin dependent (IDDM), controlled   . CHF (congestive heart  failure)   . Myocardial infarction   . Stroke   . Shortness of breath     on home O2     Family History  Problem Relation Age of Onset  . Heart attack Brother   . Coronary artery disease Brother   . Coronary artery disease Brother   . Heart disease Brother      History   Social History  . Marital Status: Widowed    Spouse Name: N/A    Number of Children: N/A  . Years of Education: N/A   Occupational History  . Not on file.   Social History Main Topics  . Smoking status: Former Smoker -- 1.0 packs/day for 47 years    Types: Cigarettes    Quit date: 03/12/2003  . Smokeless tobacco: Never Used  . Alcohol Use: No  . Drug Use: No  . Sexually Active: No   Other Topics Concern  . Not on file   Social History Narrative  . No narrative on file     Allergies  Allergen Reactions  . Sulfonamide Derivatives Nausea And Vomiting     Outpatient Prescriptions Prior to Visit  Medication Sig Dispense Refill  .  albuterol (PROVENTIL) (2.5 MG/3ML) 0.083% nebulizer solution Take 2.5 mg by nebulization 3 (three) times daily. 2- 3 times daily      . aspirin 81 MG tablet Take 81 mg by mouth daily.        Marland Kitchen atorvastatin (LIPITOR) 40 MG tablet Take 1 tablet (40 mg total) by mouth daily.  30 tablet  11  . clopidogrel (PLAVIX) 75 MG tablet TAKE 1 TABLET ONCE DAILY.  30 tablet  6  . ezetimibe (ZETIA) 10 MG tablet Take 10 mg by mouth daily.      Marland Kitchen FLUoxetine (PROZAC) 40 MG capsule Take 40 mg by mouth daily.      . Fluticasone-Salmeterol (ADVAIR DISKUS) 250-50 MCG/DOSE AEPB Inhale 1 puff into the lungs 2 (two) times daily.  180 each  1  . furosemide (LASIX) 40 MG tablet One a day  30 tablet    . insulin detemir (LEVEMIR) 100 UNIT/ML injection Inject 20 Units into the skin daily. And 23 units if blood sugar is >150      . Insulin Pen Needle (PEN NEEDLES 31GX5/16") 31G X 8 MM MISC       . losartan (COZAAR) 25 MG tablet Take only if blood pressure above 120  90 tablet  3  . Multiple Vitamin  (MULTIVITAMIN WITH MINERALS) TABS Take 1 tablet by mouth daily.      . nateglinide (STARLIX) 60 MG tablet Take 60 mg by mouth daily.       . nebivolol (BYSTOLIC) 2.5 MG tablet Take 2.5 mg by mouth daily.      . NON FORMULARY 5 liters 24/7      . pantoprazole (PROTONIX) 40 MG tablet Take 1 tablet (40 mg total) by mouth 2 (two) times daily.  60 tablet  5  . predniSONE (DELTASONE) 10 MG tablet Take 2 tablets (20 mg total) by mouth daily.  60 tablet  2  . SPIRIVA HANDIHALER 18 MCG inhalation capsule PLACE 1 CAPSULE INTO INHALER AND INHALE DAILY.  30 each  6  Last reviewed on 10/17/2012  3:52 PM by Sherre Lain, MA    Review of Systems  Constitutional:   No  weight loss, night sweats,  Fevers, chills, fatigue, lassitude. HEENT:   No headaches,  Difficulty swallowing,  Tooth/dental problems,  Sore throat,                No sneezing, itching, ear ache, nasal congestion, post nasal drip,   CV:  No chest pain,  Orthopnea, PND, swelling in lower extremities, anasarca, dizziness, palpitations  GI  No heartburn, indigestion, abdominal pain, nausea, vomiting, diarrhea, change in bowel habits, loss of appetite  Resp: Notes  shortness of breath with exertion not  at rest.  No excess mucus, no productive cough,  No non-productive cough,  No coughing up of blood.  No change in color of mucus.  No wheezing.  No chest wall deformity  Skin: no rash or lesions.  GU: no dysuria, change in color of urine, no urgency or frequency.  No flank pain.  MS:  No joint pain or swelling.  No decreased range of motion.  No back pain.  Psych:  No change in mood or affect. No depression or anxiety.  No memory loss.     Objective:   Physical Exam  Filed Vitals:   10/17/12 1552  BP: 110/72  Pulse: 76  Temp: 97.7 F (36.5 C)  TempSrc: Oral  Height: 5' 6.5" (1.689 m)  Weight: 181 lb 12.8  oz (82.464 kg)  SpO2: 91%    Gen: Pleasant, well-nourished, in no distress,  normal affect  ENT: No lesions,   mouth clear,  oropharynx clear, no postnasal drip  Neck: No JVD, no TMG, no carotid bruits  Lungs: No use of accessory muscles, no dullness to percussion, distant bs   Cardiovascular: RRR, heart sounds normal, no murmur or gallops, no peripheral edema  Abdomen: soft and NT, no HSM,  BS normal  Musculoskeletal: No deformities, no cyanosis or clubbing  Neuro: alert, non focal  Skin: Warm, no lesions or rashes        Assessment & Plan:   COPD with emphysema Compensated on present regimen  follow up 3 months with [name]     Note a flu vaccination was given on this visit 10/19/2012  Updated Medication List Outpatient Encounter Prescriptions as of 10/17/2012  Medication Sig Dispense Refill  . albuterol (PROVENTIL) (2.5 MG/3ML) 0.083% nebulizer solution Take 2.5 mg by nebulization 3 (three) times daily. 2- 3 times daily      . aspirin 81 MG tablet Take 81 mg by mouth daily.        Marland Kitchen atorvastatin (LIPITOR) 40 MG tablet Take 1 tablet (40 mg total) by mouth daily.  30 tablet  11  . clopidogrel (PLAVIX) 75 MG tablet TAKE 1 TABLET ONCE DAILY.  30 tablet  6  . ezetimibe (ZETIA) 10 MG tablet Take 10 mg by mouth daily.      Marland Kitchen FLUoxetine (PROZAC) 40 MG capsule Take 40 mg by mouth daily.      . Fluticasone-Salmeterol (ADVAIR DISKUS) 250-50 MCG/DOSE AEPB Inhale 1 puff into the lungs 2 (two) times daily.  180 each  1  . furosemide (LASIX) 40 MG tablet One a day  30 tablet    . insulin detemir (LEVEMIR) 100 UNIT/ML injection Inject 20 Units into the skin daily. And 23 units if blood sugar is >150      . Insulin Pen Needle (PEN NEEDLES 31GX5/16") 31G X 8 MM MISC       . losartan (COZAAR) 25 MG tablet Take only if blood pressure above 120  90 tablet  3  . Multiple Vitamin (MULTIVITAMIN WITH MINERALS) TABS Take 1 tablet by mouth daily.      . nateglinide (STARLIX) 60 MG tablet Take 60 mg by mouth daily.       . nebivolol (BYSTOLIC) 2.5 MG tablet Take 2.5 mg by mouth daily.      . NON  FORMULARY 5 liters 24/7      . pantoprazole (PROTONIX) 40 MG tablet Take 1 tablet (40 mg total) by mouth 2 (two) times daily.  60 tablet  5  . predniSONE (DELTASONE) 10 MG tablet Take 2 tablets (20 mg total) by mouth daily.  60 tablet  2  . SPIRIVA HANDIHALER 18 MCG inhalation capsule PLACE 1 CAPSULE INTO INHALER AND INHALE DAILY.  30 each  6

## 2012-10-19 NOTE — Assessment & Plan Note (Signed)
Compensated  Cont on pred

## 2012-11-01 DIAGNOSIS — K625 Hemorrhage of anus and rectum: Secondary | ICD-10-CM

## 2012-11-01 HISTORY — DX: Hemorrhage of anus and rectum: K62.5

## 2012-11-19 ENCOUNTER — Encounter: Payer: Self-pay | Admitting: Nurse Practitioner

## 2012-11-19 ENCOUNTER — Ambulatory Visit (INDEPENDENT_AMBULATORY_CARE_PROVIDER_SITE_OTHER): Payer: Medicare Other | Admitting: Nurse Practitioner

## 2012-11-19 VITALS — BP 138/82 | HR 70 | Ht 66.5 in | Wt 179.0 lb

## 2012-11-19 DIAGNOSIS — I255 Ischemic cardiomyopathy: Secondary | ICD-10-CM

## 2012-11-19 DIAGNOSIS — I2589 Other forms of chronic ischemic heart disease: Secondary | ICD-10-CM

## 2012-11-19 NOTE — Progress Notes (Signed)
[name] Date of Birth: Apr 11, 1936 Medical Record #[id number]  History of Present Illness: [name] is seen back today for a 2 month check. He is seen for [name]. He has an ischemic CM. Had CABG on 3 separate occasions. Has an ICD in place. EF is now up to 50% per echo in February of 2013. Other issues include GERD, pulmonary fibrosis (severe), depression, diabetes, and known AAA with prior endovascular stent repair in 2013. He is oxygen dependent. He takes his Bystolic every day and Losartan only if his BP is above 120 systolic. Has been prone to hypotension which has resulted in physical injury in the past.   I saw him back in December. He was doing ok.   He comes back today. He is in the exam room alone. Doing ok. Remains mostly limited by his breathing. No chest pain. No ICD shocks. Needs his device checked in March. Has done ok up until earlier this month. Had a very hard stool and started having rectal bleeding. Apparently had a tear. His Lasix was held for about 5 days. Plavix was held for a week. Off the Losartan as well but has just started back taking if BP is above 120. No more bleeding. Has labs planned with Dr. Lorin Picket later this week.   Current Outpatient Prescriptions on File Prior to Visit  Medication Sig Dispense Refill  . albuterol (PROVENTIL) (2.5 MG/3ML) 0.083% nebulizer solution Take 2.5 mg by nebulization 3 (three) times daily. 2- 3 times daily      . aspirin 81 MG tablet Take 81 mg by mouth daily.        Marland Kitchen atorvastatin (LIPITOR) 40 MG tablet Take 1 tablet (40 mg total) by mouth daily.  30 tablet  11  . clopidogrel (PLAVIX) 75 MG tablet TAKE 1 TABLET ONCE DAILY.  30 tablet  6  . ezetimibe (ZETIA) 10 MG tablet Take 10 mg by mouth daily.      Marland Kitchen FLUoxetine (PROZAC) 40 MG capsule Take 40 mg by mouth daily.      . Fluticasone-Salmeterol (ADVAIR DISKUS) 250-50 MCG/DOSE AEPB Inhale 1 puff into the lungs 2 (two) times daily.  180 each  1  . furosemide (LASIX) 40 MG tablet  One a day  30 tablet    . insulin detemir (LEVEMIR) 100 UNIT/ML injection Inject 20 Units into the skin daily. And 23 units if blood sugar is >150      . Insulin Pen Needle (PEN NEEDLES 31GX5/16") 31G X 8 MM MISC       . losartan (COZAAR) 25 MG tablet Take only if blood pressure above 120  90 tablet  3  . Multiple Vitamin (MULTIVITAMIN WITH MINERALS) TABS Take 1 tablet by mouth daily.      . nateglinide (STARLIX) 60 MG tablet Take 60 mg by mouth daily.       . nebivolol (BYSTOLIC) 2.5 MG tablet Take 2.5 mg by mouth daily.      . predniSONE (DELTASONE) 10 MG tablet Take 2 tablets (20 mg total) by mouth daily.  60 tablet  2  . SPIRIVA HANDIHALER 18 MCG inhalation capsule PLACE 1 CAPSULE INTO INHALER AND INHALE DAILY.  30 each  6  . NON FORMULARY 5 liters 24/7      . pantoprazole (PROTONIX) 40 MG tablet Take 1 tablet (40 mg total) by mouth 2 (two) times daily.  60 tablet  5   No current facility-administered medications on file prior to visit.  Allergies  Allergen Reactions  . Sulfonamide Derivatives Nausea And Vomiting    Past Medical History  Diagnosis Date  . CAD (coronary artery disease)     s/p CABG; s/p redo CABG in 1997 and 2007; cath 4/10: LM occluded, CFX occluded, old S-RCA occluded, old S-RCA 90%, left radial to RCA ok, S-CFX ok, L-LAD ok, 2 old SVGs stumped  . Hypertension   . Hyperlipidemia   . Ischemic cardiomyopathy     echo 7/12: EF 45-50%, severe LVH, mild LAE; EF 50% per echo 2/13  . Chronic systolic heart failure     EF is 24% in the past; EF 45-50% in 7/12 and up to 50% in Feb 2013  . Carotid stenosis     doppler 11/11: 20-39% bilat.  . ICD (implantable cardiac defibrillator) in place 2010  . Hypertensive heart disease   . COPD (chronic obstructive pulmonary disease)   . History of stroke   . Hiatal hernia   . AAA (abdominal aortic aneurysm)     follow[facility] by [facility]  . Pulmonary fibrosis     followed by [name]; on chronic oxygen.   . Prolonged grief reaction    . Diabetes mellitus, insulin dependent (IDDM), controlled   . CHF (congestive heart failure)   . Myocardial infarction   . Stroke   . Shortness of breath     on home O2    Past Surgical History  Procedure Laterality Date  . Coronary artery bypass graft  2007    redo CABG x2 -- at that time had left radial artery graft to the distal right coronary and saphenous vein graft to the distal circumflex.   . Coronary artery bypass graft  1997    redo surgery x2 --  in January 18, 1996, and at that time had a vein graft to the diagonal and OM and a vein graft to the posterior descending and  posterolateral branches   . Coronary artery bypass graft  1992    x2 -- left internal mammary to the LAD, a single vein graft to the diagonal - obtuse marginal system  and posterior descending  . Insert / replace / remove pacemaker  01/29/2009    Medtronic Virtuoso II DR four chamber AICD  . Cardiac catheterization  01/07/2009     performed by Dr. Deborah Chalk on January 07, 2009 revealing medically manageable coronary artery disease with reduced EF at 30%   . Hernia repair  1998  . Prostate test  June 2013  . Exploration post operative open heart  2007  . Abdominal aortic aneurysm repair  07/16/2012    History  Smoking status  . Former Smoker -- 1.00 packs/day for 47 years  . Types: Cigarettes  . Quit date: 03/12/2003  Smokeless tobacco  . Never Used    History  Alcohol Use No    Family History  Problem Relation Age of Onset  . Heart attack Brother   . Coronary artery disease Brother   . Coronary artery disease Brother   . Heart disease Brother     Review of Systems: The review of systems is per the HPI.  All other systems were reviewed and are negative.  Physical Exam: BP 138/82  Pulse 70  Ht 5' 6.5" (1.689 m)  Wt 179 lb (81.194 kg)  BMI 28.46 kg/m2 Patient is very pleasant and in no acute distress. Has oxygen in place.  Skin is warm and dry. Color is normal.  HEENT is unremarkable.  Normocephalic/atraumatic. PERRL.  Sclera are nonicteric. Neck is supple. No masses. No JVD. Lungs are clear. Cardiac exam shows a regular rate and rhythm. Abdomen is soft. Extremities are without edema. Gait and ROM are intact. No gross neurologic deficits noted.   LABORATORY DATA:  Lab Results  Component Value Date   WBC 12.6* 08/15/2012   HGB 14.3 08/15/2012   HCT 44.0 08/15/2012   PLT 159.0 08/15/2012   GLUCOSE 66* 09/19/2012   CHOL 143 03/12/2011   TRIG 148.0 03/12/2011   HDL 34.00* 03/12/2011   LDLCALC 79 03/12/2011   ALT 20 07/04/2012   AST 26 07/04/2012   NA 144 09/19/2012   K 3.5 09/19/2012   CL 105 09/19/2012   CREATININE 1.4 09/19/2012   BUN 16 09/19/2012   CO2 31 09/19/2012   TSH 3.772 05/29/2011   INR 1.38 07/16/2012   HGBA1C 7.6* 07/17/2012   Assessment / Plan: 1. Ischemic CM - EF was up to 50% a year ago. Managed medically. Seems to be holding his own.   2. Severe pulmonary fibrosis - oxygen dependent. This seems to be his most limiting factor.   3. DM - followed by PCP.   4. Underlying ICD - last checked in December and due again with [name] in March - will get scheduled today.   5. Recent rectal bleed - resolved. He is back on his regular medicines. Seems to be doing ok. No more bleeding noted. He has follow up labs later this week with his PCP.   I will see him back in about 4 months.   Patient is agreeable to this plan and will call if any problems develop in the interim.

## 2012-11-19 NOTE — Patient Instructions (Addendum)
Stay on your current medicines  Let Dr. Lorin Picket check your lab on Friday  We will see you back in 4 months.  [name] needs to see you in March for your next ICD check  Call the [facility] Care office at [phone] if you have any questions, problems or concerns.

## 2012-12-02 ENCOUNTER — Other Ambulatory Visit: Payer: Self-pay | Admitting: Critical Care Medicine

## 2012-12-24 ENCOUNTER — Ambulatory Visit (INDEPENDENT_AMBULATORY_CARE_PROVIDER_SITE_OTHER): Payer: Medicare Other | Admitting: Critical Care Medicine

## 2012-12-24 ENCOUNTER — Encounter: Payer: Self-pay | Admitting: Critical Care Medicine

## 2012-12-24 ENCOUNTER — Ambulatory Visit (INDEPENDENT_AMBULATORY_CARE_PROVIDER_SITE_OTHER)
Admission: RE | Admit: 2012-12-24 | Discharge: 2012-12-24 | Disposition: A | Discharge status: Home / Self Care | Payer: Medicare Other | Source: Ambulatory Visit | Attending: Critical Care Medicine | Admitting: Critical Care Medicine

## 2012-12-24 VITALS — BP 120/78 | HR 78 | Temp 97.4°F | Ht 66.5 in | Wt 181.0 lb

## 2012-12-24 DIAGNOSIS — J8410 Pulmonary fibrosis, unspecified: Secondary | ICD-10-CM

## 2012-12-24 DIAGNOSIS — J9610 Chronic respiratory failure, unspecified whether with hypoxia or hypercapnia: Secondary | ICD-10-CM

## 2012-12-24 MED ORDER — PREDNISONE 10 MG PO TABS: ORAL_TABLET | ORAL | Status: DC

## 2012-12-24 NOTE — Patient Instructions (Addendum)
Prednisone 10mg  Take 4 for three days 3 for three days 2 for three days then one daily An oximiser will be obtained Return 2 months

## 2012-12-24 NOTE — Progress Notes (Signed)
Subjective:    Patient ID: [name], male    DOB: [birth year], 77 y.o.   MRN: [id number]  HPI  77 y.o.WM  Hosp 7/10 -04/15/11 for CHF, resp failure, Ischemic CM 45-50%   COPD/ Pulm Fibrosis  D/c on oxygen.  CT neg for PE,  pulm infiltrates/fibrosis  12/24/2012 Sats 64% on arrival.  Normally runs 6L rest 8L exertion.   Has a meter at home: runs 99-98% Taking less exertion to desat and longer to recover even on 8L  No cough.  No wheezing .  No chest pain. No edema in feet.  Dyspnea is worse. No qhs dyspnea No orthopnea.  No GERD No hemoptysis   Past Medical History  Diagnosis Date  . CAD (coronary artery disease)     s/p CABG; s/p redo CABG in 1997 and 2007; cath 4/10: LM occluded, CFX occluded, old S-RCA occluded, old S-RCA 90%, left radial to RCA ok, S-CFX ok, L-LAD ok, 2 old SVGs stumped  . Hypertension   . Hyperlipidemia   . Ischemic cardiomyopathy     echo 7/12: EF 45-50%, severe LVH, mild LAE; EF 50% per echo 2/13  . Chronic systolic heart failure     EF is 24% in the past; EF 45-50% in 7/12 and up to 50% in Feb 2013  . Carotid stenosis     doppler 11/11: 20-39% bilat.  . ICD (implantable cardiac defibrillator) in place 2010  . Hypertensive heart disease   . COPD (chronic obstructive pulmonary disease)   . History of stroke   . Hiatal hernia   . AAA (abdominal aortic aneurysm)     followed by [facility]  . Pulmonary fibrosis     followed by [name]; on chronic oxygen.   . Prolonged grief reaction   . Diabetes mellitus, insulin dependent (IDDM), controlled   . CHF (congestive heart failure)   . Myocardial infarction   . Stroke   . Shortness of breath     on home O2  . Rectal bleed February 2014     Family History  Problem Relation Age of Onset  . Heart attack Brother   . Coronary artery disease Brother   . Coronary artery disease Brother   . Heart disease Brother      History   Social History  . Marital Status: Widowed    Spouse Name: N/A    Number of  Children: N/A  . Years of Education: N/A   Occupational History  . Not on file.   Social History Main Topics  . Smoking status: Former Smoker -- 1.00 packs/day for 47 years    Types: Cigarettes    Quit date: 03/12/2003  . Smokeless tobacco: Never Used  . Alcohol Use: No  . Drug Use: No  . Sexually Active: No   Other Topics Concern  . Not on file   Social History Narrative  . No narrative on file     Allergies  Allergen Reactions  . Sulfonamide Derivatives Nausea And Vomiting     Outpatient Prescriptions Prior to Visit  Medication Sig Dispense Refill  . albuterol (PROVENTIL) (2.5 MG/3ML) 0.083% nebulizer solution Take 2.5 mg by nebulization daily.       Marland Kitchen aspirin 81 MG tablet Take 81 mg by mouth daily.        Marland Kitchen atorvastatin (LIPITOR) 40 MG tablet Take 1 tablet (40 mg total) by mouth daily.  30 tablet  11  . clopidogrel (PLAVIX) 75 MG tablet TAKE 1 TABLET ONCE  DAILY.  30 tablet  6  . ezetimibe (ZETIA) 10 MG tablet Take 10 mg by mouth daily.      Marland Kitchen FLUoxetine (PROZAC) 40 MG capsule Take 40 mg by mouth daily.      . Fluticasone-Salmeterol (ADVAIR DISKUS) 250-50 MCG/DOSE AEPB Inhale 1 puff into the lungs 2 (two) times daily.  180 each  1  . furosemide (LASIX) 40 MG tablet One a day  30 tablet    . insulin detemir (LEVEMIR) 100 UNIT/ML injection Inject 20 Units into the skin daily. And 23 units if blood sugar is >150      . Insulin Pen Needle (PEN NEEDLES 31GX5/16") 31G X 8 MM MISC       . losartan (COZAAR) 25 MG tablet Take only if blood pressure above 120  90 tablet  3  . Multiple Vitamin (MULTIVITAMIN WITH MINERALS) TABS Take 1 tablet by mouth daily.      . nateglinide (STARLIX) 60 MG tablet Take 60 mg by mouth daily as needed (when eating heavy meals).       . nebivolol (BYSTOLIC) 2.5 MG tablet Take 2.5 mg by mouth daily.      . NON FORMULARY 6 liters rest 8L exertion 24/7      . pantoprazole (PROTONIX) 40 MG tablet Take 1 tablet (40 mg total) by mouth 2 (two) times daily.   60 tablet  5  . SPIRIVA HANDIHALER 18 MCG inhalation capsule PLACE 1 CAPSULE INTO INHALER AND INHALE DAILY.  30 each  6  . predniSONE (DELTASONE) 10 MG tablet TAKE 2 TABLETS DAILY.  60 tablet  2   No facility-administered medications prior to visit.      Review of Systems  Constitutional:   No  weight loss, night sweats,  Fevers, chills, fatigue, lassitude. HEENT:   No headaches,  Difficulty swallowing,  Tooth/dental problems,  Sore throat,                No sneezing, itching, ear ache, nasal congestion, post nasal drip,   CV:  No chest pain,  Orthopnea, PND, swelling in lower extremities, anasarca, dizziness, palpitations  GI  No heartburn, indigestion, abdominal pain, nausea, vomiting, diarrhea, change in bowel habits, loss of appetite  Resp: Notes  shortness of breath with exertion not  at rest.  No excess mucus, no productive cough,  No non-productive cough,  No coughing up of blood.  No change in color of mucus.  No wheezing.  No chest wall deformity  Skin: no rash or lesions.  GU: no dysuria, change in color of urine, no urgency or frequency.  No flank pain.  MS:  No joint pain or swelling.  No decreased range of motion.  No back pain.  Psych:  No change in mood or affect. No depression or anxiety.  No memory loss.     Objective:   Physical Exam  Filed Vitals:   12/24/12 1424 12/24/12 1431  BP:  120/78  Pulse:  78  Temp:  97.4 F (36.3 C)  TempSrc:  Oral  Height:  5' 6.5" (1.689 m)  Weight:  181 lb (82.101 kg)  SpO2: 64% 96%    Gen: Pleasant, well-nourished, in no distress,  normal affect  ENT: No lesions,  mouth clear,  oropharynx clear, no postnasal drip  Neck: No JVD, no TMG, no carotid bruits  Lungs: No use of accessory muscles, no dullness to percussion, distant bs   Cardiovascular: RRR, heart sounds normal, no murmur or gallops, no peripheral  edema  Abdomen: soft and NT, no HSM,  BS normal  Musculoskeletal: No deformities, no cyanosis or  clubbing  Neuro: alert, non focal  Skin: Warm, no lesions or rashes        Assessment & Plan:   Pulmonary fibrosis, postinflammatory Progressive pulmonary fibrosis with associated chronic hypoxemic respiratory failure. No evidence of associated heart failure or increased lung water on this visit 12/24/2012 Plan Prednisone 10mg  Take 4 for three days 3 for three days 2 for three days then one daily An oximiser will be obtained Return 2 months   Chronic respiratory failure Progressive respiratory failure on the basis of progressive pulmonary fibrosis Obtained and Oxymizer for this patient   Note a flu vaccination was given on this visit 12/25/2012  Updated Medication List Outpatient Encounter Prescriptions as of 12/24/2012  Medication Sig Dispense Refill  . albuterol (PROVENTIL) (2.5 MG/3ML) 0.083% nebulizer solution Take 2.5 mg by nebulization daily.       Marland Kitchen aspirin 81 MG tablet Take 81 mg by mouth daily.        Marland Kitchen atorvastatin (LIPITOR) 40 MG tablet Take 1 tablet (40 mg total) by mouth daily.  30 tablet  11  . clopidogrel (PLAVIX) 75 MG tablet TAKE 1 TABLET ONCE DAILY.  30 tablet  6  . ezetimibe (ZETIA) 10 MG tablet Take 10 mg by mouth daily.      Marland Kitchen FLUoxetine (PROZAC) 40 MG capsule Take 40 mg by mouth daily.      . Fluticasone-Salmeterol (ADVAIR DISKUS) 250-50 MCG/DOSE AEPB Inhale 1 puff into the lungs 2 (two) times daily.  180 each  1  . furosemide (LASIX) 40 MG tablet One a day  30 tablet    . insulin detemir (LEVEMIR) 100 UNIT/ML injection Inject 20 Units into the skin daily. And 23 units if blood sugar is >150      . Insulin Pen Needle (PEN NEEDLES 31GX5/16") 31G X 8 MM MISC       . losartan (COZAAR) 25 MG tablet Take only if blood pressure above 120  90 tablet  3  . Multiple Vitamin (MULTIVITAMIN WITH MINERALS) TABS Take 1 tablet by mouth daily.      . nateglinide (STARLIX) 60 MG tablet Take 60 mg by mouth daily as needed (when eating heavy meals).       . nebivolol  (BYSTOLIC) 2.5 MG tablet Take 2.5 mg by mouth daily.      . NON FORMULARY 6 liters rest 8L exertion 24/7      . pantoprazole (PROTONIX) 40 MG tablet Take 1 tablet (40 mg total) by mouth 2 (two) times daily.  60 tablet  5  . predniSONE (DELTASONE) 10 MG tablet Take 4 for three days 3 for three days 2 for three days 1 for three days and stop  30 tablet  0  . SPIRIVA HANDIHALER 18 MCG inhalation capsule PLACE 1 CAPSULE INTO INHALER AND INHALE DAILY.  30 each  6  . [DISCONTINUED] predniSONE (DELTASONE) 10 MG tablet TAKE 2 TABLETS DAILY.  60 tablet  2  . [DISCONTINUED] predniSONE (DELTASONE) 10 MG tablet        No facility-administered encounter medications on file as of 12/24/2012.

## 2012-12-25 DIAGNOSIS — J9610 Chronic respiratory failure, unspecified whether with hypoxia or hypercapnia: Secondary | ICD-10-CM | POA: Insufficient documentation

## 2012-12-25 NOTE — Assessment & Plan Note (Signed)
Progressive pulmonary fibrosis with associated chronic hypoxemic respiratory failure. No evidence of associated heart failure or increased lung water on this visit 12/24/2012 Plan Prednisone 10mg  Take 4 for three days 3 for three days 2 for three days then one daily An oximiser will be obtained Return 2 months

## 2012-12-25 NOTE — Assessment & Plan Note (Signed)
Progressive respiratory failure on the basis of progressive pulmonary fibrosis Obtained and Oxymizer for this patient

## 2012-12-31 ENCOUNTER — Ambulatory Visit (INDEPENDENT_AMBULATORY_CARE_PROVIDER_SITE_OTHER): Payer: Medicare Other | Admitting: Internal Medicine

## 2012-12-31 ENCOUNTER — Encounter: Payer: Self-pay | Admitting: Internal Medicine

## 2012-12-31 ENCOUNTER — Telehealth: Payer: Self-pay | Admitting: *Deleted

## 2012-12-31 VITALS — BP 129/81 | HR 74 | Ht 66.0 in | Wt 181.0 lb

## 2012-12-31 DIAGNOSIS — I5032 Chronic diastolic (congestive) heart failure: Secondary | ICD-10-CM

## 2012-12-31 DIAGNOSIS — I4891 Unspecified atrial fibrillation: Secondary | ICD-10-CM

## 2012-12-31 DIAGNOSIS — I2589 Other forms of chronic ischemic heart disease: Secondary | ICD-10-CM

## 2012-12-31 LAB — ICD DEVICE OBSERVATION
AL AMPLITUDE: 2.625 mv
AL IMPEDENCE ICD: 532 Ohm
ATRIAL PACING ICD: 67.64 pct
RV LEAD IMPEDENCE ICD: 551 Ohm
TOT-0001: 0
TOT-0002: 0
TOT-0006: 20100503000000
TZAT-0001ATACH: 3
TZAT-0001FASTVT: 1
TZAT-0002FASTVT: NEGATIVE
TZAT-0012ATACH: 150 ms
TZAT-0012ATACH: 150 ms
TZAT-0012ATACH: 150 ms
TZAT-0012FASTVT: 200 ms
TZAT-0012SLOWVT: 200 ms
TZAT-0012SLOWVT: 200 ms
TZAT-0013SLOWVT: 2
TZAT-0019ATACH: 6 V
TZAT-0019SLOWVT: 8 V
TZAT-0019SLOWVT: 8 V
TZAT-0020ATACH: 1.5 ms
TZAT-0020ATACH: 1.5 ms
TZAT-0020SLOWVT: 1.5 ms
TZAT-0020SLOWVT: 1.5 ms
TZON-0003SLOWVT: 340 ms
TZON-0003VSLOWVT: 400 ms
TZST-0001ATACH: 4
TZST-0001ATACH: 5
TZST-0001ATACH: 6
TZST-0001FASTVT: 2
TZST-0001FASTVT: 4
TZST-0001FASTVT: 5
TZST-0001SLOWVT: 5
TZST-0002ATACH: NEGATIVE
TZST-0002FASTVT: NEGATIVE
TZST-0002FASTVT: NEGATIVE
TZST-0002FASTVT: NEGATIVE
TZST-0003SLOWVT: 35 J
TZST-0003SLOWVT: 35 J

## 2012-12-31 LAB — BASIC METABOLIC PANEL
BUN: 30 mg/dL — ABNORMAL HIGH (ref 6–23)
Calcium: 8.8 mg/dL (ref 8.4–10.5)
GFR: 42.84 mL/min — ABNORMAL LOW (ref >60.00)
Potassium: 3.4 mEq/L — ABNORMAL LOW (ref 3.5–5.1)

## 2012-12-31 MED ORDER — POTASSIUM CHLORIDE CRYS ER 20 MEQ PO TBCR: 20.0000 meq | EXTENDED_RELEASE_TABLET | Freq: Every day | ORAL | Status: DC

## 2012-12-31 NOTE — Assessment & Plan Note (Signed)
Continue current meds 

## 2012-12-31 NOTE — Telephone Encounter (Signed)
SEE LAB FROM TODAY/ ORDERS FOLLOWED. K+ 20 MEQ DAILY FOR 7 DAYS // ALREADY SENT TO PHARMACY NEEDS 2 WEEK LAB DATE TO RECHECK K+ NEEDS 3 WEEK F/U APP WITH SCOTT WEAVER PA-C ASKED PT TO CALL BACK AND ASK FOR TRIAGE.

## 2012-12-31 NOTE — Assessment & Plan Note (Signed)
optivol index in response to Lasix both suggest excess fluid. This may be partly related to prednisone but the duration is much greater than that. I will have him increase his Lasix from 40 daily to 40 twice a day alternating with 40 daily

## 2012-12-31 NOTE — Assessment & Plan Note (Signed)
Brief episodes were detected on his device. Cycle length was 200 ms range. The longest duration was 6 hours. We will need to keep an eye on this and consider anticoagulation.

## 2012-12-31 NOTE — Assessment & Plan Note (Signed)
The patient's device was interrogated.  The information was reviewed. No changes were made in the programming.    

## 2012-12-31 NOTE — Progress Notes (Signed)
Patient Care Team: Heywood Bene as PCP - General (Unknown Physician Specialty) Storm Frisk, MD (Pulmonary Disease) Duke Salvia, MD as Attending Physician (Cardiology)   HPI  [name] is a 77 y.o. male is seen in followup for ICD implantation in the setting of ischemic heart disease and prior bypass surgery with depressed left ventricular function with ejection fraction of 25%. Most recent echo July 2012 demonstrating 45-50% EF   He had an episode recently where he became very short of breath. He related to his having run out of oxygen on and dry. He's had some problems with swelling. He took an extra Lasix and that helped also. Most recent metabolic profile was 12/13 creatinine 1.4 potassium 3.5     Past Medical History  Diagnosis Date  . CAD (coronary artery disease)     s/p CABG; s/p redo CABG in 1997 and 2007; cath 4/10: LM occluded, CFX occluded, old S-RCA occluded, old S-RCA 90%, left radial to RCA ok, S-CFX ok, L-LAD ok, 2 old SVGs stumped  . Hypertension   . Hyperlipidemia   . Ischemic cardiomyopathy     echo 7/12: EF 45-50%, severe LVH, mild LAE; EF 50% per echo 2/13  . Chronic systolic heart failure     EF is 24% in the past; EF 45-50% in 7/12 and up to 50% in Feb 2013  . Carotid stenosis     doppler 11/11: 20-39% bilat.  . ICD (implantable cardiac defibrillator) in place 2010  . Hypertensive heart disease   . COPD (chronic obstructive pulmonary disease)   . History of stroke   . Hiatal hernia   . AAA (abdominal aortic aneurysm)     followed by [facility]  . Pulmonary fibrosis     followed by [name]; on chronic oxygen.   . Prolonged grief reaction   . Diabetes mellitus, insulin dependent (IDDM), controlled   . CHF (congestive heart failure)   . Myocardial infarction   . Stroke   . Shortness of breath     on home O2  . Rectal bleed February 2014    Past Surgical History  Procedure Laterality Date  . Coronary artery bypass graft  2007    redo  CABG x2 -- at that time had left radial artery graft to the distal right coronary and saphenous vein graft to the distal circumflex.   . Coronary artery bypass graft  1997    redo surgery x2 --  in January 18, 1996, and at that time had a vein graft to the diagonal and OM and a vein graft to the posterior descending and  posterolateral branches   . Coronary artery bypass graft  1992    x2 -- left internal mammary to the LAD, a single vein graft to the diagonal - obtuse marginal system  and posterior descending  . Insert / replace / remove pacemaker  01/29/2009    [employer] Virtuoso II DR four chamber AICD  . Cardiac catheterization  01/07/2009     performed by Dr. Deborah Chalk on January 07, 2009 revealing medically manageable coronary artery disease with reduced EF at 30%   . Hernia repair  1998  . Prostate test  June 2013  . Exploration post operative open heart  2007  . Abdominal aortic aneurysm repair  07/16/2012    Current Outpatient Prescriptions  Medication Sig Dispense Refill  . albuterol (PROVENTIL) (2.5 MG/3ML) 0.083% nebulizer solution Take 2.5 mg by nebulization daily.       Marland Kitchen  aspirin 81 MG tablet Take 81 mg by mouth daily.        Marland Kitchen atorvastatin (LIPITOR) 40 MG tablet Take 1 tablet (40 mg total) by mouth daily.  30 tablet  11  . clopidogrel (PLAVIX) 75 MG tablet TAKE 1 TABLET ONCE DAILY.  30 tablet  6  . ezetimibe (ZETIA) 10 MG tablet Take 10 mg by mouth daily.      Marland Kitchen FLUoxetine (PROZAC) 40 MG capsule Take 40 mg by mouth daily.      . Fluticasone-Salmeterol (ADVAIR DISKUS) 250-50 MCG/DOSE AEPB Inhale 1 puff into the lungs 2 (two) times daily.  180 each  1  . furosemide (LASIX) 40 MG tablet One a day  30 tablet    . insulin detemir (LEVEMIR) 100 UNIT/ML injection Inject 20 Units into the skin daily. And 23 units if blood sugar is >150      . Insulin Pen Needle (PEN NEEDLES 31GX5/16") 31G X 8 MM MISC       . losartan (COZAAR) 25 MG tablet Take only if blood pressure above 120  90 tablet  3   . Multiple Vitamin (MULTIVITAMIN WITH MINERALS) TABS Take 1 tablet by mouth daily.      . nateglinide (STARLIX) 60 MG tablet Take 60 mg by mouth daily as needed (when eating heavy meals).       . nebivolol (BYSTOLIC) 2.5 MG tablet Take 2.5 mg by mouth daily.      . NON FORMULARY 6 liters rest 8L exertion 24/7      . pantoprazole (PROTONIX) 40 MG tablet Take 1 tablet (40 mg total) by mouth 2 (two) times daily.  60 tablet  5  . predniSONE (DELTASONE) 10 MG tablet Take 4 for three days 3 for three days 2 for three days 1 for three days and stop  30 tablet  0  . SPIRIVA HANDIHALER 18 MCG inhalation capsule PLACE 1 CAPSULE INTO INHALER AND INHALE DAILY.  30 each  6   No current facility-administered medications for this visit.    Allergies  Allergen Reactions  . Sulfonamide Derivatives Nausea And Vomiting    Review of Systems negative except from HPI and PMH  Physical Exam BP 129/81  Pulse 74  Ht 5\' 6"  (1.676 m)  Wt 181 lb (82.101 kg)  BMI 29.23 kg/m2  SpO2 94% Well developed and well nourished in no acute distress HENT normal E scleral and icterus clear Neck Supple JVP flat; carotids brisk and full Clear to ausculation  Regular rate and rhythm, no murmurs gallops or rub Soft with active bowel sounds No clubbing cyanosis 1+ Edema Alert and oriented, grossly normal motor and sensory function Skin Warm and Dry    Assessment and  Plan

## 2012-12-31 NOTE — Patient Instructions (Addendum)
Your physician wants you to follow-up in: 1 year.   You will receive a reminder letter in the mail two months in advance. If you don't receive a letter, please call our office to schedule the follow-up appointment.  Your physician has recommended you make the following change in your medication: INCREASE your lasix to twice daily alternating with daily every other day for 10 days  Your physician recommends that you return for lab work in: today (bmet)  Your device is scheduled to transmit on 04/06/13

## 2013-01-01 NOTE — Telephone Encounter (Signed)
I left a message for the patient to call. 

## 2013-01-01 NOTE — Telephone Encounter (Signed)
I spoke with the patient. He is aware of Dr. Odessa Fleming recommendations for potassium. He will follow up with Norma Fredrickson, NP on 4/21. Will repeat his bmp same day as he is coming from [place]. The patient voices understanding.

## 2013-01-06 ENCOUNTER — Other Ambulatory Visit: Payer: Self-pay | Admitting: Critical Care Medicine

## 2013-01-07 NOTE — Telephone Encounter (Signed)
LMTCB to find out why the patient is needing this refilled.

## 2013-01-09 ENCOUNTER — Telehealth: Payer: Self-pay | Admitting: Critical Care Medicine

## 2013-01-09 DIAGNOSIS — J8410 Pulmonary fibrosis, unspecified: Secondary | ICD-10-CM

## 2013-01-09 MED ORDER — PREDNISONE 10 MG PO TABS: ORAL_TABLET | ORAL | Status: DC

## 2013-01-09 NOTE — Telephone Encounter (Signed)
Spoke with Bonita Quin at pharmacy, states patient showed up at pharmacy requesting prednisone refill. [name] can you please clarify if patient is to remain on prednisone 10mg  daily. Thank You!  Per OV note 12/24/12: Patient Instructions    Prednisone 10mg  Take 4 for three days 3 for three days 2 for three days then one daily  An oximiser will be obtained  Return 2 months    Medication was sent in to pharmacy 12/24/12: 10mg  Take 4 for three days 3 for three days 2 for three days then 1 for three days and STOP.  #30 0 refills   [facility]

## 2013-01-09 NOTE — Telephone Encounter (Signed)
RX has been sent.

## 2013-01-09 NOTE — Telephone Encounter (Signed)
Yes pred 10mg /d

## 2013-01-14 ENCOUNTER — Inpatient Hospital Stay (HOSPITAL_COMMUNITY): Payer: Medicare Other

## 2013-01-14 ENCOUNTER — Inpatient Hospital Stay (HOSPITAL_COMMUNITY)
Admission: AD | Admit: 2013-01-14 | Discharge: 2013-01-21 | DRG: 291 | Disposition: A | Discharge status: Home / Self Care | Payer: Medicare Other | Source: Other Acute Inpatient Hospital | Attending: Internal Medicine | Admitting: Internal Medicine

## 2013-01-14 ENCOUNTER — Inpatient Hospital Stay
Admission: EM | Admit: 2013-01-14 | Payer: Self-pay | Source: Other Acute Inpatient Hospital | Admitting: Internal Medicine

## 2013-01-14 ENCOUNTER — Encounter (HOSPITAL_COMMUNITY): Payer: Self-pay | Admitting: Internal Medicine

## 2013-01-14 DIAGNOSIS — N179 Acute kidney failure, unspecified: Secondary | ICD-10-CM | POA: Diagnosis present

## 2013-01-14 DIAGNOSIS — I2589 Other forms of chronic ischemic heart disease: Secondary | ICD-10-CM | POA: Diagnosis present

## 2013-01-14 DIAGNOSIS — E861 Hypovolemia: Secondary | ICD-10-CM | POA: Diagnosis present

## 2013-01-14 DIAGNOSIS — I248 Other forms of acute ischemic heart disease: Secondary | ICD-10-CM | POA: Diagnosis present

## 2013-01-14 DIAGNOSIS — E1151 Type 2 diabetes mellitus with diabetic peripheral angiopathy without gangrene: Secondary | ICD-10-CM | POA: Diagnosis present

## 2013-01-14 DIAGNOSIS — E785 Hyperlipidemia, unspecified: Secondary | ICD-10-CM | POA: Diagnosis present

## 2013-01-14 DIAGNOSIS — Z8673 Personal history of transient ischemic attack (TIA), and cerebral infarction without residual deficits: Secondary | ICD-10-CM

## 2013-01-14 DIAGNOSIS — J439 Emphysema, unspecified: Secondary | ICD-10-CM

## 2013-01-14 DIAGNOSIS — J438 Other emphysema: Secondary | ICD-10-CM | POA: Diagnosis present

## 2013-01-14 DIAGNOSIS — I5032 Chronic diastolic (congestive) heart failure: Secondary | ICD-10-CM

## 2013-01-14 DIAGNOSIS — Z7982 Long term (current) use of aspirin: Secondary | ICD-10-CM

## 2013-01-14 DIAGNOSIS — I509 Heart failure, unspecified: Secondary | ICD-10-CM | POA: Diagnosis present

## 2013-01-14 DIAGNOSIS — Z95810 Presence of automatic (implantable) cardiac defibrillator: Secondary | ICD-10-CM | POA: Diagnosis present

## 2013-01-14 DIAGNOSIS — J9620 Acute and chronic respiratory failure, unspecified whether with hypoxia or hypercapnia: Secondary | ICD-10-CM | POA: Diagnosis present

## 2013-01-14 DIAGNOSIS — J9611 Chronic respiratory failure with hypoxia: Secondary | ICD-10-CM | POA: Diagnosis present

## 2013-01-14 DIAGNOSIS — I2510 Atherosclerotic heart disease of native coronary artery without angina pectoris: Secondary | ICD-10-CM | POA: Diagnosis present

## 2013-01-14 DIAGNOSIS — I129 Hypertensive chronic kidney disease with stage 1 through stage 4 chronic kidney disease, or unspecified chronic kidney disease: Secondary | ICD-10-CM | POA: Diagnosis present

## 2013-01-14 DIAGNOSIS — J449 Chronic obstructive pulmonary disease, unspecified: Secondary | ICD-10-CM | POA: Diagnosis present

## 2013-01-14 DIAGNOSIS — I25810 Atherosclerosis of coronary artery bypass graft(s) without angina pectoris: Secondary | ICD-10-CM | POA: Diagnosis present

## 2013-01-14 DIAGNOSIS — J441 Chronic obstructive pulmonary disease with (acute) exacerbation: Secondary | ICD-10-CM | POA: Diagnosis present

## 2013-01-14 DIAGNOSIS — I130 Hypertensive heart and chronic kidney disease with heart failure and stage 1 through stage 4 chronic kidney disease, or unspecified chronic kidney disease: Secondary | ICD-10-CM | POA: Diagnosis present

## 2013-01-14 DIAGNOSIS — E876 Hypokalemia: Secondary | ICD-10-CM | POA: Diagnosis present

## 2013-01-14 DIAGNOSIS — J8410 Pulmonary fibrosis, unspecified: Secondary | ICD-10-CM | POA: Diagnosis present

## 2013-01-14 DIAGNOSIS — E119 Type 2 diabetes mellitus without complications: Secondary | ICD-10-CM | POA: Diagnosis present

## 2013-01-14 DIAGNOSIS — I4891 Unspecified atrial fibrillation: Secondary | ICD-10-CM | POA: Diagnosis present

## 2013-01-14 DIAGNOSIS — E875 Hyperkalemia: Secondary | ICD-10-CM | POA: Diagnosis present

## 2013-01-14 DIAGNOSIS — D72829 Elevated white blood cell count, unspecified: Secondary | ICD-10-CM | POA: Diagnosis present

## 2013-01-14 DIAGNOSIS — Z9981 Dependence on supplemental oxygen: Secondary | ICD-10-CM

## 2013-01-14 DIAGNOSIS — I5042 Chronic combined systolic (congestive) and diastolic (congestive) heart failure: Secondary | ICD-10-CM | POA: Diagnosis present

## 2013-01-14 DIAGNOSIS — J159 Unspecified bacterial pneumonia: Secondary | ICD-10-CM | POA: Diagnosis present

## 2013-01-14 DIAGNOSIS — J4489 Other specified chronic obstructive pulmonary disease: Secondary | ICD-10-CM | POA: Diagnosis present

## 2013-01-14 DIAGNOSIS — I10 Essential (primary) hypertension: Secondary | ICD-10-CM | POA: Diagnosis present

## 2013-01-14 DIAGNOSIS — I252 Old myocardial infarction: Secondary | ICD-10-CM

## 2013-01-14 DIAGNOSIS — N183 Chronic kidney disease, stage 3 unspecified: Secondary | ICD-10-CM | POA: Diagnosis present

## 2013-01-14 DIAGNOSIS — J9621 Acute and chronic respiratory failure with hypoxia: Secondary | ICD-10-CM

## 2013-01-14 DIAGNOSIS — I2489 Other forms of acute ischemic heart disease: Secondary | ICD-10-CM | POA: Diagnosis present

## 2013-01-14 DIAGNOSIS — J189 Pneumonia, unspecified organism: Secondary | ICD-10-CM | POA: Diagnosis present

## 2013-01-14 DIAGNOSIS — I5041 Acute combined systolic (congestive) and diastolic (congestive) heart failure: Principal | ICD-10-CM | POA: Diagnosis present

## 2013-01-14 LAB — HIV ANTIBODY (ROUTINE TESTING W REFLEX): HIV: NONREACTIVE

## 2013-01-14 LAB — CREATININE, SERUM
GFR calc Af Amer: 41 mL/min — ABNORMAL LOW (ref >90)
GFR calc non Af Amer: 35 mL/min — ABNORMAL LOW (ref >90)

## 2013-01-14 LAB — TROPONIN I
Troponin I: 0.88 ng/mL (ref <0.30)
Troponin I: 0.59 ng/mL (ref <0.30)
Troponin I: 0.32 ng/mL (ref <0.30)

## 2013-01-14 LAB — PRO B NATRIURETIC PEPTIDE: Pro B Natriuretic peptide (BNP): 1684 pg/mL — ABNORMAL HIGH (ref 0–450)

## 2013-01-14 LAB — CBC
HCT: 38.8 % — ABNORMAL LOW (ref 39.0–52.0)
Hemoglobin: 12.8 g/dL — ABNORMAL LOW (ref 13.0–17.0)
RBC: 4.33 MIL/uL (ref 4.22–5.81)
WBC: 12 10*3/uL — ABNORMAL HIGH (ref 4.0–10.5)

## 2013-01-14 LAB — MAGNESIUM: Magnesium: 2.1 mg/dL (ref 1.5–2.5)

## 2013-01-14 LAB — BLOOD GAS, ARTERIAL
Acid-Base Excess: 2.6 mmol/L — ABNORMAL HIGH (ref 0.0–2.0)
Bicarbonate: 27 mEq/L — ABNORMAL HIGH (ref 20.0–24.0)
O2 Content: 7 L/min
O2 Saturation: 96.9 %
Patient temperature: 98.6

## 2013-01-14 LAB — STREP PNEUMONIAE URINARY ANTIGEN: Strep Pneumo Urinary Antigen: NEGATIVE

## 2013-01-14 LAB — GLUCOSE, CAPILLARY
Glucose-Capillary: 134 mg/dL — ABNORMAL HIGH (ref 70–99)
Glucose-Capillary: 140 mg/dL — ABNORMAL HIGH (ref 70–99)

## 2013-01-14 LAB — MRSA PCR SCREENING: MRSA by PCR: NEGATIVE

## 2013-01-14 MED ORDER — HEPARIN SODIUM (PORCINE) 5000 UNIT/ML IJ SOLN
5000.0000 [IU] | Freq: Three times a day (TID) | INTRAMUSCULAR | Status: DC
Start: 2013-01-14 — End: 2013-01-14
  Filled 2013-01-14 (×3): qty 1

## 2013-01-14 MED ORDER — ASPIRIN 81 MG PO TABS: 81.0000 mg | ORAL_TABLET | Freq: Every day | ORAL | Status: DC

## 2013-01-14 MED ORDER — ALBUTEROL SULFATE (5 MG/ML) 0.5% IN NEBU: 2.5000 mg | INHALATION_SOLUTION | RESPIRATORY_TRACT | Status: DC | PRN

## 2013-01-14 MED ORDER — TIOTROPIUM BROMIDE MONOHYDRATE 18 MCG IN CAPS
18.0000 ug | ORAL_CAPSULE | Freq: Every day | RESPIRATORY_TRACT | Status: DC
  Administered 2013-01-14 – 2013-01-21 (×7): 18 ug via RESPIRATORY_TRACT
  Filled 2013-01-14 (×2): qty 5

## 2013-01-14 MED ORDER — HEPARIN SODIUM (PORCINE) 5000 UNIT/ML IJ SOLN
5000.0000 [IU] | Freq: Three times a day (TID) | INTRAMUSCULAR | Status: DC
  Administered 2013-01-14 – 2013-01-21 (×20): 5000 [IU] via SUBCUTANEOUS
  Filled 2013-01-14 (×24): qty 1

## 2013-01-14 MED ORDER — SODIUM CHLORIDE 0.9 % IJ SOLN
3.0000 mL | Freq: Two times a day (BID) | INTRAMUSCULAR | Status: DC
  Administered 2013-01-14: 3 mL via INTRAVENOUS

## 2013-01-14 MED ORDER — HYDROCODONE-ACETAMINOPHEN 5-325 MG PO TABS
1.0000 | ORAL_TABLET | ORAL | Status: DC | PRN
  Administered 2013-01-14: 2 via ORAL
  Administered 2013-01-14 (×2): 1 via ORAL
  Administered 2013-01-14: 2 via ORAL
  Filled 2013-01-14: qty 2
  Filled 2013-01-14: qty 1
  Filled 2013-01-14 (×2): qty 2
  Filled 2013-01-14: qty 1

## 2013-01-14 MED ORDER — INSULIN ASPART 100 UNIT/ML ~~LOC~~ SOLN
3.0000 [IU] | Freq: Three times a day (TID) | SUBCUTANEOUS | Status: DC
  Administered 2013-01-14 – 2013-01-17 (×5): 3 [IU] via SUBCUTANEOUS
  Administered 2013-01-17: 08:00:00 via SUBCUTANEOUS
  Administered 2013-01-18: 3 [IU] via SUBCUTANEOUS

## 2013-01-14 MED ORDER — ASPIRIN EC 81 MG PO TBEC
81.0000 mg | DELAYED_RELEASE_TABLET | Freq: Every day | ORAL | Status: DC
  Administered 2013-01-14 – 2013-01-21 (×8): 81 mg via ORAL
  Filled 2013-01-14 (×8): qty 1

## 2013-01-14 MED ORDER — SODIUM CHLORIDE 0.9 % IV BOLUS (SEPSIS): 250.0000 mL | Freq: Once | INTRAVENOUS | Status: DC

## 2013-01-14 MED ORDER — SODIUM CHLORIDE 0.9 % IJ SOLN: 3.0000 mL | INTRAMUSCULAR | Status: DC | PRN

## 2013-01-14 MED ORDER — DEXTROSE 5 % IV SOLN
1.0000 g | INTRAVENOUS | Status: DC
  Administered 2013-01-14: 1 g via INTRAVENOUS
  Filled 2013-01-14 (×2): qty 10

## 2013-01-14 MED ORDER — INSULIN DETEMIR 100 UNIT/ML ~~LOC~~ SOLN
20.0000 [IU] | Freq: Every day | SUBCUTANEOUS | Status: DC
  Administered 2013-01-14 – 2013-01-18 (×4): 20 [IU] via SUBCUTANEOUS
  Filled 2013-01-14 (×5): qty 0.2

## 2013-01-14 MED ORDER — AZITHROMYCIN 500 MG PO TABS
500.0000 mg | ORAL_TABLET | ORAL | Status: DC
  Administered 2013-01-14: 500 mg via ORAL
  Filled 2013-01-14 (×2): qty 1

## 2013-01-14 MED ORDER — SODIUM CHLORIDE 0.9 % IJ SOLN
3.0000 mL | Freq: Two times a day (BID) | INTRAMUSCULAR | Status: DC
  Administered 2013-01-14 – 2013-01-20 (×13): 3 mL via INTRAVENOUS

## 2013-01-14 MED ORDER — ACETAMINOPHEN 325 MG PO TABS: 650.0000 mg | ORAL_TABLET | Freq: Four times a day (QID) | ORAL | Status: DC | PRN

## 2013-01-14 MED ORDER — POTASSIUM CHLORIDE CRYS ER 20 MEQ PO TBCR
20.0000 meq | EXTENDED_RELEASE_TABLET | Freq: Every day | ORAL | Status: DC
  Administered 2013-01-14 – 2013-01-18 (×5): 20 meq via ORAL
  Filled 2013-01-14: qty 1
  Filled 2013-01-14: qty 2
  Filled 2013-01-14 (×4): qty 1

## 2013-01-14 MED ORDER — PREDNISONE 10 MG PO TABS
10.0000 mg | ORAL_TABLET | Freq: Every day | ORAL | Status: DC
  Administered 2013-01-15 – 2013-01-21 (×7): 10 mg via ORAL
  Filled 2013-01-14 (×8): qty 1

## 2013-01-14 MED ORDER — FLUOXETINE HCL 20 MG PO CAPS
40.0000 mg | ORAL_CAPSULE | Freq: Every day | ORAL | Status: DC
  Administered 2013-01-14 – 2013-01-21 (×8): 40 mg via ORAL
  Filled 2013-01-14 (×8): qty 2

## 2013-01-14 MED ORDER — ONDANSETRON HCL 4 MG PO TABS: 4.0000 mg | ORAL_TABLET | Freq: Four times a day (QID) | ORAL | Status: DC | PRN

## 2013-01-14 MED ORDER — ACETAMINOPHEN 650 MG RE SUPP: 650.0000 mg | Freq: Four times a day (QID) | RECTAL | Status: DC | PRN

## 2013-01-14 MED ORDER — ATORVASTATIN CALCIUM 40 MG PO TABS
40.0000 mg | ORAL_TABLET | Freq: Every day | ORAL | Status: DC
  Administered 2013-01-14 – 2013-01-20 (×7): 40 mg via ORAL
  Filled 2013-01-14 (×8): qty 1

## 2013-01-14 MED ORDER — SODIUM CHLORIDE 0.9 % IV SOLN
INTRAVENOUS | Status: AC
  Administered 2013-01-14 (×2): via INTRAVENOUS

## 2013-01-14 MED ORDER — SODIUM CHLORIDE 0.9 % IV SOLN: 250.0000 mL | INTRAVENOUS | Status: DC | PRN

## 2013-01-14 MED ORDER — PANTOPRAZOLE SODIUM 40 MG PO TBEC
40.0000 mg | DELAYED_RELEASE_TABLET | Freq: Two times a day (BID) | ORAL | Status: DC
  Administered 2013-01-14 – 2013-01-21 (×15): 40 mg via ORAL
  Filled 2013-01-14 (×14): qty 1

## 2013-01-14 MED ORDER — CLOPIDOGREL BISULFATE 75 MG PO TABS
75.0000 mg | ORAL_TABLET | Freq: Once | ORAL | Status: DC
  Administered 2013-01-14: 75 mg via ORAL
  Filled 2013-01-14: qty 1

## 2013-01-14 MED ORDER — NEBIVOLOL HCL 2.5 MG PO TABS
2.5000 mg | ORAL_TABLET | Freq: Every day | ORAL | Status: DC
  Administered 2013-01-14 – 2013-01-15 (×2): 2.5 mg via ORAL
  Filled 2013-01-14 (×2): qty 1

## 2013-01-14 MED ORDER — ONDANSETRON HCL 4 MG/2ML IJ SOLN: 4.0000 mg | Freq: Four times a day (QID) | INTRAMUSCULAR | Status: DC | PRN

## 2013-01-14 MED ORDER — POLYETHYLENE GLYCOL 3350 17 G PO PACK
17.0000 g | PACK | Freq: Every day | ORAL | Status: DC | PRN
  Filled 2013-01-14: qty 1

## 2013-01-14 MED ORDER — FUROSEMIDE 10 MG/ML IJ SOLN
60.0000 mg | Freq: Two times a day (BID) | INTRAMUSCULAR | Status: DC
  Administered 2013-01-14: 60 mg via INTRAVENOUS

## 2013-01-14 MED ORDER — FLUOXETINE HCL 40 MG PO CAPS: 40.0000 mg | ORAL_CAPSULE | Freq: Every day | ORAL | Status: DC

## 2013-01-14 MED ORDER — MOMETASONE FURO-FORMOTEROL FUM 100-5 MCG/ACT IN AERO
2.0000 | INHALATION_SPRAY | Freq: Two times a day (BID) | RESPIRATORY_TRACT | Status: DC
  Administered 2013-01-14 – 2013-01-21 (×15): 2 via RESPIRATORY_TRACT
  Filled 2013-01-14 (×2): qty 8.8

## 2013-01-14 MED ORDER — INSULIN ASPART 100 UNIT/ML ~~LOC~~ SOLN
0.0000 [IU] | Freq: Three times a day (TID) | SUBCUTANEOUS | Status: DC
  Administered 2013-01-14 – 2013-01-17 (×2): 2 [IU] via SUBCUTANEOUS
  Administered 2013-01-17: 3 [IU] via SUBCUTANEOUS
  Administered 2013-01-18 (×2): 2 [IU] via SUBCUTANEOUS
  Administered 2013-01-19 (×2): 3 [IU] via SUBCUTANEOUS
  Administered 2013-01-20 (×2): 2 [IU] via SUBCUTANEOUS

## 2013-01-14 MED ORDER — ADULT MULTIVITAMIN W/MINERALS CH
1.0000 | ORAL_TABLET | Freq: Every day | ORAL | Status: DC
  Administered 2013-01-14 – 2013-01-21 (×8): 1 via ORAL
  Filled 2013-01-14 (×8): qty 1

## 2013-01-14 MED ORDER — POTASSIUM CHLORIDE CRYS ER 20 MEQ PO TBCR
40.0000 meq | EXTENDED_RELEASE_TABLET | Freq: Two times a day (BID) | ORAL | Status: AC
  Administered 2013-01-14: 40 meq via ORAL
  Administered 2013-01-14: 20 meq via ORAL
  Filled 2013-01-14: qty 2

## 2013-01-14 MED ORDER — SODIUM CHLORIDE 0.9 % IV BOLUS (SEPSIS)
500.0000 mL | Freq: Once | INTRAVENOUS | Status: AC
  Administered 2013-01-14: 15:00:00 via INTRAVENOUS

## 2013-01-14 MED ORDER — EZETIMIBE 10 MG PO TABS
10.0000 mg | ORAL_TABLET | Freq: Every day | ORAL | Status: DC
  Administered 2013-01-14 – 2013-01-21 (×8): 10 mg via ORAL
  Filled 2013-01-14 (×8): qty 1

## 2013-01-14 NOTE — H&P (Addendum)
[facility] History and Physical  [name]:[id number] DOB: 1935-12-07 DOA: 01/14/2013  Referring physician: None PCP: Lucila [name], MD  Specialists: none  Chief Complaint: Dyspnea  HPI: [name] is a 77 y.o. male  Past medical history of COPD oxygen dependent, combined diastolic and systolic heart failure with an ejection fraction of 40% status post dual-chamber ICD recently seen by [name] and 4 2014 at that time he was found to be volume overloaded his baseline creatinine was 1.4 he Lasix was increased to twice a day, he relates he was feeling fine until one day prior to admission when he started getting short of breath so much that he needed to increase delivery to his oxygen to 5L. progressively productive cough He was taken to [facility] where a CT scan of the chest with contrast was done which was negative for PE and showed a new infiltrate on Right midlung, an ABG was done that showed an partial pressure of oxygen of less than 50 he was put on a nonrebreather and his saturations improved. Also a CBC was done was found to have a white count of 15 with a left shift. Patient requested to be transferred to [facility] as all his care has been done here.  He relates no fever, chills dyspnea no CP on exertion is positive. No lower extremity edema. He relates no PND or orthopnea. No sick contacts. I have no EKG to interpret.  Review of Systems: The patient denies anorexia, fever, weight loss,, vision loss, decreased hearing, hoarseness, chest pain, syncope, , peripheral edema, balance deficits, hemoptysis, abdominal pain, melena, hematochezia, severe indigestion/heartburn, hematuria, incontinence, genital sores, muscle weakness, suspicious skin lesions, transient blindness, difficulty walking, depression, unusual weight change, abnormal bleeding, enlarged lymph nodes, angioedema, and breast masses.    Past Medical History  Diagnosis Date  . CAD (coronary  artery disease)     s/p CABG; s/p redo CABG in 1997 and 2007; cath 4/10: LM occluded, CFX occluded, old S-RCA occluded, old S-RCA 90%, left radial to RCA ok, S-CFX ok, L-LAD ok, 2 old SVGs stumped  . Hypertension   . Hyperlipidemia   . Ischemic cardiomyopathy     echo 7/12: EF 45-50%, severe LVH, mild LAE; EF 50% per echo 2/13  . Chronic systolic heart failure     EF is 24% in the past; EF 45-50% in 7/12 and up to 50% in Feb 2013  . Carotid stenosis     doppler 11/11: 20-39% bilat.  . ICD (implantable cardiac defibrillator) in place 2010  . Hypertensive heart disease   . COPD (chronic obstructive pulmonary disease)   . History of stroke   . Hiatal hernia   . AAA (abdominal aortic aneurysm)     followed by [facility]  . Pulmonary fibrosis     followed by [name]; on chronic oxygen.   . Prolonged grief reaction   . Diabetes mellitus, insulin dependent (IDDM), controlled   . CHF (congestive heart failure)   . Myocardial infarction   . Stroke   . Shortness of breath     on home O2  . Rectal bleed February 2014   Past Surgical History  Procedure Laterality Date  . Coronary artery bypass graft  2007    redo CABG x2 -- at that time had left radial artery graft to the distal right coronary and saphenous vein graft to the distal circumflex.   . Coronary artery bypass graft  1997    redo  surgery x2 --  in January 18, 1996, and at that time had a vein graft to the diagonal and OM and a vein graft to the posterior descending and  posterolateral branches   . Coronary artery bypass graft  1992    x2 -- left internal mammary to the LAD, a single vein graft to the diagonal - obtuse marginal system  and posterior descending  . Insert / replace / remove pacemaker  01/29/2009    Medtronic Virtuoso II DR four chamber AICD  . Cardiac catheterization  01/07/2009     performed by Dr. Deborah Chalk on January 07, 2009 revealing medically manageable coronary artery disease with reduced EF at 30%   . Hernia repair   1998  . Prostate test  June 2013  . Exploration post operative open heart  2007  . Abdominal aortic aneurysm repair  07/16/2012   Social History:  reports that he quit smoking about 9 years ago. His smoking use included Cigarettes. He has a 47 pack-year smoking history. He has never used smokeless tobacco. He reports that he does not drink alcohol or use illicit drugs. Lives at home by himself  Allergies  Allergen Reactions  . Sulfonamide Derivatives Nausea And Vomiting    Family History  Problem Relation Age of Onset  . Heart attack Brother   . Coronary artery disease Brother   . Coronary artery disease Brother   . Heart disease Brother   . Sudden death Father   . Sudden death Mother     Prior to Admission medications   Medication Sig Start Date End Date Taking? Authorizing Provider  albuterol (PROVENTIL) (2.5 MG/3ML) 0.083% nebulizer solution Take 2.5 mg by nebulization daily.     Historical Provider, MD  aspirin 81 MG tablet Take 81 mg by mouth daily.      Historical Provider, MD  atorvastatin (LIPITOR) 40 MG tablet Take 1 tablet (40 mg total) by mouth daily. 04/22/12   Rosalio Macadamia, NP  clopidogrel (PLAVIX) 75 MG tablet TAKE 1 TABLET ONCE DAILY. 09/09/12   Rosalio Macadamia, NP  ezetimibe (ZETIA) 10 MG tablet Take 10 mg by mouth daily.    Historical Provider, MD  FLUoxetine (PROZAC) 40 MG capsule Take 40 mg by mouth daily.    Historical Provider, MD  Fluticasone-Salmeterol (ADVAIR DISKUS) 250-50 MCG/DOSE AEPB Inhale 1 puff into the lungs 2 (two) times daily. 09/18/12   Storm Frisk, MD  furosemide (LASIX) 40 MG tablet One a day 08/15/12   Rosalio Macadamia, NP  insulin detemir (LEVEMIR) 100 UNIT/ML injection Inject 20 Units into the skin daily. And 23 units if blood sugar is >150    Historical Provider, MD  Insulin Pen Needle (PEN NEEDLES 31GX5/16") 31G X 8 MM MISC  05/29/12   Historical Provider, MD  losartan (COZAAR) 25 MG tablet Take only if blood pressure above 120  08/15/12   Rosalio Macadamia, NP  Multiple Vitamin (MULTIVITAMIN WITH MINERALS) TABS Take 1 tablet by mouth daily.    Historical Provider, MD  nateglinide (STARLIX) 60 MG tablet Take 60 mg by mouth daily as needed (when eating heavy meals).  04/09/12   Historical Provider, MD  nebivolol (BYSTOLIC) 2.5 MG tablet Take 2.5 mg by mouth daily.    Historical Provider, MD  NON FORMULARY 6 liters rest 8L exertion 24/7    Historical Provider, MD  pantoprazole (PROTONIX) 40 MG tablet Take 1 tablet (40 mg total) by mouth 2 (two) times daily. 04/28/12   Viviann Spare  Anabel Halon, MD  potassium chloride SA (K-DUR,KLOR-CON) 20 MEQ tablet Take 1 tablet (20 mEq total) by mouth daily. 12/31/12   Duke Salvia, MD  predniSONE (DELTASONE) 10 MG tablet Take 10 mg daily 01/09/13   Storm Frisk, MD  SPIRIVA HANDIHALER 18 MCG inhalation capsule PLACE 1 CAPSULE INTO INHALER AND INHALE DAILY. 09/16/12   Storm Frisk, MD   Physical Exam: Filed Vitals:   01/14/13 1000  BP: 89/52  Pulse: 79  Temp: 97.6 F (36.4 C)  TempSrc: Oral  Resp: 14  Height: 5\' 6"  (1.676 m)  Weight: 77.8 kg (171 lb 8.3 oz)  SpO2: 95%    BP 89/52  Pulse 79  Temp(Src) 97.6 F (36.4 C) (Oral)  Resp 14  Ht 5\' 6"  (1.676 m)  Wt 77.8 kg (171 lb 8.3 oz)  BMI 27.7 kg/m2  SpO2 95%  General Appearance:    Alert, cooperative, no distress, appears stated age, to regain full sentences.   Head:    Normocephalic, without obvious abnormality, atraumatic           Throat:   Lips, mucosa, and tongue  dry  Neck:   Supple, symmetrical, trachea midline, positive JVD and positive hepatojugular reflux   Back:     Symmetric, no curvature, ROM normal, no CVA tenderness  Lungs:    moderate air movement no wheezing or crackles bilaterally.   Chest wall:    No tenderness or deformity  Heart:    Regular rate and rhythm, S1 and S2 normal, no murmur, rub   or gallop  Abdomen:     Soft, non-tender, bowel sounds active all four quadrants,    no masses, no organomegaly         Extremities:   Extremities normal, atraumatic, no cyanosis or edema  Pulses:   2+ and symmetric all extremities  Skin:   Skin color, texture, turgor normal, no rashes or lesions  Lymph nodes:   Cervical, supraclavicular, and axillary nodes normal  Neurologic:   CNII-XII intact. Normal strength, sensation and reflexes      throughout    Labs on Admission:  Basic Metabolic Panel: No results found for this basename: NA, K, CL, CO2, GLUCOSE, BUN, CREATININE, CALCIUM, MG, PHOS,  in the last 168 hours Liver Function Tests: No results found for this basename: AST, ALT, ALKPHOS, BILITOT, PROT, ALBUMIN,  in the last 168 hours No results found for this basename: LIPASE, AMYLASE,  in the last 168 hours No results found for this basename: AMMONIA,  in the last 168 hours CBC: No results found for this basename: WBC, NEUTROABS, HGB, HCT, MCV, PLT,  in the last 168 hours Cardiac Enzymes: No results found for this basename: CKTOTAL, CKMB, CKMBINDEX, TROPONINI,  in the last 168 hours  BNP (last 3 results)  Recent Labs  03/10/12 1605  PROBNP 238.0*   CBG:  Recent Labs Lab 01/14/13 1027  GLUCAP 134*    Radiological Exams on Admission: No results found.  EKG: Independently reviewed. pending  Assessment/Plan  Acute-on-chronic respiratory failure: - Multifactorial etiology most likely due to community-acquired pneumonia . I will go ahead and try him on a nasal cannula, we'll continue albuterol when necessary and spiriva. We'll keep him on oxygen. he quickly desaturated with conversation.  - Continue monitor his saturations. We'll check a ABG.    CAP (community acquired pneumonia)/Leukocytosis, unspecified: - He definitely has a left shift and the reading, on the previous CT scan from the hospital reads and has  new infiltrates in lungs. He is also complaining of new cough. Start him empirically on Rocephin and azithromycin. We'll repeat chest x-ray 2 view.  - Get sputum culture  and blood cultures.  Chronic Combined systolic and diastolic congestive heart failure/Medtronic dual-chamber ICD: - When he saw [name] on 4. 2014 a basic metabolic panel was drawn at that visit that showed a creatinine of 1.7. His weight was 181 lb (82.101 kg), today he is 16.6 (77.8 kg) - Previously creatinine of 0.9 back on December 2013.  On records from [place] his creatinine today is 1.8. We'll continue aggressive diuresis. And repeat a basic metabolic panel today.  - He does have positive JVD and hepatojugular reflux, he does have crackles on his lungs. I will go ahead and check a BNP, repeat a two-view x-ray. Start him on IV fluids.  - I will go ahead and hold his ACE inhibitor as his creatinine is increased from baseline, once his creatinine starts trending down we could reach in his ACE inhibitor. I will also - Continue his beta blocker.  AKI (acute kidney injury): - His baseline creatinine back in December 2013 0.9. [name]  his office on 12/31/2012 his creatinine had increased to 1.7, 181 lb (82.101 kg)at this time he was complaining of increasing shortness of breath. And [name] increase his diuretic as all the data pointed towards volume overload.  - I still think he is intravascular depleted. Start gentle Iv fluids.  - Strict I and O's   COPD with emphysema: - Continue albuterol and spiriva. At this point, we'll continue his prednisone home dose. - Good air movement, no wheezing.  Diabetes mellitus with peripheral vascular disease: - Check hemoglobin A1c continue sliding scale a.c. and at bedtime.   Atrial fibrillation - Rate controlled check a EKG .  Hypokalemia: - Replete check a Mag. And replete.  Code Status: full Family Communication: son Disposition Plan: inpatient 4-5 days  Time spent: 90 minutes  Marinda Elk [facility] Pager [phone]  If 7PM-7AM, please contact night-coverage [email or web address] Password [facility] 01/14/2013, 10:58  AM

## 2013-01-14 NOTE — Progress Notes (Signed)
Received pt on 55% venturi mask. Changed to n/c to facilitate eating. Pt desats during mealtime and requires temporaty upgrade to 10 Liter n/c the patient educated on takng deep breaths in between mouthfulls . Will continue to offer support and maintain Sats >90% . Left foot continues to ache: elevated and pt coping with Lortab X 2.

## 2013-01-14 NOTE — Progress Notes (Signed)
Pt BP sys in 80s. Pt asymptomatic & asleep. Notified K.Kirby of pt status. No new orders received. Will continue to monitor pt.

## 2013-01-15 ENCOUNTER — Telehealth: Payer: Self-pay | Admitting: Pulmonary Disease

## 2013-01-15 DIAGNOSIS — J8410 Pulmonary fibrosis, unspecified: Secondary | ICD-10-CM

## 2013-01-15 DIAGNOSIS — I5042 Chronic combined systolic (congestive) and diastolic (congestive) heart failure: Secondary | ICD-10-CM

## 2013-01-15 DIAGNOSIS — I517 Cardiomegaly: Secondary | ICD-10-CM

## 2013-01-15 DIAGNOSIS — J9620 Acute and chronic respiratory failure, unspecified whether with hypoxia or hypercapnia: Secondary | ICD-10-CM

## 2013-01-15 DIAGNOSIS — J9610 Chronic respiratory failure, unspecified whether with hypoxia or hypercapnia: Secondary | ICD-10-CM

## 2013-01-15 DIAGNOSIS — R7989 Other specified abnormal findings of blood chemistry: Secondary | ICD-10-CM

## 2013-01-15 DIAGNOSIS — E861 Hypovolemia: Secondary | ICD-10-CM | POA: Diagnosis present

## 2013-01-15 DIAGNOSIS — R0902 Hypoxemia: Secondary | ICD-10-CM

## 2013-01-15 DIAGNOSIS — I5032 Chronic diastolic (congestive) heart failure: Secondary | ICD-10-CM

## 2013-01-15 LAB — CBC
HCT: 35.8 % — ABNORMAL LOW (ref 39.0–52.0)
MCV: 91.6 fL (ref 78.0–100.0)
RBC: 3.91 MIL/uL — ABNORMAL LOW (ref 4.22–5.81)
WBC: 7.7 10*3/uL (ref 4.0–10.5)

## 2013-01-15 LAB — BASIC METABOLIC PANEL
BUN: 24 mg/dL — ABNORMAL HIGH (ref 6–23)
CO2: 22 mEq/L (ref 19–32)
Chloride: 107 mEq/L (ref 96–112)
Creatinine, Ser: 1.48 mg/dL — ABNORMAL HIGH (ref 0.50–1.35)

## 2013-01-15 LAB — COMPREHENSIVE METABOLIC PANEL
BUN: 27 mg/dL — ABNORMAL HIGH (ref 6–23)
CO2: 31 mEq/L (ref 19–32)
Chloride: 106 mEq/L (ref 96–112)
Creatinine, Ser: 1.91 mg/dL — ABNORMAL HIGH (ref 0.50–1.35)
GFR calc non Af Amer: 33 mL/min — ABNORMAL LOW (ref >90)
Glucose, Bld: 87 mg/dL (ref 70–99)
Total Bilirubin: 0.4 mg/dL (ref 0.3–1.2)

## 2013-01-15 LAB — LEGIONELLA ANTIGEN, URINE: Legionella Antigen, Urine: NEGATIVE

## 2013-01-15 LAB — TROPONIN I: Troponin I: <0.3 ng/mL (ref <0.30)

## 2013-01-15 LAB — PROCALCITONIN: Procalcitonin: 0.22 ng/mL

## 2013-01-15 LAB — GLUCOSE, CAPILLARY: Glucose-Capillary: 70 mg/dL (ref 70–99)

## 2013-01-15 NOTE — Consult Note (Signed)
PULMONARY  / CRITICAL CARE MEDICINE  Name: [name] MRN: [id number] DOB: 04/24/1936    ADMISSION DATE:  01/14/2013 CONSULTATION DATE:  01/15/13  REFERRING MD :  Dr. Sharon Seller PRIMARY SERVICE:  TRH  CHIEF COMPLAINT:  Fall / AMS / Hypoxia  BRIEF PATIENT DESCRIPTION: 77 y/o M admitted to [facility] on 4/16 PM from [facility] after his oxygen "ran out" and blacked out at home, falling (striking foot).  Work up at [facility] included at CT of chest which was negative for PE but had R midlung infiltrate, ABG with PaO2 of 50, increased WBC.    SIGNIFICANT EVENTS / STUDIES:    LINES / TUBES:   CULTURES: 4/16 BCx2>>> 4/16 Sputum>>>  ANTIBIOTICS:   HISTORY OF PRESENT ILLNESS:  77 y/o M, with PMH of COPD and pulmonary fibrosis on chronic oxygen between 6-8 L per minute, combined diastolic / systolic CHF (EF of 50% in 2013), AICD, HTN, HLD, and CVA.  Last seen by [name] in 11/2012 for Pulmonary fibrosis flare and was treated with a burst of steroids with slow taper back to 10 mg daily.  During this period, he also had an exacerbation of CHF with lasix adjustment per [name].    He presented to [facility] and was transferred to [facility] on 4/16 PM after his oxygen "ran out" and blacked out at home, falling (striking foot).  Work up at [facility] included at CT of chest which was negative for PE but had R midlung infiltrate, ABG with PaO2 of 50, increased WBC.  He is very difficult to question regarding pulmonary symptoms as he only wants to discuss how the machines are not working properly.  He becomes visibly upset in talking about the particulars of the O2 delivery machines.  He relays several episodes of his "oxygen becoming clogged with pressure" and running out and feels that this is the reason his lungs are worsening.   He currently denies new or increased shortness of breath, cough, fevers, chills in the week prior to admit.     PAST MEDICAL HISTORY :  Past Medical History   Diagnosis Date  . CAD (coronary artery disease)     s/p CABG; s/p redo CABG in 1997 and 2007; cath 4/10: LM occluded, CFX occluded, old S-RCA occluded, old S-RCA 90%, left radial to RCA ok, S-CFX ok, L-LAD ok, 2 old SVGs stumped  . Hypertension   . Hyperlipidemia   . Ischemic cardiomyopathy     echo 7/12: EF 45-50%, severe LVH, mild LAE; EF 50% per echo 2/13  . Chronic systolic heart failure     EF is 24% in the past; EF 45-50% in 7/12 and up to 50% in Feb 2013  . Carotid stenosis     doppler 11/11: 20-39% bilat.  . ICD (implantable cardiac defibrillator) in place 2010  . Hypertensive heart disease   . COPD (chronic obstructive pulmonary disease)   . History of stroke   . Hiatal hernia   . AAA (abdominal aortic aneurysm)     followed by [facility]  . Pulmonary fibrosis     followed by [name]; on chronic oxygen.   . Prolonged grief reaction   . Diabetes mellitus, insulin dependent (IDDM), controlled   . CHF (congestive heart failure)   . Myocardial infarction   . Stroke   . Shortness of breath     on home O2  . Rectal bleed February 2014   Past Surgical History  Procedure Laterality Date  .  Coronary artery bypass graft  2007    redo CABG x2 -- at that time had left radial artery graft to the distal right coronary and saphenous vein graft to the distal circumflex.   . Coronary artery bypass graft  1997    redo surgery x2 --  in January 18, 1996, and at that time had a vein graft to the diagonal and OM and a vein graft to the posterior descending and  posterolateral branches   . Coronary artery bypass graft  1992    x2 -- left internal mammary to the LAD, a single vein graft to the diagonal - obtuse marginal system  and posterior descending  . Insert / replace / remove pacemaker  01/29/2009    Medtronic Virtuoso II DR four chamber AICD  . Cardiac catheterization  01/07/2009     performed by Dr. Deborah Chalk on January 07, 2009 revealing medically manageable coronary artery disease with  reduced EF at 30%   . Hernia repair  1998  . Prostate test  June 2013  . Exploration post operative open heart  2007  . Abdominal aortic aneurysm repair  07/16/2012   Prior to Admission medications   Medication Sig Start Date End Date Taking? Authorizing Provider  albuterol (PROVENTIL) (2.5 MG/3ML) 0.083% nebulizer solution Take 2.5 mg by nebulization daily.    Yes Historical Provider, MD  aspirin 81 MG tablet Take 81 mg by mouth daily.     Yes Historical Provider, MD  atorvastatin (LIPITOR) 40 MG tablet Take 1 tablet (40 mg total) by mouth daily. 04/22/12  Yes Rosalio Macadamia, NP  clopidogrel (PLAVIX) 75 MG tablet TAKE 1 TABLET ONCE DAILY. 09/09/12  Yes Rosalio Macadamia, NP  ezetimibe (ZETIA) 10 MG tablet Take 10 mg by mouth daily.   Yes Historical Provider, MD  FLUoxetine (PROZAC) 40 MG capsule Take 40 mg by mouth daily.   Yes Historical Provider, MD  Fluticasone-Salmeterol (ADVAIR DISKUS) 250-50 MCG/DOSE AEPB Inhale 1 puff into the lungs 2 (two) times daily. 09/18/12  Yes Storm Frisk, MD  furosemide (LASIX) 40 MG tablet One a day 08/15/12  Yes Rosalio Macadamia, NP  insulin detemir (LEVEMIR) 100 UNIT/ML injection Inject 20 Units into the skin daily. And 23 units if blood sugar is >150   Yes Historical Provider, MD  Insulin Pen Needle (PEN NEEDLES 31GX5/16") 31G X 8 MM MISC  05/29/12  Yes Historical Provider, MD  losartan (COZAAR) 25 MG tablet Take only if blood pressure above 120 08/15/12  Yes Rosalio Macadamia, NP  Multiple Vitamin (MULTIVITAMIN WITH MINERALS) TABS Take 1 tablet by mouth daily.   Yes Historical Provider, MD  nateglinide (STARLIX) 60 MG tablet Take 60 mg by mouth daily as needed (when eating heavy meals).  04/09/12  Yes Historical Provider, MD  nebivolol (BYSTOLIC) 2.5 MG tablet Take 2.5 mg by mouth daily.   Yes Historical Provider, MD  NON FORMULARY 6 liters rest 8L exertion 24/7   Yes Historical Provider, MD  pantoprazole (PROTONIX) 40 MG tablet Take 1 tablet (40 mg total)  by mouth 2 (two) times daily. 04/28/12  Yes Duke Salvia, MD  potassium chloride SA (K-DUR,KLOR-CON) 20 MEQ tablet Take 1 tablet (20 mEq total) by mouth daily. 12/31/12  Yes Duke Salvia, MD  predniSONE (DELTASONE) 10 MG tablet Take 10 mg daily 01/09/13  Yes Storm Frisk, MD  SPIRIVA HANDIHALER 18 MCG inhalation capsule PLACE 1 CAPSULE INTO INHALER AND INHALE DAILY. 09/16/12  Yes Charlcie Cradle  Delford Field, MD   Allergies  Allergen Reactions  . Sulfa Antibiotics Nausea And Vomiting    This occurred when pt was 77 years old     FAMILY HISTORY:  Family History  Problem Relation Age of Onset  . Heart attack Brother   . Coronary artery disease Brother   . Coronary artery disease Brother   . Heart disease Brother   . Sudden death Father   . Sudden death Mother    SOCIAL HISTORY:  reports that he quit smoking about 9 years ago. His smoking use included Cigarettes. He has a 47 pack-year smoking history. He has never used smokeless tobacco. He reports that he does not drink alcohol or use illicit drugs.  REVIEW OF SYSTEMS:   All systems reviewed, pertinent positives in HPI.  Otherwise negative.   SUBJECTIVE: RN reports periods of desaturations with activity.  VITAL SIGNS: Temp:  [97.8 F (36.6 C)-98.6 F (37 C)] 98.6 F (37 C) (04/17 0857) Pulse Rate:  [71-80] 80 (04/17 0857) Resp:  [15-24] 16 (04/17 0857) BP: (80-110)/(44-79) 108/67 mmHg (04/17 0857) SpO2:  [90 %-97 %] 92 % (04/17 0857) Weight:  [177 lb 11.1 oz (80.6 kg)] 177 lb 11.1 oz (80.6 kg) (04/17 0500)  PHYSICAL EXAMINATION: General:  Chronically ill elderly male in NAD Neuro:  AAOx4, speech clear, MAE HEENT:  Mm pink / moist, no jvd Cardiovascular:  s1s2 rrr, distant Lungs:  resp's even/non-labored at rest, lungs bilaterally with fine crackles Abdomen:  Obese/soft, bsx4 active Musculoskeletal:  No acute deformities, LLE with bruising s/p fall (appears old) Skin:  Warm/dry, minimal LE edema   Recent Labs Lab  01/14/13 1115 01/15/13 0454  NA  --  143  K  --  5.8*  CL  --  106  CO2  --  31  BUN  --  27*  CREATININE 1.79* 1.91*  GLUCOSE  --  87    Recent Labs Lab 01/14/13 1115 01/15/13 0454  HGB 12.8* 11.8*  HCT 38.8* 35.8*  WBC 12.0* 7.7  PLT 145* 114*   Dg Chest Port 1 View  01/14/2013  *RADIOLOGY REPORT*  Clinical Data: Shortness of breath, chest pain, hypertension, coronary artery disease post MI and CABG, COPD, ischemic cardiomyopathy, pulmonary fibrosis, diabetes, CHF  PORTABLE CHEST - 1 VIEW  Comparison: Portable exam 1156 hours compared to 0220 hours  Findings: Left subclavian sequential transvenous pacemaker leads project at right atrium and right ventricle. Enlargement of cardiac silhouette post CABG. Chronic peripheral interstitial prominence question pulmonary fibrosis. No definite acute superimposed infiltrate. Underlying emphysematous changes. No pleural effusion, pneumothorax or acute osseous findings.  IMPRESSION: Enlargement of cardiac silhouette post CABG and pacemaker / AICD. Pulmonary fibrosis. No acute abnormalities.   Original Report Authenticated By: Ulyses Southward, M.D.     ASSESSMENT / PLAN:  Pulmonary Fibrosis on chronic O2 - wears 6L at rest and 8L with activity COPD - without evidence of acute exacerbation  Plan: -O2 to support sats >88% -investigate home O2 set up.  Would like to have [facility] go to his house and re-instruct him on how to change humidity bottle and use equipment  -discussed oxygen delivery method with [facility] Rep in hospital and he relays concerns that patient may be overfilling the humidity bottle which may contribute to no oxygen delivery -continue dulera, prednisone, spiriva -he needs a better support system in place for home to help him with care, recommend home safety assessment.  He notes stories of being in the dark and could  figure out how to turn on the oxygen when the power was out.      Canary Brim, NP-C [facility] Pgr: [phone] or [phone]  Independently examined pt, evaluated data & formulated above care plan with NP I spent over 40 mins discussing these issues with this pt & have left order for [facility] to check in with him next week  Blaine Hari V.   01/15/2013, 12:13 PM

## 2013-01-15 NOTE — Progress Notes (Signed)
  Echocardiogram 2D Echocardiogram has been performed.  Georgian Co 01/15/2013, 4:34 PM

## 2013-01-15 NOTE — Telephone Encounter (Signed)
[facility] supplies him with O2 [place]) He has problems with O2 running out - can they check on - his spare tanks -his humidifier & his method of refilling water into the humidifier -alternative means of O2 supply - he is on 6L rest & 8L exertion

## 2013-01-15 NOTE — Telephone Encounter (Signed)
Please put an order in for this pt's supplies at [facility] thanks Tobe Sos

## 2013-01-15 NOTE — Telephone Encounter (Signed)
pls f/u on this request and thanks  pw

## 2013-01-15 NOTE — Telephone Encounter (Signed)
Order sent to [facility].  

## 2013-01-15 NOTE — Consult Note (Signed)
Reason for Consult: Elevated troponin Referring Physician: [facility] hospital[facility] Primary cardiologist: Dr. Sonny Masters is an 77 y.o. male.  HPI: This 77 year old gentleman is admitted earlier this morning in transfer from [facility].  He presented there during the night last night after he became weak and disoriented when his oxygen supply ran out.  He also fell and injured his left foot because of his low oxygen status.  The patient has a history of oxygen-dependent COPD.  He has a history of combined diastolic and systolic congestive heart failure.  His most recent echocardiogram February 2013 showed an ejection fraction of 50%.  In the past his ejection fraction had been as low as 24% and he does have a implantable defibrillator in place.  He has had no recent shocks from his defibrillator.  He has had 3 separate coronary artery bypass graft surgery operations.  He had a redo CABG in 1997 and again in 2007.  His last cardiac catheterization was in April of 2010.  The patient has not been experiencing any recent chest pain to suggest angina pectoris.  He recently had an exacerbation of his COPD and pulmonary fibrosis and had to be on supplemental tapering steroids.  While on steroids he did experience fluid retention which responded to extra diuretics prescribed by [name].  Past Medical History  Diagnosis Date  . CAD (coronary artery disease)     s/p CABG; s/p redo CABG in 1997 and 2007; cath 4/10: LM occluded, CFX occluded, old S-RCA occluded, old S-RCA 90%, left radial to RCA ok, S-CFX ok, L-LAD ok, 2 old SVGs stumped  . Hypertension   . Hyperlipidemia   . Ischemic cardiomyopathy     echo 7/12: EF 45-50%, severe LVH, mild LAE; EF 50% per echo 2/13  . Chronic systolic heart failure     EF is 24% in the past; EF 45-50% in 7/12 and up to 50% in Feb 2013  . Carotid stenosis     doppler 11/11: 20-39% bilat.  . ICD (implantable cardiac defibrillator) in place 2010  .  Hypertensive heart disease   . COPD (chronic obstructive pulmonary disease)   . History of stroke   . Hiatal hernia   . AAA (abdominal aortic aneurysm)     followed by [facility]  . Pulmonary fibrosis     followed by [name]; on chronic oxygen.   . Prolonged grief reaction   . Diabetes mellitus, insulin dependent (IDDM), controlled   . CHF (congestive heart failure)   . Myocardial infarction   . Stroke   . Shortness of breath     on home O2  . Rectal bleed February 2014    Past Surgical History  Procedure Laterality Date  . Coronary artery bypass graft  2007    redo CABG x2 -- at that time had left radial artery graft to the distal right coronary and saphenous vein graft to the distal circumflex.   . Coronary artery bypass graft  1997    redo surgery x2 --  in January 18, 1996, and at that time had a vein graft to the diagonal and OM and a vein graft to the posterior descending and  posterolateral branches   . Coronary artery bypass graft  1992    x2 -- left internal mammary to the LAD, a single vein graft to the diagonal - obtuse marginal system  and posterior descending  . Insert / replace / remove pacemaker  01/29/2009    [employer]  Virt[address]oso II DR four chamber AICD  . Cardiac catheterization  01/07/2009     performed by Dr. Deborah Chalk on January 07, 2009 revealing medically manageable coronary artery disease with reduced EF at 30%   . Hernia repair  1998  . Prostate test  June 2013  . Exploration post operative open heart  2007  . Abdominal aortic aneurysm repair  07/16/2012    Family History  Problem Relation Age of Onset  . Heart attack Brother   . Coronary artery disease Brother   . Coronary artery disease Brother   . Heart disease Brother   . Sudden death Father   . Sudden death Mother     Social History:  reports that he quit smoking about 9 years ago. His smoking use included Cigarettes. He has a 47 pack-year smoking history. He has never used smokeless tobacco. He reports  that he does not drink alcohol or use illicit drugs.  Allergies:  Allergies  Allergen Reactions  . Sulfa Antibiotics Nausea And Vomiting    This occurred when pt was 77 years old     Medications:  Scheduled: . aspirin EC  81 mg Oral Daily  . atorvastatin  40 mg Oral q1800  . ezetimibe  10 mg Oral Daily  . FLUoxetine  40 mg Oral Daily  . heparin  5,000 Units Subcutaneous Q8H  . insulin aspart  0-15 Units Subcutaneous TID WC  . insulin aspart  3 Units Subcutaneous TID WC  . insulin detemir  20 Units Subcutaneous Daily  . mometasone-formoterol  2 puff Inhalation BID  . multivitamin with minerals  1 tablet Oral Daily  . pantoprazole  40 mg Oral BID  . potassium chloride SA  20 mEq Oral Daily  . predniSONE  10 mg Oral Q breakfast  . sodium chloride  3 mL Intravenous Q12H  . tiotropium  18 mcg Inhalation Daily    Results for orders placed during the hospital encounter of 01/14/13 (from the past 48 hour(s))  GLUCOSE, CAPILLARY     Status: Abnormal   Collection Time    01/14/13 10:27 AM      Result Value Range   Glucose-Capillary 134 (*) 70 - 99 mg/dL   Comment 1 Notify RN    TROPONIN I     Status: Abnormal   Collection Time    01/14/13 11:00 AM      Result Value Range   Troponin I 0.88 (*) <0.30 ng/mL   Comment:            Due to the release kinetics of cTnI,     a negative result within the first hours     of the onset of symptoms does not rule out     myocardial infarction with certainty.     If myocardial infarction is still suspected,     repeat the test at appropriate intervals.     CRITICAL RESULT CALLED TO, READ BACK BY AND VERIFIED WITH:     B.RONCALLO,RN 01/14/13 1211 BY BSLADE  PRO B NATRIURETIC PEPTIDE     Status: Abnormal   Collection Time    01/14/13 11:00 AM      Result Value Range   Pro B Natriuretic peptide (BNP) 1684.0 (*) 0 - 450 pg/mL  CULTURE, BLOOD (ROUTINE X 2)     Status: None   Collection Time    01/14/13 11:00 AM      Result Value Range    Specimen Description BLOOD RIGHT ARM  Special Requests BOTTLES DRAWN AEROBIC ONLY 7CC     Culture  Setup Time 01/14/2013 14:31     Culture       Value:        BLOOD CULTURE RECEIVED NO GROWTH TO DATE CULTURE WILL BE HELD FOR 5 DAYS BEFORE ISSUING A FINAL NEGATIVE REPORT   Report Status PENDING    BLOOD GAS, ARTERIAL     Status: Abnormal   Collection Time    01/14/13 11:11 AM      Result Value Range   O2 Content 7.0     Delivery systems NASAL CANNULA     pH, Arterial 7.397  7.350 - 7.450   pCO2 arterial 44.9  35.0 - 45.0 mmHg   pO2, Arterial 92.8  80.0 - 100.0 mmHg   Bicarbonate 27.0 (*) 20.0 - 24.0 mEq/L   TCO2 28.4  0 - 100 mmol/L   Acid-Base Excess 2.6 (*) 0.0 - 2.0 mmol/L   O2 Saturation 96.9     Patient temperature 98.6     Collection site REVIEWED BY     Drawn by [id number]     Sample type ARTERIAL     Allens test (pass/fail) PASS  PASS  CBC     Status: Abnormal   Collection Time    01/14/13 11:15 AM      Result Value Range   WBC 12.0 (*) 4.0 - 10.5 K/uL   Comment: WHITE COUNT CONFIRMED ON SMEAR   RBC 4.33  4.22 - 5.81 MIL/uL   Hemoglobin 12.8 (*) 13.0 - 17.0 g/dL   HCT 04.5 (*) 40.9 - 81.1 %   MCV 89.6  78.0 - 100.0 fL   MCH 29.6  26.0 - 34.0 pg   MCHC 33.0  30.0 - 36.0 g/dL   RDW 91.4  78.2 - 95.6 %   Platelets 145 (*) 150 - 400 K/uL   Comment: PLATELET COUNT CONFIRMED BY SMEAR  CREATININE, SERUM     Status: Abnormal   Collection Time    01/14/13 11:15 AM      Result Value Range   Creatinine, Ser 1.79 (*) 0.50 - 1.35 mg/dL   GFR calc non Af Amer 35 (*) >90 mL/min   GFR calc Af Amer 41 (*) >90 mL/min   Comment:            The eGFR has been calculated     using the CKD EPI equation.     This calculation has not been     validated in all clinical     situations.     eGFR's persistently     <90 mL/min signify     possible Chronic Kidney Disease.  HEMOGLOBIN A1C     Status: Abnormal   Collection Time    01/14/13 11:15 AM      Result Value Range   Hemoglobin  A1C 7.0 (*) <5.7 %   Comment: (NOTE)                                                                               According to the ADA Clinical Practice Recommendations for 2011, when     HbA1c is used as a screening test:      >=  6.5%   Diagnostic of Diabetes Mellitus               (if abnormal result is confirmed)     5.7-6.4%   Increased risk of developing Diabetes Mellitus     References:Diagnosis and Classification of Diabetes Mellitus,Diabetes     Care,2011,34(Suppl 1):S62-S69 and Standards of Medical Care in             Diabetes - 2011,Diabetes Care,2011,34 (Suppl 1):S11-S61.   Mean Plasma Glucose 154 (*) <117 mg/dL  TSH     Status: None   Collection Time    01/14/13 11:15 AM      Result Value Range   TSH 2.255  0.350 - 4.500 uIU/mL  HIV ANTIBODY (ROUTINE TESTING)     Status: None   Collection Time    01/14/13 11:15 AM      Result Value Range   HIV NON REACTIVE  NON REACTIVE  CULTURE, BLOOD (ROUTINE X 2)     Status: None   Collection Time    01/14/13 11:15 AM      Result Value Range   Specimen Description BLOOD RIGHT THUMB     Special Requests BOTTLES DRAWN AEROBIC ONLY 6CC     Culture  Setup Time 01/14/2013 14:31     Culture       Value:        BLOOD CULTURE RECEIVED NO GROWTH TO DATE CULTURE WILL BE HELD FOR 5 DAYS BEFORE ISSUING A FINAL NEGATIVE REPORT   Report Status PENDING    MAGNESIUM     Status: None   Collection Time    01/14/13 11:15 AM      Result Value Range   Magnesium 2.1  1.5 - 2.5 mg/dL  MRSA PCR SCREENING     Status: None   Collection Time    01/14/13 11:55 AM      Result Value Range   MRSA by PCR NEGATIVE  NEGATIVE   Comment:            The GeneXpert MRSA Assay (FDA     approved for NASAL specimens     only), is one component of a     comprehensive MRSA colonization     surveillance program. It is not     intended to diagnose MRSA     infection nor to guide or     monitor treatment for     MRSA infections.  STREP PNEUMONIAE URINARY ANTIGEN      Status: None   Collection Time    01/14/13  3:50 PM      Result Value Range   Strep Pneumo Urinary Antigen NEGATIVE  NEGATIVE   Comment:            Infection due to S. pneumoniae     cannot be absolutely ruled out     since the antigen present     may be below the detection limit     of the test.  GLUCOSE, CAPILLARY     Status: Abnormal   Collection Time    01/14/13  4:19 PM      Result Value Range   Glucose-Capillary 143 (*) 70 - 99 mg/dL   Comment 1 Notify RN    TROPONIN I     Status: Abnormal   Collection Time    01/14/13  4:53 PM      Result Value Range   Troponin I 0.59 (*) <0.30 ng/mL   Comment:  Due to the release kinetics of cTnI,     a negative result within the first hours     of the onset of symptoms does not rule out     myocardial infarction with certainty.     If myocardial infarction is still suspected,     repeat the test at appropriate intervals.     CRITICAL VALUE NOTED.  VALUE IS CONSISTENT WITH PREVIOUSLY REPORTED AND CALLED VALUE.  GLUCOSE, CAPILLARY     Status: Abnormal   Collection Time    01/14/13  9:22 PM      Result Value Range   Glucose-Capillary 140 (*) 70 - 99 mg/dL  TROPONIN I     Status: Abnormal   Collection Time    01/14/13 10:17 PM      Result Value Range   Troponin I 0.32 (*) <0.30 ng/mL   Comment:            Due to the release kinetics of cTnI,     a negative result within the first hours     of the onset of symptoms does not rule out     myocardial infarction with certainty.     If myocardial infarction is still suspected,     repeat the test at appropriate intervals.     CRITICAL VALUE NOTED.  VALUE IS CONSISTENT WITH PREVIOUSLY REPORTED AND CALLED VALUE.  CBC     Status: Abnormal   Collection Time    01/15/13  4:54 AM      Result Value Range   WBC 7.7  4.0 - 10.5 K/uL   RBC 3.91 (*) 4.22 - 5.81 MIL/uL   Hemoglobin 11.8 (*) 13.0 - 17.0 g/dL   HCT 16.1 (*) 09.6 - 04.5 %   MCV 91.6  78.0 - 100.0 fL   MCH 30.2   26.0 - 34.0 pg   MCHC 33.0  30.0 - 36.0 g/dL   RDW 40.9  81.1 - 91.4 %   Platelets 114 (*) 150 - 400 K/uL   Comment: PLATELET COUNT CONFIRMED BY SMEAR  COMPREHENSIVE METABOLIC PANEL     Status: Abnormal   Collection Time    01/15/13  4:54 AM      Result Value Range   Sodium 143  135 - 145 mEq/L   Potassium 5.8 (*) 3.5 - 5.1 mEq/L   Comment: HEMOLYSIS AT THIS LEVEL MAY AFFECT RESULT   Chloride 106  96 - 112 mEq/L   CO2 31  19 - 32 mEq/L   Glucose, Bld 87  70 - 99 mg/dL   BUN 27 (*) 6 - 23 mg/dL   Creatinine, Ser 7.82 (*) 0.50 - 1.35 mg/dL   Calcium 8.9  8.4 - 95.6 mg/dL   Total Protein 6.2  6.0 - 8.3 g/dL   Albumin 2.8 (*) 3.5 - 5.2 g/dL   AST 38 (*) 0 - 37 U/L   ALT 21  0 - 53 U/L   Alkaline Phosphatase 26 (*) 39 - 117 U/L   Total Bilirubin 0.4  0.3 - 1.2 mg/dL   GFR calc non Af Amer 33 (*) >90 mL/min   GFR calc Af Amer 38 (*) >90 mL/min   Comment:            The eGFR has been calculated     using the CKD EPI equation.     This calculation has not been     validated in all clinical     situations.  eGFR's persistently     <90 mL/min signify     possible Chronic Kidney Disease.  GLUCOSE, CAPILLARY     Status: None   Collection Time    01/15/13  8:26 AM      Result Value Range   Glucose-Capillary 80  70 - 99 mg/dL   Comment 1 Notify RN      Dg Chest Port 1 View  01/14/2013  *RADIOLOGY REPORT*  Clinical Data: Shortness of breath, chest pain, hypertension, coronary artery disease post MI and CABG, COPD, ischemic cardiomyopathy, pulmonary fibrosis, diabetes, CHF  PORTABLE CHEST - 1 VIEW  Comparison: Portable exam 1156 hours compared to 0220 hours  Findings: Left subclavian sequential transvenous pacemaker leads project at right atrium and right ventricle. Enlargement of cardiac silhouette post CABG. Chronic peripheral interstitial prominence question pulmonary fibrosis. No definite acute superimposed infiltrate. Underlying emphysematous changes. No pleural effusion,  pneumothorax or acute osseous findings.  IMPRESSION: Enlargement of cardiac silhouette post CABG and pacemaker / AICD. Pulmonary fibrosis. No acute abnormalities.   Original Report Authenticated By: Ulyses Southward, M.D.     Review of systems negative except as noted in present illness Blood pressure 108/67, pulse 80, temperature 98.6 F (37 C), temperature source Oral, resp. rate 16, height 5\' 6"  (1.676 m), weight 177 lb 11.1 oz (80.6 kg), SpO2 92.00%.  Physical Exam: The patient appears to be in no distress.  He is lying flat comfortably.  Nasal oxygen in place.  Head and neck exam reveals that the pupils are equal and reactive.  The extraocular movements are full.  There is no scleral icterus.  Mouth and pharynx are benign.  No lymphadenopathy.  No carotid bruits.  The jugular venous pressure is normal.  Thyroid is not enlarged or tender.  Chest reveals fine inspiratory rales bilaterally.  These are consistent with his pulmonary fibrosis.  Heart reveals no abnormal lift or heave.  First and second heart sounds are normal.  There is no murmur gallop rub or click.  The abdomen is soft and nontender.  Bowel sounds are normoactive.  There is no hepatosplenomegaly or mass.  There are no abdominal bruits.  Extremities reveal weak pedal pulses.  He has ecchymosis and swelling of the left foot secondary to recent injury.  Neurologic exam is normal strength and no lateralizing weakness.  No sensory deficits.  Integument reveals no rash   Imaging: Dg Chest Port 1 View  01/14/2013  *RADIOLOGY REPORT*  Clinical Data: Shortness of breath, chest pain, hypertension, coronary artery disease post MI and CABG, COPD, ischemic cardiomyopathy, pulmonary fibrosis, diabetes, CHF  PORTABLE CHEST - 1 VIEW  Comparison: Portable exam 1156 hours compared to 0220 hours  Findings: Left subclavian sequential transvenous pacemaker leads project at right atrium and right ventricle. Enlargement of cardiac silhouette post  CABG. Chronic peripheral interstitial prominence question pulmonary fibrosis. No definite acute superimposed infiltrate. Underlying emphysematous changes. No pleural effusion, pneumothorax or acute osseous findings.  IMPRESSION: Enlargement of cardiac silhouette post CABG and pacemaker / AICD. Pulmonary fibrosis. No acute abnormalities.   Original Report Authenticated By: Ulyses Southward, M.D.     Cardiac Studies: EKG shows normal sinus rhythm with occasional PVC.  There are nonspecific ST and T-wave abnormalities which are unchanged from 07/04/12 Assessment/Plan:  1. acute on chronic respiratory failure with oxygen-dependent COPD and pulmonary fibrosis and possible superimposed community-acquired pneumonia 2. ischemic heart disease status post coronary artery bypass graft surgery 3 separate times.  Last cardiac catheterization 2010 and medical therapy recommended. 3.  mild troponin bump secondary to supply demand mismatch secondary to profound hypoxemia secondary to problems with his oxygen delivery system at home last evening.  No recent chest discomfort to suggest recurrent angina pectoris. 4. chronic combined systolic and diastolic congestive heart failure.  At the present time he appears to be euvolemic clinically. 5. acute on chronic renal insufficiency.  Agree with gentle IV fluids  Recommendation: We will update his two-dimensional echocardiogram.  Trend cardiac enzymes.  Continue medical therapy.  No indication for cardiac catheterization at this time. Will follow with you  LOS: 1 day    Cassell Clement 01/15/2013, 11:38 AM

## 2013-01-15 NOTE — Progress Notes (Signed)
[facility] Progress Note [place] TEAM 1 - Stepdown/ICU TEAM   [name] YQI:[id number] DOB: April 23, 1936 DOA: 01/14/2013 PCP: Lucila [name], MD  Brief narrative: 77 year old male with COPD and pulmonary fibrosis on chronic oxygen between 6-8 L per minute. He also has combined diastolic and systolic heart failure. Last echocardiogram at the end of 2013 measure to improve ejection fraction of 50% with medical management he is also had a dual-chamber ICD placed. He is also on chronic prednisone. He was last seen in the office by [name] in February of this year with dyspnea and hypoxemia felt to be related to progressive pulmonary fibrosis. He was given a burst of steroids and tapered back down to his usual prednisone dose with plans to followup in 2 months. He was last seen by [name] in the office at the beginning of April at which point he was having increasing dyspnea and some increasing lower extremity edema in the setting of recent increase in steroids as noted above. It was also determined that he became extremely short of breath after running out of his oxygen. He had noticed swelling and took extra Lasix which seemed to help with his shortness of breath therefore his Lasix dosage had been increased utilizing an alternating day schedule. He presented to [facility] because of progressive dyspnea and apparent worsening productive cough. CT of the chest was negative for PE and demonstrated stable interstitial and alveolar changes and possibly a new infiltrate in the right mid lung. ABG revealed PO2 of 50 and he was put on 100% nonrebreather mask with improvement in saturations. He was also found to have a white count of 15,000 with a left shift. The patient requests to be transferred to [facility] so he could be cared for by his his cardiology and pulmonology physicians.  Assessment/Plan: Active Problems:   Acute and chronic respiratory failure with hypoxia due to:    A) Pulmonary fibrosis   B) COPD with emphysema -sx's more c/w recurrent dyspnea due to pulmonary fibrosis -PCT normal and pt endorses his sx's not c/w prior episodes of PNA or bronchitis -dc anbx's (CXR and CT chest, not c/w PNA). -continue steroids as per home dose- consult pulmonary medicine to determine if any add'l tx options -avoid high dose oxygen since could precipitate sx hypercarbia ** RN called- pt with desat to 60%."gasping for air" and took about 15 minutes to recover to mid 80's- this is similar to episode in Feb at pulmonologist's ofc- will hold transfer for now-await pulm recs. I suspect this is related to progressive pulmonary fibrosis.    Chronic diastolic and systolic heart failure -compensated at present since pt actually volume depleted 2/2 recent Lasix increase -holding BB and ACEI due to suboptimal BP -hold Lasix 2/2 DH -Cards repeating ECHO this admit    Leukocytosis, unspecified -likely due to demand ischemia/hypoperfusion from hypotension/dehydration -no fever   Mild elevation in cardiac enzymes -suspect demand ischemia as noted above -appreciate cards eval  ARF on CKD (chronic kidney disease) stage 3, GFR 30-59 ml/min -due to recent low volume from Lasix -cont gentle IV hydration    Hypovolemia dehydration -as above  -hold diuretics and cont IVF cautiously  Left foot pain and swelling -endorsed pre admit injury and had walking boot at home -PT/OT eval    CAD -stable and no true ischemic sx's  -cont ASA and Plavix    Diabetes mellitus with peripheral vascular disease -controlled    Hyperkalemia -due to [facility] and  ARF -rpt BMET at 1800 (4/17) and in am    Medtronic dual-chamber ICD   DVT prophylaxis: Subcutaneous heparin Code Status: Full Family Communication: Patient Disposition Plan: Keep in SDU today given desats. Isolation: None  Consultants: Pulmonary Cardiology  Procedures: 2-D echocardiogram pending  Antibiotics: Zithromax 4/16  >>> 4/17 Rocephin 4/16 >>> 4/17  HPI/Subjective: Patient alert and endorses breathing much better. Describes prior issues regarding running out of oxygen and how this affected his breathing. Currently endorsing left foot and ankle discomfort after injury that occurred prior to admission stating has immobilization boot at home.   Objective: Blood pressure 108/67, pulse 80, temperature 98.6 F (37 C), temperature source Oral, resp. rate 16, height 5\' 6"  (1.676 m), weight 80.6 kg (177 lb 11.1 oz), SpO2 92.00%.  Intake/Output Summary (Last 24 hours) at 01/15/13 1228 Last data filed at 01/15/13 0858  Gross per 24 hour  Intake 1828.33 ml  Output    700 ml  Net 1128.33 ml     Exam: General: No acute respiratory distress Lungs: Coarse to auscultation bilaterally with fine crackles, 6 L Cardiovascular: Regular rate and rhythm without murmur gallop or rub normal S1 and S2, no peripheral edema (see below) or JVD Abdomen: Nontender, nondistended, soft, bowel sounds positive, no rebound, no ascites, no appreciable mass Musculoskeletal: No significant cyanosis, clubbing of bilateral lower extremities-does have focal edema of left dorsum of foot Neurological: Alert and oriented x 3, moves all extremities x 4 without focal neurological deficits, CN 2-13 intact  Data Reviewed: Basic Metabolic Panel:  Recent Labs Lab 01/14/13 1115 01/15/13 0454  NA  --  143  K  --  5.8*  CL  --  106  CO2  --  31  GLUCOSE  --  87  BUN  --  27*  CREATININE 1.79* 1.91*  CALCIUM  --  8.9  MG 2.1  --    Liver Function Tests:  Recent Labs Lab 01/15/13 0454  AST 38*  ALT 21  ALKPHOS 26*  BILITOT 0.4  PROT 6.2  ALBUMIN 2.8*   No results found for this basename: LIPASE, AMYLASE,  in the last 168 hours No results found for this basename: AMMONIA,  in the last 168 hours CBC:  Recent Labs Lab 01/14/13 1115 01/15/13 0454  WBC 12.0* 7.7  HGB 12.8* 11.8*  HCT 38.8* 35.8*  MCV 89.6 91.6  PLT 145*  114*   Cardiac Enzymes:  Recent Labs Lab 01/14/13 1100 01/14/13 1653 01/14/13 2217  TROPONINI 0.88* 0.59* 0.32*   BNP (last 3 results)  Recent Labs  03/10/12 1605 01/14/13 1100  PROBNP 238.0* 1684.0*   CBG:  Recent Labs Lab 01/14/13 1027 01/14/13 1619 01/14/13 2122 01/15/13 0826  GLUCAP 134* 143* 140* 80    Recent Results (from the past 240 hour(s))  CULTURE, BLOOD (ROUTINE X 2)     Status: None   Collection Time    01/14/13 11:00 AM      Result Value Range Status   Specimen Description BLOOD RIGHT ARM   Final   Special Requests BOTTLES DRAWN AEROBIC ONLY 7CC   Final   Culture  Setup Time 01/14/2013 14:31   Final   Culture     Final   Value:        BLOOD CULTURE RECEIVED NO GROWTH TO DATE CULTURE WILL BE HELD FOR 5 DAYS BEFORE ISSUING A FINAL NEGATIVE REPORT   Report Status PENDING   Incomplete  CULTURE, BLOOD (ROUTINE X 2)  Status: None   Collection Time    01/14/13 11:15 AM      Result Value Range Status   Specimen Description BLOOD RIGHT THUMB   Final   Special Requests BOTTLES DRAWN AEROBIC ONLY 6CC   Final   Culture  Setup Time 01/14/2013 14:31   Final   Culture     Final   Value:        BLOOD CULTURE RECEIVED NO GROWTH TO DATE CULTURE WILL BE HELD FOR 5 DAYS BEFORE ISSUING A FINAL NEGATIVE REPORT   Report Status PENDING   Incomplete  MRSA PCR SCREENING     Status: None   Collection Time    01/14/13 11:55 AM      Result Value Range Status   MRSA by PCR NEGATIVE  NEGATIVE Final   Comment:            The GeneXpert MRSA Assay (FDA     approved for NASAL specimens     only), is one component of a     comprehensive MRSA colonization     surveillance program. It is not     intended to diagnose MRSA     infection nor to guide or     monitor treatment for     MRSA infections.     Studies:  Recent x-ray studies have been reviewed in detail by the Attending Physician  Scheduled Meds:  Reviewed in detail by the Attending Physician   Junious Silk, ANP [facility] Office  [phone] Pager [phone]  On-Call/Text Page:      [email or web address]      password [email or web address]  If 7PM-7AM, please contact night-coverage [email or web address] Password [email or web address] 01/15/2013, 12:28 PM   LOS: 1 day   Patient seen and examined. Agree with assessment and plan by Junious Silk, NP. I suspect his recurrent hypoxemic respiratory failure is secondary mostly to his pulmonary fibrosis. I do not see any evidence for pneumonia on his CT chest, chest x-ray, he also has no subjective signs of pneumonia. We have decided to discontinue antibiotics. Await pulmonary recommendations as to recurrent hypoxemia and pulmonary fibrosis.  Peggye Pitt, MD [facility] Pager: [phone]

## 2013-01-16 DIAGNOSIS — J189 Pneumonia, unspecified organism: Secondary | ICD-10-CM

## 2013-01-16 LAB — GLUCOSE, CAPILLARY
Glucose-Capillary: 68 mg/dL — ABNORMAL LOW (ref 70–99)
Glucose-Capillary: 90 mg/dL (ref 70–99)

## 2013-01-16 LAB — COMPREHENSIVE METABOLIC PANEL
ALT: 18 U/L (ref 0–53)
AST: 27 U/L (ref 0–37)
Albumin: 2.6 g/dL — ABNORMAL LOW (ref 3.5–5.2)
Alkaline Phosphatase: 22 U/L — ABNORMAL LOW (ref 39–117)
BUN: 22 mg/dL (ref 6–23)
Chloride: 107 mEq/L (ref 96–112)
Potassium: 4.6 mEq/L (ref 3.5–5.1)
Total Bilirubin: 0.5 mg/dL (ref 0.3–1.2)

## 2013-01-16 LAB — CBC
MCH: 30.5 pg (ref 26.0–34.0)
MCHC: 33.4 g/dL (ref 30.0–36.0)
MCV: 91.2 fL (ref 78.0–100.0)
Platelets: 88 10*3/uL — ABNORMAL LOW (ref 150–400)
RDW: 15.3 % (ref 11.5–15.5)
WBC: 8.3 10*3/uL (ref 4.0–10.5)

## 2013-01-16 MED ORDER — ALBUTEROL SULFATE (5 MG/ML) 0.5% IN NEBU
2.5000 mg | INHALATION_SOLUTION | RESPIRATORY_TRACT | Status: DC | PRN
  Filled 2013-01-16: qty 0.5

## 2013-01-16 NOTE — Evaluation (Signed)
Occupational Therapy Evaluation Patient Details Name: [name] MRN: [id number] DOB: 1936-05-26 Today's Date: 01/16/2013 Time: 5643-3295 OT Time Calculation (min): 35 min  OT Assessment / Plan / Recommendation Clinical Impression    This 77 y.o. Male with h/o COPD and pulmonary fibrosis on chronic 02 (6-8L) at home, admitted progressive dyspnea and fall. Pt sustained Lt. Foot injury with the fall, but x-ray ruled out fracture per pt.  Pt. With extremely limited activity tolerance with 02 sats dropping to 77% when pt donned Lt. Sock on EOB.  Despite pursed lip breathing, pt unable to increase sats only to 86% on venti mask with 50% FIO2 while sitting unsupported EOB.  Once pt moved to recliner with back support and deep breathing sats increased to 94%.  Pt with poor safety awareness which appears to be his baseline.  Recommend 24 hour assistance at discharge, which son is trying to arrange.  Pt will refuse SNF.    OT Assessment  Patient needs continued OT Services    Follow Up Recommendations  Home health OT;Supervision/Assistance - 24 hour (pt is refusing SNF)    Barriers to Discharge Decreased caregiver support son trying to arrange 24 hour care  Equipment Recommendations  3 in 1 bedside comode;Hospital bed (unsure that pt will agree)    Recommendations for Other Services    Frequency  Min 2X/week    Precautions / Restrictions Precautions Precautions: Fall Precaution Comments: Newly injured Lt. foot, and pt with high 02 requirements Restrictions Weight Bearing Restrictions: No Other Position/Activity Restrictions: Pt has a walking boot from previous foot fracture       ADL  Eating/Feeding: Independent Where Assessed - Eating/Feeding: Chair Grooming: Wash/dry hands;Wash/dry face;Teeth care;Supervision/safety Where Assessed - Grooming: Supported sitting Upper Body Bathing: Minimal assistance Where Assessed - Upper Body Bathing: Supported sitting Lower Body Bathing:  Maximal assistance Where Assessed - Lower Body Bathing: Supported sit to stand Upper Body Dressing: Minimal assistance Where Assessed - Upper Body Dressing: Unsupported sitting Lower Body Dressing: Maximal assistance Where Assessed - Lower Body Dressing: Supported sit to Pharmacist, hospital: Minimal Dentist Method: Surveyor, minerals: Materials engineer and Hygiene: Moderate assistance Where Assessed - Toileting Clothing Manipulation and Hygiene: Standing Equipment Used: Other (comment) (02) Transfers/Ambulation Related to ADLs: Pt performed stand pivot transfer with mod A.  Requires cues and assist for  ADL Comments: Pt donned Rt. sock sitting EOB with sats dropping to 77% on venti mask with 50% FIO2, and DOE 4/4, although pt denied SOB afterwards.  Discussed need for 24 hour assistance vs. SNF with pt and son.  Pt will refuse SNF'; son is trying to arrange 24/7 assist at pt's home.    OT Diagnosis: Generalized weakness  OT Problem List: Decreased strength;Decreased activity tolerance;Impaired balance (sitting and/or standing);Decreased safety awareness;Decreased knowledge of use of DME or AE;Cardiopulmonary status limiting activity;Pain OT Treatment Interventions: Self-care/ADL training;DME and/or AE instruction;Therapeutic activities;Balance training;Patient/family education   OT Goals Acute Rehab OT Goals OT Goal Formulation: With patient/family Time For Goal Achievement: 01/30/13 Potential to Achieve Goals: Fair ADL Goals Pt Will Perform Upper Body Bathing: with supervision;Sitting, chair;Sitting, edge of bed ADL Goal: Upper Body Bathing - Progress: Goal set today Pt Will Perform Lower Body Bathing: with min assist;Sit to stand from bed;Sit to stand in shower ADL Goal: Lower Body Bathing - Progress: Goal set today Pt Will Perform Upper Body Dressing: with supervision;with set-up;Sitting, chair;Sitting, bed ADL  Goal: Upper Body Dressing - Progress:  Goal set today Pt Will Perform Lower Body Dressing: with min assist;Sit to stand from chair;Sit to stand from bed ADL Goal: Lower Body Dressing - Progress: Goal set today Pt Will Transfer to Toilet: with supervision;Stand pivot transfer;3-in-1;Ambulation ADL Goal: Toilet Transfer - Progress: Goal set today Pt Will Perform Toileting - Clothing Manipulation: with supervision;Standing ADL Goal: Toileting - Clothing Manipulation - Progress: Goal set today Additional ADL Goal #1: Pt will participate in 25 mins therapeutic activity with no more than 4 rest breaks and sats 88% or greater ADL Goal: Additional Goal #1 - Progress: Goal set today  Visit Information  Last OT Received On: 01/16/13 Assistance Needed: +2 (to progress activity) PT/OT Co-Evaluation/Treatment: Yes    Subjective Data  Subjective: "I wasn't breathing that hard" when sats decreased to 77% Patient Stated Goal: To go home   Prior Functioning     Home Living Lives With: Alone Available Help at Discharge: Friend(s);Family;Available 24 hours/day Type of Home: House Home Access: Stairs to enter [employer] of Steps: 4 Entrance Stairs-Rails: None Home Layout: One level Bathroom Shower/Tub: Health visitor: Standard Bathroom Accessibility: Yes How Accessible: Accessible via walker Home Adaptive Equipment: Shower chair without back;Walker - rolling;Walker - standard;Wheelchair - manual Prior Function Level of Independence: Needs assistance Needs Assistance: Meal Prep;Light Housekeeping Meal Prep: Maximal Light Housekeeping: Total Able to Take Stairs?: Yes Driving: Yes Vocation: Retired Comments: Pt reports 02 has quit working x 2, and he then passes out.   He reports 2 falls in the last year Communication Communication: No difficulties Dominant Hand: Right         Vision/Perception     Cognition  Cognition Arousal/Alertness:  Awake/alert Behavior During Therapy: WFL for tasks assessed/performed Overall Cognitive Status: Within Functional Limits for tasks assessed (pt with decreased judgement which is his baseline)    Extremity/Trunk Assessment Right Upper Extremity Assessment RUE ROM/Strength/Tone: Within functional levels RUE Coordination: WFL - gross/fine motor Left Upper Extremity Assessment LUE ROM/Strength/Tone: Within functional levels LUE Coordination: WFL - gross/fine motor Trunk Assessment Trunk Assessment: Normal     Mobility Bed Mobility Bed Mobility: Sitting - Scoot to Edge of Bed;Rolling Right;Right Sidelying to Sit Rolling Right: 5: Supervision;With rail Right Sidelying to Sit: 4: Min assist;With rails Sitting - Scoot to Edge of Bed: 5: Supervision Details for Bed Mobility Assistance: sats dropped to 84% when moving to EOB.  Cues required for technique Transfers Transfers: Sit to Stand;Stand to Sit Sit to Stand: With upper extremity assist;From bed;3: Mod assist Stand to Sit: With upper extremity assist;To chair/3-in-1;3: Mod assist Details for Transfer Assistance: Pt with limited WBing over Lt. foot due to acute injury and pain.  Pt. requires max cues for safety, breathing, and hand placement - pt  impulsive.  Sats decreased to 80% with transfer     Exercise     Balance Balance Balance Assessed: Yes Static Sitting Balance Static Sitting - Balance Support: No upper extremity supported Static Sitting - Level of Assistance: 5: Stand by assistance Dynamic Sitting Balance Dynamic Sitting - Balance Support: No upper extremity supported Dynamic Sitting - Level of Assistance: 5: Stand by assistance Dynamic Sitting - Balance Activities: Other (comment) (donning sock)   End of Session OT - End of Session Activity Tolerance: Treatment limited secondary to medical complications (Comment) (decreased 02 sats) Patient left: in chair;with call bell/phone within reach;with family/visitor  present Nurse Communication: Mobility status;Other (comment) (02)  GO     Jamare Vanatta M 01/16/2013, 2:10 PM

## 2013-01-16 NOTE — Progress Notes (Signed)
[facility] Progress Note [place] TEAM 1 - Stepdown/ICU TEAM   [name] ZOX:[id number] DOB: 03-22-1936 DOA: 01/14/2013 PCP: Lucila [name], MD  Brief narrative: 77 year old male with COPD and pulmonary fibrosis on chronic oxygen between 6-8 L per minute. He also has combined diastolic and systolic heart failure. Last echocardiogram at the end of 2013 measure to improve ejection fraction of 50% with medical management he is also had a dual-chamber ICD placed. He is also on chronic prednisone. He was last seen in the office by [name] in February of this year with dyspnea and hypoxemia felt to be related to progressive pulmonary fibrosis. He was given a burst of steroids and tapered back down to his usual prednisone dose with plans to followup in 2 months. He was last seen by [name] in the office at the beginning of April at which point he was having increasing dyspnea and some increasing lower extremity edema in the setting of recent increase in steroids as noted above. It was also determined that he became extremely short of breath after running out of his oxygen. He had noticed swelling and took extra Lasix which seemed to help with his shortness of breath therefore his Lasix dosage had been increased utilizing an alternating day schedule. He presented to [facility] because of progressive dyspnea and apparent worsening productive cough. CT of the chest was negative for PE and demonstrated stable interstitial and alveolar changes and possibly a new infiltrate in the right mid lung. ABG revealed PO2 of 50 and he was put on 100% nonrebreather mask with improvement in saturations. He was also found to have a white count of 15,000 with a left shift. The patient requests to be transferred to [facility] so he could be cared for by his his cardiology and pulmonology physicians.  Assessment/Plan:  Acute on chronic respiratory failure with hypoxia due to:   A) Pulmonary  fibrosis   B) COPD with emphysema -sx's more c/w recurrent dyspnea due to pulmonary fibrosis -PCT normal  -dc anbx's (CXR and CT chest not c/w PNA). -continue steroids as per home dose -consult pulmonary medicine who follows patient in their clinic -having recurrent issues with functioning of O2 equipment at home- pulmonary has contacted pt's home care service to explore options -avoid high dose oxygen since could precipitate sx hypercarbia  Chronic diastolic and systolic heart failure -compensated at present since pt actually volume depleted 2/2 recent Lasix increase -holding BB and ACEI due to suboptimal BP -hold Lasix 2/2 DH -Cards repeating ECHO this admit  Mild elevation in cardiac enzymes -suspect demand ischemia as noted above -appreciate cards eval  ARF on CKD (chronic kidney disease) stage 3, GFR 30-59 ml/min -due to recent low volume from Lasix -cont gentle IV hydration  Hypovolemia / dehydration -as above  -hold diuretics and cont IVF cautiously  Left foot pain and swelling -endorsed pre admit injury and had walking boot at home -PT/OT eval  CAD -stable and no true ischemic sx's  -cont ASA and Plavix  Diabetes mellitus with peripheral vascular disease -controlled  Hyperkalemia -due to [facility] and ARF -rpt BMET in am  Medtronic dual-chamber ICD   DVT prophylaxis: Subcutaneous heparin Code Status: Full Family Communication: Patient Disposition Plan: SDU Isolation: None  Consultants: Pulmonary Cardiology  Procedures: 2-D echocardiogram pending  Antibiotics: Zithromax 4/16 >>> 4/17 Rocephin 4/16 >>> 4/17  HPI/Subjective: Patient alert and endorses breathing much better. Describes prior issues regarding running out of oxygen and how this affected  his breathing. Currently endorsing left foot and ankle discomfort after injury that occurred prior to admission stating has immobilization boot at home.   Objective: Blood pressure 135/90, pulse 76,  temperature 98.1 F (36.7 C), temperature source Oral, resp. rate 22, height 5\' 6"  (1.676 m), weight 87.1 kg (192 lb 0.3 oz), SpO2 92.00%.  Intake/Output Summary (Last 24 hours) at 01/16/13 1109 Last data filed at 01/16/13 0901  Gross per 24 hour  Intake    440 ml  Output    700 ml  Net   -260 ml     Exam: General: No acute respiratory distress Lungs: Fine crackles throughout all fields with no wheeze, 50% VM, no tachypnea Cardiovascular: Regular rate and rhythm without murmur gallop or rub normal S1 and S2, no peripheral edema (see below) or JVD Abdomen: Nontender, nondistended, soft, bowel sounds positive, no rebound, no ascites, no appreciable mass Musculoskeletal: No significant cyanosis, clubbing of bilateral lower extremities-does have focal edema of left dorsum of foot Neurological: Alert and oriented x 3, moves all extremities x 4 without focal neurological deficits, CN 2-12 intact  Data Reviewed: Basic Metabolic Panel:  Recent Labs Lab 01/14/13 1115 01/15/13 0454 01/15/13 1627 01/16/13 0500  NA  --  143 138 140  K  --  5.8* 5.0 4.6  CL  --  106 107 107  CO2  --  31 22 21   GLUCOSE  --  87 118* 71  BUN  --  27* 24* 22  CREATININE 1.79* 1.91* 1.48* 1.39*  CALCIUM  --  8.9 8.9 8.9  MG 2.1  --   --   --    Liver Function Tests:  Recent Labs Lab 01/15/13 0454 01/16/13 0500  AST 38* 27  ALT 21 18  ALKPHOS 26* 22*  BILITOT 0.4 0.5  PROT 6.2 5.9*  ALBUMIN 2.8* 2.6*   CBC:  Recent Labs Lab 01/14/13 1115 01/15/13 0454 01/16/13 0500  WBC 12.0* 7.7 8.3  HGB 12.8* 11.8* 11.4*  HCT 38.8* 35.8* 34.1*  MCV 89.6 91.6 91.2  PLT 145* 114* 88*   Cardiac Enzymes:  Recent Labs Lab 01/14/13 1100 01/14/13 1653 01/14/13 2217 01/15/13 1300 01/15/13 1626  TROPONINI 0.88* 0.59* 0.32* <0.30 <0.30   BNP (last 3 results)  Recent Labs  03/10/12 1605 01/14/13 1100  PROBNP 238.0* 1684.0*   CBG:  Recent Labs Lab 01/15/13 1307 01/15/13 1757 01/15/13 2138  01/16/13 0814 01/16/13 0901  GLUCAP 70 159* 109* 68* 88    Recent Results (from the past 240 hour(s))  CULTURE, BLOOD (ROUTINE X 2)     Status: None   Collection Time    01/14/13 11:00 AM      Result Value Range Status   Specimen Description BLOOD RIGHT ARM   Final   Special Requests BOTTLES DRAWN AEROBIC ONLY 7CC   Final   Culture  Setup Time 01/14/2013 14:31   Final   Culture     Final   Value:        BLOOD CULTURE RECEIVED NO GROWTH TO DATE CULTURE WILL BE HELD FOR 5 DAYS BEFORE ISSUING A FINAL NEGATIVE REPORT   Report Status PENDING   Incomplete  CULTURE, BLOOD (ROUTINE X 2)     Status: None   Collection Time    01/14/13 11:15 AM      Result Value Range Status   Specimen Description BLOOD RIGHT THUMB   Final   Special Requests BOTTLES DRAWN AEROBIC ONLY 6CC   Final   Culture  Setup Time 01/14/2013 14:31   Final   Culture     Final   Value:        BLOOD CULTURE RECEIVED NO GROWTH TO DATE CULTURE WILL BE HELD FOR 5 DAYS BEFORE ISSUING A FINAL NEGATIVE REPORT   Report Status PENDING   Incomplete  MRSA PCR SCREENING     Status: None   Collection Time    01/14/13 11:55 AM      Result Value Range Status   MRSA by PCR NEGATIVE  NEGATIVE Final   Comment:            The GeneXpert MRSA Assay (FDA     approved for NASAL specimens     only), is one component of a     comprehensive MRSA colonization     surveillance program. It is not     intended to diagnose MRSA     infection nor to guide or     monitor treatment for     MRSA infections.     Studies:  Recent x-ray studies have been reviewed in detail by the Attending Physician  Scheduled Meds:  Reviewed in detail by the Attending Physician   Junious Silk, ANP [facility] Office  [phone] Pager [phone]  On-Call/Text Page:      [email or web address]      password [email or web address]  If 7PM-7AM, please contact night-coverage [email or web address] Password [email or web address] 01/16/2013, 11:09 AM   LOS: 2 days   I have personally examined  this patient and reviewed the entire database. I have reviewed the above note, made any necessary editorial changes, and agree with its content.  Lonia Blood, MD [facility]

## 2013-01-16 NOTE — Progress Notes (Signed)
Patient Name: [name]      SUBJECTIVE: admitted from [facility] with weakness and COPD exacerbation with hypoxemia Complex CAD with CABG and redoX2 last in 2007 with last cath in 2010>>Med therapy +Tn--trivial  prob mismatch  Feeling much better  " want to go home"  Past Medical History  Diagnosis Date  . CAD (coronary artery disease)     s/p CABG; s/p redo CABG in 1997 and 2007; cath 4/10: LM occluded, CFX occluded, old S-RCA occluded, old S-RCA 90%, left radial to RCA ok, S-CFX ok, L-LAD ok, 2 old SVGs stumped  . Hypertension   . Hyperlipidemia   . Ischemic cardiomyopathy     echo 7/12: EF 45-50%, severe LVH, mild LAE; EF 50% per echo 2/13  . Chronic systolic heart failure     EF is 24% in the past; EF 45-50% in 7/12 and up to 50% in Feb 2013  . Carotid stenosis     doppler 11/11: 20-39% bilat.  . ICD (implantable cardiac defibrillator) in place 2010  . Hypertensive heart disease   . COPD (chronic obstructive pulmonary disease)   . History of stroke   . Hiatal hernia   . AAA (abdominal aortic aneurysm)     followed by [facility]  . Pulmonary fibrosis     followed by [name]; on chronic oxygen.   . Prolonged grief reaction   . Diabetes mellitus, insulin dependent (IDDM), controlled   . CHF (congestive heart failure)   . Myocardial infarction   . Stroke   . Shortness of breath     on home O2  . Rectal bleed February 2014    PHYSICAL EXAM Filed Vitals:   01/16/13 0200 01/16/13 0300 01/16/13 0400 01/16/13 0500  BP: 139/90 123/72 125/78 119/72  Pulse: 73 75 76 71  Temp:    97.9 F (36.6 C)  TempSrc:    Oral  Resp: 21 28 20 26   Height:      Weight:    192 lb 0.3 oz (87.1 kg)  SpO2: 93% 88% 96% 91%   Well developed and nourished in no acute distress wearing O2 HENT normal Neck supple Significant B wheezes Some irregular rate and rhythm, no murmurs or gallops Abd-soft with active BS No Clubbing cyanosis edema Skin-warm and dry A & Oriented  Grossly normal sensory  and motor function  TELEMETRY: Reviewed telemetry pt in sinus with PAC    Intake/Output Summary (Last 24 hours) at 01/16/13 0803 Last data filed at 01/16/13 0500  Gross per 24 hour  Intake    350 ml  Output   1100 ml  Net   -750 ml    LABS: Basic Metabolic Panel:  Recent Labs Lab 01/14/13 1115  01/15/13 0454 01/15/13 1627 01/16/13 0500  NA  --   --  143 138 140  K  --   --  5.8* 5.0 4.6  CL  --   --  106 107 107  CO2  --   --  31 22 21   GLUCOSE  --   --  87 118* 71  BUN  --   --  27* 24* 22  CREATININE 1.79*  --  1.91* 1.48* 1.39*  CALCIUM  --   < > 8.9 8.9 8.9  MG 2.1  --   --   --   --   < > = values in this interval not displayed. Cardiac Enzymes:  Recent Labs  01/14/13 2217 01/15/13 1300 01/15/13 1626  TROPONINI 0.32* <  0.30 <0.30   CBC:  Recent Labs Lab 01/14/13 1115 01/15/13 0454 01/16/13 0500  WBC 12.0* 7.7 8.3  HGB 12.8* 11.8* 11.4*  HCT 38.8* 35.8* 34.1*  MCV 89.6 91.6 91.2  PLT 145* 114* 88*   PROTIME: No results found for this basename: LABPROT, INR,  in the last 72 hours Liver Function Tests:  Recent Labs  01/15/13 0454 01/16/13 0500  AST 38* 27  ALT 21 18  ALKPHOS 26* 22*  BILITOT 0.4 0.5  PROT 6.2 5.9*  ALBUMIN 2.8* 2.6*   No results found for this basename: LIPASE, AMYLASE,  in the last 72 hours BNP: BNP (last 3 results)  Recent Labs  03/10/12 1605 01/14/13 1100  PROBNP 238.0* 1684.0*   D-Dimer: No results found for this basename: DDIMER,  in the last 72 hours Hemoglobin A1C:  Recent Labs  01/14/13 1115  HGBA1C 7.0*   Fasting Lipid Panel: No results found for this basename: CHOL, HDL, LDLCALC, TRIG, CHOLHDL, LDLDIRECT,  in the last 72 hours Thyroid Function Tests:  Recent Labs  01/14/13 1115  TSH 2.255     ASSESSMENT AND PLAN:  Active Problems:   Chronic diastolic and systolic heart failure   Medtronic dual-chamber ICD   Pulmonary fibrosis   COPD with emphysema   CAD   Diabetes mellitus with  peripheral vascular disease   CAP (community acquired pneumonia)   Acute and chronic respiratory failure with hypoxia   Leukocytosis, unspecified   AKI (acute kidney injury)   Hyperkalemia   CKD (chronic kidney disease) stage 3, GFR 30-59 ml/min   Hypovolemia dehydration  Will interrogate ICD no further cardiac workup at this time continue current meds  Signed, Sherryl Manges MD  01/16/2013

## 2013-01-16 NOTE — Progress Notes (Signed)
RT found patient on 6LNC, patient's 02 satruations were 73%, placed patient back on 50% venturi mask, 02 saturations came back up to 91%.

## 2013-01-16 NOTE — Progress Notes (Signed)
Physical Therapy Evaluation Note  Past Medical History  Diagnosis Date  . CAD (coronary artery disease)     s/p CABG; s/p redo CABG in 1997 and 2007; cath 4/10: LM occluded, CFX occluded, old S-RCA occluded, old S-RCA 90%, left radial to RCA ok, S-CFX ok, L-LAD ok, 2 old SVGs stumped  . Hypertension   . Hyperlipidemia   . Ischemic cardiomyopathy     echo 7/12: EF 45-50%, severe LVH, mild LAE; EF 50% per echo 2/13  . Chronic systolic heart failure     EF is 24% in the past; EF 45-50% in 7/12 and up to 50% in Feb 2013  . Carotid stenosis     doppler 11/11: 20-39% bilat.  . ICD (implantable cardiac defibrillator) in place 2010  . Hypertensive heart disease   . COPD (chronic obstructive pulmonary disease)   . History of stroke   . Hiatal hernia   . AAA (abdominal aortic aneurysm)     followed by [facility]  . Pulmonary fibrosis     followed by [name]; on chronic oxygen.   . Prolonged grief reaction   . Diabetes mellitus, insulin dependent (IDDM), controlled   . CHF (congestive heart failure)   . Myocardial infarction   . Stroke   . Shortness of breath     on home O2  . Rectal bleed February 2014    Past Surgical History  Procedure Laterality Date  . Coronary artery bypass graft  2007    redo CABG x2 -- at that time had left radial artery graft to the distal right coronary and saphenous vein graft to the distal circumflex.   . Coronary artery bypass graft  1997    redo surgery x2 --  in January 18, 1996, and at that time had a vein graft to the diagonal and OM and a vein graft to the posterior descending and  posterolateral branches   . Coronary artery bypass graft  1992    x2 -- left internal mammary to the LAD, a single vein graft to the diagonal - obtuse marginal system  and posterior descending  . Insert / replace / remove pacemaker  01/29/2009    Medtronic Virtuoso II DR four chamber AICD  . Cardiac catheterization  01/07/2009     performed by Dr. Deborah Chalk on January 07, 2009  revealing medically manageable coronary artery disease with reduced EF at 30%   . Hernia repair  1998  . Prostate test  June 2013  . Exploration post operative open heart  2007  . Abdominal aortic aneurysm repair  07/16/2012    01/16/13 1201  PT Visit Information  Last PT Received On 01/16/13  Assistance Needed +2  PT/OT Co-Evaluation/Treatment Yes  PT Time Calculation  PT Start Time 1201  PT Stop Time 1229  PT Time Calculation (min) 28 min  Subjective Data  Subjective Pt received supine in bed on venti-mask at 50%. pt agreeable to PT  Precautions  Precautions Fall  Precaution Comments recent injury to left foot, increased O2 requirements  Restrictions  Weight Bearing Restrictions Yes  LLE Weight Bearing WBAT  Other Position/Activity Restrictions Pt has walking boot at home, pt reports it not to be broken  Home Living  Lives With Alone  Available Help at Discharge Friend(s);Family;Available 24 hours/day  Type of Home House  Home Access Stairs to enter  Entrance Stairs-Number of Steps 4  Entrance Stairs-Rails None  Home Layout One level  Printmaker  Bathroom Accessibility Yes  How Accessible Accessible via walker  Home Adaptive Equipment Shower chair without back;Walker - rolling;Walker - standard;Wheelchair - manual  Prior Function  Level of Independence Needs assistance  Needs Assistance Meal Prep;Light Housekeeping  Meal Prep Maximal  Light Housekeeping Total  Able to Take Stairs? Yes  Driving Yes  Vocation Retired  Comments pt reports his concentrator to be dysfunctional and it causes him to pass out because he runs out of oxygen. pt has had 2 falls in the last 6 months due to this problem  Communication  Communication No difficulties  Cognition  Arousal/Alertness Awake/alert  Behavior During Therapy WFL for tasks assessed/performed  Overall Cognitive Status Within Functional Limits for tasks assessed (pt with  decreased judgement which is his baseline)  Right Upper Extremity Assessment  RUE ROM/Strength/Tone WFL  RUE Coordination WFL - gross/fine motor  Left Upper Extremity Assessment  LUE ROM/Strength/Tone WFL  LUE Coordination WFL - gross/fine motor  Right Lower Extremity Assessment  RLE ROM/Strength/Tone WFL  Left Lower Extremity Assessment  LLE ROM/Strength/Tone Deficits  LLE ROM/Strength/Tone Deficits hip and knee WFL, ankle limited by pain. L foot black and blue  Trunk Assessment  Trunk Assessment Normal  Bed Mobility  Bed Mobility Sitting - Scoot to Edge of Bed;Rolling Right;Right Sidelying to Sit  Rolling Right 5: Supervision;With rail  Right Sidelying to Sit 4: Min assist;With rails  Sitting - Scoot to Edge of Bed 5: Supervision  Details for Bed Mobility Assistance sats dropped to 84% when moving to EOB.  Cues required for technique  Transfers  Transfers Sit to Stand;Stand to Sit;Stand Pivot Transfers  Sit to Stand With upper extremity assist;From bed;3: Mod assist  Stand to Sit With upper extremity assist;To chair/3-in-1;3: Mod assist  Stand Pivot Transfers 3: Mod assist  Details for Transfer Assistance Pt with limited WBing over Lt. foot due to acute injury and pain.  Pt. requires max cues for safety, breathing, and hand placement - pt  impulsive.  Sats decreased to 80% with transfer  Ambulation/Gait  Ambulation/Gait Assistance Not tested (comment) (pt unable to weight-bear through L LE)  Wheelchair Mobility  Wheelchair Mobility No  Balance  Balance Assessed Yes  Static Sitting Balance  Static Sitting - Balance Support No upper extremity supported  Static Sitting - Level of Assistance 5: Stand by assistance  Static Sitting - Comment/# of Minutes 8  Dynamic Sitting Balance  Dynamic Sitting - Balance Support No upper extremity supported  Dynamic Sitting - Level of Assistance 5: Stand by assistance  Dynamic Sitting - Balance Activities Other (comment) (donning sock)  PT -  End of Session  Equipment Utilized During Treatment Gait belt;Oxygen (venti mask at 50%)  Activity Tolerance (limited by SOB, decreased SpO2 with mobility)  Patient left in chair;with call bell/phone within reach;with family/visitor present  Nurse Communication Mobility status (SpO2 with mobility)  PT Assessment  Clinical Impression Statement Pt is a 77 yo male admitted with SOB and recent fall resulting in badly bruised L foot. Pt mobility extremely limited by SOB, dec SpO2 into 70's, and decreased L LE WBing tolerance. Pt unsafe to return home alone due to patient requiring mod/maxA for all transfers and ADLs and patient's poor safety awareness. Pt requires 24/7 supervision, use of RW and HHPT for safe d/c home. Son reports he is working on a plan to have someone stay with his father 24/7. Pt will refuse SNF.  PT Recommendation/Assessment Patient needs continued PT services  PT Problem List Decreased strength;Decreased balance;Decreased  mobility;Decreased safety awareness;Cardiopulmonary status limiting activity;Pain  Barriers to Discharge (pt lives alone but knows he needs assist)  PT Therapy Diagnosis  Difficulty walking  PT Plan  PT Frequency Min 3X/week  PT Treatment/Interventions Gait training;Functional mobility training;Therapeutic activities;Therapeutic exercise;Balance training  PT Recommendation  Follow Up Recommendations Home health PT;Supervision/Assistance - 24 hour  PT equipment None recommended by PT (pt has RW)  Individuals Consulted  Consulted and Agree with Results and Recommendations Patient;Family member/caregiver  Family Member Consulted son  Acute Rehab PT Goals  PT Goal Formulation With patient/family  Time For Goal Achievement 01/30/13  Potential to Achieve Goals Good  Pt will go Supine/Side to Sit with modified independence;with HOB 0 degrees  PT Goal: Supine/Side to Sit - Progress Goal set today  Pt will go Sit to Supine/Side with modified independence;with  HOB 0 degrees  PT Goal: Sit to Supine/Side - Progress Goal set today  Pt will Transfer Bed to Chair/Chair to Bed with supervision (with RW)  PT Transfer Goal: Bed to Chair/Chair to Bed - Progress Goal set today  Pt will Ambulate 16 - 50 feet;with min assist;with rolling walker  PT Goal: Ambulate - Progress Goal set today  Pt will Go Up / Down Stairs 3-5 stairs;with min assist;with rail(s)  PT Goal: Up/Down Stairs - Progress Goal set today  PT General Charges  $$ ACUTE PT VISIT 1 Procedure  PT Evaluation  $Initial PT Evaluation Tier I 1 Procedure  PT Treatments  $Therapeutic Activity 8-22 mins  Written Expression  Dominant Hand Right   Pain: L foot, did not rate  Lewis Shock, PT, DPT Pager #: [phone] Office #: [phone]

## 2013-01-17 DIAGNOSIS — I4891 Unspecified atrial fibrillation: Secondary | ICD-10-CM

## 2013-01-17 DIAGNOSIS — J438 Other emphysema: Secondary | ICD-10-CM

## 2013-01-17 DIAGNOSIS — I5041 Acute combined systolic (congestive) and diastolic (congestive) heart failure: Principal | ICD-10-CM

## 2013-01-17 DIAGNOSIS — Z95810 Presence of automatic (implantable) cardiac defibrillator: Secondary | ICD-10-CM

## 2013-01-17 DIAGNOSIS — I509 Heart failure, unspecified: Secondary | ICD-10-CM

## 2013-01-17 LAB — BASIC METABOLIC PANEL
BUN: 19 mg/dL (ref 6–23)
Calcium: 9.3 mg/dL (ref 8.4–10.5)
GFR calc non Af Amer: 55 mL/min — ABNORMAL LOW (ref >90)
Glucose, Bld: 102 mg/dL — ABNORMAL HIGH (ref 70–99)
Sodium: 140 mEq/L (ref 135–145)

## 2013-01-17 LAB — GLUCOSE, CAPILLARY
Glucose-Capillary: 91 mg/dL (ref 70–99)
Glucose-Capillary: 129 mg/dL — ABNORMAL HIGH (ref 70–99)
Glucose-Capillary: 141 mg/dL — ABNORMAL HIGH (ref 70–99)
Glucose-Capillary: 92 mg/dL (ref 70–99)

## 2013-01-17 MED ORDER — BIOTENE DRY MOUTH MT LIQD
15.0000 mL | Freq: Two times a day (BID) | OROMUCOSAL | Status: DC
  Administered 2013-01-17 – 2013-01-21 (×6): 15 mL via OROMUCOSAL

## 2013-01-17 MED ORDER — NATEGLINIDE 60 MG PO TABS
60.0000 mg | ORAL_TABLET | Freq: Every day | ORAL | Status: DC | PRN
  Filled 2013-01-17: qty 1

## 2013-01-17 MED ORDER — NEBIVOLOL HCL 2.5 MG PO TABS
2.5000 mg | ORAL_TABLET | Freq: Every day | ORAL | Status: DC
  Administered 2013-01-17 – 2013-01-21 (×5): 2.5 mg via ORAL
  Filled 2013-01-17 (×5): qty 1

## 2013-01-17 NOTE — Progress Notes (Signed)
PULMONARY  / CRITICAL CARE MEDICINE  Name: [name] MRN: [id number] DOB: September 20, 1936    ADMISSION DATE:  01/14/2013 CONSULTATION DATE:  01/15/13  REFERRING MD :  Dr. Sharon Seller PRIMARY SERVICE:  TRH  CHIEF COMPLAINT:  Fall / AMS / Hypoxia  BRIEF PATIENT DESCRIPTION: 77 y/o M admitted to [facility] on 4/16 PM from [facility] after his oxygen "ran out" and blacked out at home, falling (striking foot).  Work up at [facility] included at CT of chest which was negative for PE but had R midlung infiltrate, ABG with PaO2 of 50, increased WBC.    SIGNIFICANT EVENTS / STUDIES:    LINES / TUBES:   CULTURES: 4/16 BCx2>>> 4/16 Sputum>>>  ANTIBIOTICS:   HISTORY OF PRESENT ILLNESS:  77 y/o M, with PMH of COPD and pulmonary fibrosis on chronic oxygen between 6-8 L per minute, combined diastolic / systolic CHF (EF of 50% in 2013), AICD, HTN, HLD, and CVA.  Last seen by [name] in 11/2012 for Pulmonary fibrosis flare and was treated with a burst of steroids with slow taper back to 10 mg daily.  During this period, he also had an exacerbation of CHF with lasix adjustment per [name].    He presented to [facility] and was transferred to [facility] on 4/16 PM after his oxygen "ran out" and blacked out at home, falling (striking foot).  Work up at [facility] included at CT of chest which was negative for PE but had R midlung infiltrate, ABG with PaO2 of 50, increased WBC.  He is very difficult to question regarding pulmonary symptoms as he only wants to discuss how the machines are not working properly.  He becomes visibly upset in talking about the particulars of the O2 delivery machines.  He relays several episodes of his "oxygen becoming clogged with pressure" and running out and feels that this is the reason his lungs are worsening.   He currently denies new or increased shortness of breath, cough, fevers, chills in the week prior to admit.     Past Medical History  Diagnosis Date  . CAD (coronary  artery disease)     s/p CABG; s/p redo CABG in 1997 and 2007; cath 4/10: LM occluded, CFX occluded, old S-RCA occluded, old S-RCA 90%, left radial to RCA ok, S-CFX ok, L-LAD ok, 2 old SVGs stumped  . Hypertension   . Hyperlipidemia   . Ischemic cardiomyopathy     echo 7/12: EF 45-50%, severe LVH, mild LAE; EF 50% per echo 2/13  . Chronic systolic heart failure     EF is 24% in the past; EF 45-50% in 7/12 and up to 50% in Feb 2013  . Carotid stenosis     doppler 11/11: 20-39% bilat.  . ICD (implantable cardiac defibrillator) in place 2010  . Hypertensive heart disease   . COPD (chronic obstructive pulmonary disease)   . History of stroke   . Hiatal hernia   . AAA (abdominal aortic aneurysm)     followed by [facility]  . Pulmonary fibrosis     followed by [name]; on chronic oxygen.   . Prolonged grief reaction   . Diabetes mellitus, insulin dependent (IDDM), controlled   . CHF (congestive heart failure)   . Myocardial infarction   . Stroke   . Shortness of breath     on home O2  . Rectal bleed February 2014    Past Surgical History  Procedure Laterality Date  . Coronary artery bypass graft  2007    redo CABG x2 -- at that time had left radial artery graft to the distal right coronary and saphenous vein graft to the distal circumflex.   . Coronary artery bypass graft  1997    redo surgery x2 --  in January 18, 1996, and at that time had a vein graft to the diagonal and OM and a vein graft to the posterior descending and  posterolateral branches   . Coronary artery bypass graft  1992    x2 -- left internal mammary to the LAD, a single vein graft to the diagonal - obtuse marginal system  and posterior descending  . Insert / replace / remove pacemaker  01/29/2009    Medtronic Virtuoso II DR four chamber AICD  . Cardiac catheterization  01/07/2009     performed by Dr. Deborah Chalk on January 07, 2009 revealing medically manageable coronary artery disease with reduced EF at 30%   . Hernia repair   1998  . Prostate test  June 2013  . Exploration post operative open heart  2007  . Abdominal aortic aneurysm repair  07/16/2012   MEDS: . antiseptic oral rinse  15 mL Mouth Rinse BID  . aspirin EC  81 mg Oral Daily  . atorvastatin  40 mg Oral q1800  . ezetimibe  10 mg Oral Daily  . FLUoxetine  40 mg Oral Daily  . heparin  5,000 Units Subcutaneous Q8H  . insulin aspart  0-15 Units Subcutaneous TID WC  . insulin aspart  3 Units Subcutaneous TID WC  . insulin detemir  20 Units Subcutaneous Daily  . mometasone-formoterol  2 puff Inhalation BID  . multivitamin with minerals  1 tablet Oral Daily  . pantoprazole  40 mg Oral BID  . potassium chloride SA  20 mEq Oral Daily  . predniSONE  10 mg Oral Q breakfast  . sodium chloride  3 mL Intravenous Q12H  . tiotropium  18 mcg Inhalation Daily    Allergies  Allergen Reactions  . Sulfa Antibiotics Nausea And Vomiting    This occurred when pt was 77 years old     SUBJECTIVE: RN reports periods of desaturations with activity.  VITAL SIGNS: Temp:  [97.8 F (36.6 C)-98.7 F (37.1 C)] 97.8 F (36.6 C) (04/19 0800) Pulse Rate:  [70-89] 73 (04/19 0824) Resp:  [19-29] 19 (04/19 0824) BP: (119-149)/(67-106) 134/91 mmHg (04/19 0824) SpO2:  [88 %-97 %] 90 % (04/19 0847) FiO2 (%):  [50 %] 50 % (04/19 0847) Weight:  [83.3 kg (183 lb 10.3 oz)] 83.3 kg (183 lb 10.3 oz) (04/19 0427)  PHYSICAL EXAMINATION: General:  Chronically ill elderly male in NAD Neuro:  AAOx4, speech clear, MAE HEENT:  Mm pink / moist, no jvd Cardiovascular:  s1s2 rrr, distant Lungs:  resp's even/non-labored at rest, lungs bilaterally with fine crackles Abdomen:  Obese/soft, bsx4 active Musculoskeletal:  No acute deformities, LLE with bruising s/p fall (appears old) Skin:  Warm/dry, minimal LE edema   Recent Labs Lab 01/15/13 1627 01/16/13 0500 01/17/13 0510  NA 138 140 140  K 5.0 4.6 4.6  CL 107 107 105  CO2 22 21 25   BUN 24* 22 19  CREATININE 1.48* 1.39*  1.25  GLUCOSE 118* 71 102*    Recent Labs Lab 01/14/13 1115 01/15/13 0454 01/16/13 0500  HGB 12.8* 11.8* 11.4*  HCT 38.8* 35.8* 34.1*  WBC 12.0* 7.7 8.3  PLT 145* 114* 88*   IMAGING:  CXR 4/16 showed cardiomeg, s/p CABG, AICD; pulm fibrosis,  NAD.Marland KitchenMarland Kitchen   ASSESSMENT / PLAN:  Pulmonary Fibrosis on chronic O2 - wears 6L at rest and 8L with activity COPD - without evidence of acute exacerbation  Plan: -O2 to support sats >88% => currently sating at 90% on 50% mask O2 -investigate home O2 set up.  Would like to have [employer] go to his house and re-instruct him on how to change humidity bottle and use equipment  -discussed oxygen delivery method with [facility] Rep in hospital and he relays concerns that patient may be overfilling the humidity bottle which may contribute to no oxygen delivery -continue dulera, prednisone, spiriva -he needs a better support system in place for home to help him with care, recommend home safety assessment.  He notes stories of being in the dark and could figure out how to turn on the oxygen when the power was out.      Veona Bittman M 01/17/2013, 10:39 AM

## 2013-01-17 NOTE — Progress Notes (Signed)
Patient ID: [name], male   DOB: [birth year], 77 y.o.   MRN: [id number] Dr. Odessa Fleming note from yesterday reviewed.  No further cardiac workup recommended.  We will check back on Monday unless I am paged for questions over weekend.   Marca Ancona 01/17/2013 11:19 AM

## 2013-01-17 NOTE — Progress Notes (Signed)
[facility] Progress Note [name] TEAM 1 - Stepdown/ICU TEAM   [name] ZOX:[id number] DOB: 04/22/1936 DOA: 01/14/2013 PCP: Lucila [name], MD  Brief narrative: 77 year old male with COPD and pulmonary fibrosis on chronic oxygen between 6-8 L per minute. He also has combined diastolic and systolic heart failure. Last echocardiogram at the end of 2013 revealed improved ejection fraction of 50%.  He had a dual-chamber ICD placed. He is on chronic prednisone. He was last seen in the office by [name] in February of this year with dyspnea and hypoxemia felt to be related to progressive pulmonary fibrosis. He was given a burst of steroids and tapered back down to his usual prednisone dose with plans to followup in 2 months. He was seen by [name] in the office at the beginning of April at which point he was having increasing dyspnea and some increasing lower extremity edema in the setting of recent increase in steroids as noted above. He had noticed swelling and took extra Lasix which seemed to help with his shortness of breath therefore his Lasix dosage had been increased utilizing an alternating day schedule.   He presented to [facility] because of progressive dyspnea and apparent worsening productive cough. CT of the chest was negative for PE and demonstrated stable interstitial and alveolar changes. ABG revealed PO2 of 50 and he was put on 100% nonrebreather mask with improvement in saturations. The patient requested to be transferred to [facility] so he could be cared for by his his cardiology and pulmonology physicians.  Assessment/Plan:  Acute on chronic respiratory failure with hypoxia due to:   A) Pulmonary fibrosis   B) COPD with emphysema -sx's more c/w recurrent dyspnea due to pulmonary fibrosis -PCT normal  -dc anbx's (CXR and CT chest not c/w PNA) -continue steroids as per home dose -having recurrent issues with functioning of O2 equipment at home -  pulmonary has contacted pt's home care service to explore options -avoid high dose oxygen since could precipitate sx hypercarbia  Chronic diastolic and systolic heart failure -compensated at present since pt actually volume depleted 2/2 recent Lasix increase -Slowly begin to resume home medications and follow closely  Mild elevation in cardiac enzymes -suspect demand ischemia as noted above -appreciate cards eval - no further workup felt to be indicated  ARF on CKD (chronic kidney disease) stage 3, GFR 30-59 ml/min -due to recent low volume from Lasix -Much improved with gentle hydration  Hypovolemia / dehydration -Resolved -held diuretics and gave IVF cautiously  Left foot pain and swelling -endorsed pre admit injury and had walking boot at home -PT/OT following  CAD -stable and no true ischemic sx's  -cont ASA and Plavix  Diabetes mellitus with peripheral vascular disease -controlled  Hyperkalemia -due to [place] and ARF -Resolved  Medtronic dual-chamber ICD   DVT prophylaxis: Subcutaneous heparin Code Status: Full Family Communication: Patient Disposition Plan: SDU Isolation: None  Consultants: Pulmonary Cardiology  Procedures: 4/17 - TTE - ejection fraction 45-50% with grade 1 diastolic dysfunction  Antibiotics: Zithromax 4/16 >>> 4/17 Rocephin 4/16 >>> 4/17  HPI/Subjective: Patient states that he feels much better.  He denies chest pain nausea vomiting or current shortness of breath.  He is quite anxious to be discharged home.   Objective: Blood pressure 149/73, pulse 71, temperature 97.9 F (36.6 C), temperature source Oral, resp. rate 21, height 5\' 6"  (1.676 m), weight 83.3 kg (183 lb 10.3 oz), SpO2 94.00%.  Intake/Output Summary (Last 24 hours) at 01/17/13  1740 Last data filed at 01/17/13 1600  Gross per 24 hour  Intake    400 ml  Output    600 ml  Net   -200 ml     Exam: General: No acute respiratory distress on Venturi mask Lungs: Fine  crackles throughout all fields with no wheeze, 50% VM, no tachypnea Cardiovascular: Regular rate and rhythm without murmur gallop or rub  Abdomen: Nontender, nondistended, soft, bowel sounds positive, no rebound, no ascites, no appreciable mass Musculoskeletal: No significant cyanosis, clubbing of bilateral lower extremities Neurological: Alert and oriented x 3, moves all extremities x 4 without focal neurological deficits  Data Reviewed: Basic Metabolic Panel:  Recent Labs Lab 01/14/13 1115 01/15/13 0454 01/15/13 1627 01/16/13 0500 01/17/13 0510  NA  --  143 138 140 140  K  --  5.8* 5.0 4.6 4.6  CL  --  106 107 107 105  CO2  --  31 22 21 25   GLUCOSE  --  87 118* 71 102*  BUN  --  27* 24* 22 19  CREATININE 1.79* 1.91* 1.48* 1.39* 1.25  CALCIUM  --  8.9 8.9 8.9 9.3  MG 2.1  --   --   --   --    Liver Function Tests:  Recent Labs Lab 01/15/13 0454 01/16/13 0500  AST 38* 27  ALT 21 18  ALKPHOS 26* 22*  BILITOT 0.4 0.5  PROT 6.2 5.9*  ALBUMIN 2.8* 2.6*   CBC:  Recent Labs Lab 01/14/13 1115 01/15/13 0454 01/16/13 0500  WBC 12.0* 7.7 8.3  HGB 12.8* 11.8* 11.4*  HCT 38.8* 35.8* 34.1*  MCV 89.6 91.6 91.2  PLT 145* 114* 88*   Cardiac Enzymes:  Recent Labs Lab 01/14/13 1100 01/14/13 1653 01/14/13 2217 01/15/13 1300 01/15/13 1626  TROPONINI 0.88* 0.59* 0.32* <0.30 <0.30   BNP (last 3 results)  Recent Labs  03/10/12 1605 01/14/13 1100  PROBNP 238.0* 1684.0*   CBG:  Recent Labs Lab 01/16/13 2127 01/17/13 0820 01/17/13 1210 01/17/13 1456 01/17/13 1719  GLUCAP 156* 91 129* 186* 141*    Recent Results (from the past 240 hour(s))  CULTURE, BLOOD (ROUTINE X 2)     Status: None   Collection Time    01/14/13 11:00 AM      Result Value Range Status   Specimen Description BLOOD RIGHT ARM   Final   Special Requests BOTTLES DRAWN AEROBIC ONLY 7CC   Final   Culture  Setup Time 01/14/2013 14:31   Final   Culture     Final   Value:        BLOOD CULTURE  RECEIVED NO GROWTH TO DATE CULTURE WILL BE HELD FOR 5 DAYS BEFORE ISSUING A FINAL NEGATIVE REPORT   Report Status PENDING   Incomplete  CULTURE, BLOOD (ROUTINE X 2)     Status: None   Collection Time    01/14/13 11:15 AM      Result Value Range Status   Specimen Description BLOOD RIGHT THUMB   Final   Special Requests BOTTLES DRAWN AEROBIC ONLY 6CC   Final   Culture  Setup Time 01/14/2013 14:31   Final   Culture     Final   Value:        BLOOD CULTURE RECEIVED NO GROWTH TO DATE CULTURE WILL BE HELD FOR 5 DAYS BEFORE ISSUING A FINAL NEGATIVE REPORT   Report Status PENDING   Incomplete  MRSA PCR SCREENING     Status: None   Collection Time  01/14/13 11:55 AM      Result Value Range Status   MRSA by PCR NEGATIVE  NEGATIVE Final   Comment:            The GeneXpert MRSA Assay (FDA     approved for NASAL specimens     only), is one component of a     comprehensive MRSA colonization     surveillance program. It is not     intended to diagnose MRSA     infection nor to guide or     monitor treatment for     MRSA infections.     Studies:  Recent x-ray studies have been reviewed in detail by the Attending Physician  Scheduled Meds:  Reviewed in detail by the Attending Physician   Lonia Blood, MD [facility] Office  [phone] Pager [phone]  On-Call/Text Page:      [email or web address]      password [email or web address]  If 7PM-7AM, please contact night-coverage [email or web address] Password [email or web address] 01/17/2013, 5:40 PM   LOS: 3 days

## 2013-01-18 LAB — GLUCOSE, CAPILLARY: Glucose-Capillary: 141 mg/dL — ABNORMAL HIGH (ref 70–99)

## 2013-01-18 LAB — CBC
MCH: 29.7 pg (ref 26.0–34.0)
Platelets: 131 10*3/uL — ABNORMAL LOW (ref 150–400)
RBC: 3.4 MIL/uL — ABNORMAL LOW (ref 4.22–5.81)
WBC: 6.7 10*3/uL (ref 4.0–10.5)

## 2013-01-18 LAB — BASIC METABOLIC PANEL
Calcium: 9.4 mg/dL (ref 8.4–10.5)
GFR calc non Af Amer: 49 mL/min — ABNORMAL LOW (ref >90)
Sodium: 139 mEq/L (ref 135–145)

## 2013-01-18 MED ORDER — INSULIN DETEMIR 100 UNIT/ML ~~LOC~~ SOLN
16.0000 [IU] | Freq: Every day | SUBCUTANEOUS | Status: DC
  Administered 2013-01-19 – 2013-01-21 (×3): 16 [IU] via SUBCUTANEOUS
  Filled 2013-01-18 (×3): qty 0.16

## 2013-01-18 MED ORDER — CLOPIDOGREL BISULFATE 75 MG PO TABS: 75.0000 mg | ORAL_TABLET | Freq: Every day | ORAL | Status: DC

## 2013-01-18 MED ORDER — INSULIN ASPART 100 UNIT/ML ~~LOC~~ SOLN
2.0000 [IU] | Freq: Three times a day (TID) | SUBCUTANEOUS | Status: DC
  Administered 2013-01-18 – 2013-01-20 (×7): 2 [IU] via SUBCUTANEOUS

## 2013-01-18 MED ORDER — CLOPIDOGREL BISULFATE 75 MG PO TABS
75.0000 mg | ORAL_TABLET | Freq: Every day | ORAL | Status: DC
  Administered 2013-01-18 – 2013-01-21 (×4): 75 mg via ORAL
  Filled 2013-01-18 (×4): qty 1

## 2013-01-18 NOTE — Progress Notes (Signed)
[facility] Progress Note [place] TEAM 1 - Stepdown/ICU TEAM   [name]:[id number] DOB: 1936/08/12 DOA: 01/14/2013 PCP: Lucila [name], MD  Brief narrative: 77 year old male with COPD and pulmonary fibrosis on chronic oxygen between 6-8 L per minute. He also has combined diastolic and systolic heart failure. Last echocardiogram at the end of 2013 revealed improved ejection fraction of 50%.  He had a dual-chamber ICD placed. He is on chronic prednisone. He was last seen in the office by [name] in February of this year with dyspnea and hypoxemia felt to be related to progressive pulmonary fibrosis. He was given a burst of steroids and tapered back down to his usual prednisone dose with plans to followup in 2 months. He was seen by [name] in the office at the beginning of April at which point he was having increasing dyspnea and some increasing lower extremity edema in the setting of recent increase in steroids as noted above. He had noticed swelling and took extra Lasix which seemed to help with his shortness of breath therefore his Lasix dosage had been increased utilizing an alternating day schedule.   He presented to [facility] because of progressive dyspnea and apparent worsening productive cough. CT of the chest was negative for PE and demonstrated stable interstitial and alveolar changes. ABG revealed PO2 of 50 and he was put on 100% nonrebreather mask with improvement in saturations. The patient requested to be transferred to [facility] so he could be cared for by his his cardiology and pulmonology physicians.  Assessment/Plan:  Acute on chronic respiratory failure with hypoxia due to:   A) Pulmonary fibrosis   B) COPD with emphysema   C) poor understanding/problem solving regarding O2 therapy as outpt  -PCT normal  -CXR and CT chest not c/w PNA, therefore abx were stopped  -continue steroids as per home dose -having recurrent issues with  functioning of O2 equipment at home - pulmonary has contacted pt's home care service to explore options - possible discharge in a.m. if arrangements can be made to assure equipment at home is functional -wean patient to nasal cannula oxygen - normally runs 6L rest 8L exertion  Chronic mild diastolic and systolic heart failure -compensated at present/pt actually volume depleted 2/2 recent Lasix increase -Slowly begin to resume home medications and follow closely  Mild elevation in cardiac enzymes -suspect demand ischemia  -appreciate Cards eval - no further workup felt to be indicated  ARF on CKD (chronic kidney disease) stage 3, GFR 30-59 ml/min -due to recent low volume from Lasix -Much improved with gentle hydration  Hypovolemia / dehydration -Resolved -held diuretics and gave IVF cautiously  Left foot pain and swelling -endorsed pre admit injury and had walking boot at home -PT/OT following  CAD -stable and no true ischemic sx's  -cont ASA and Plavix  Diabetes mellitus with peripheral vascular disease -controlled  Hyperkalemia -due to [facility] and ARF -Resolved  Medtronic dual-chamber ICD  DVT prophylaxis: Subcutaneous heparin Code Status: Full Family Communication: Patient Disposition Plan: transfer to tele bed  Isolation: None  Consultants: Pulmonary Cardiology  Procedures: 4/17 - TTE - ejection fraction 45-50% with grade 1 diastolic dysfunction  Antibiotics: Zithromax 4/16 >>> 4/17 Rocephin 4/16 >>> 4/17  HPI/Subjective: The patient has no complaints whatsoever today.  He remains on Venturi mask.  He denies chest pain shortness of breath fevers chills or abdominal pain.  Objective: Blood pressure 122/64, pulse 70, temperature 97.8 F (36.6 C), temperature source Oral, resp.  rate 21, height 5\' 6"  (1.676 m), weight 82.8 kg (182 lb 8.7 oz), SpO2 96.00%.  Intake/Output Summary (Last 24 hours) at 01/18/13 1106 Last data filed at 01/18/13 0422  Gross per 24  hour  Intake    200 ml  Output    600 ml  Net   -400 ml   Exam: General: No acute respiratory distress on Venturi mask Lungs: Fine crackles throughout all fields with no wheeze Cardiovascular: Regular rate and rhythm without murmur gallop or rub  Abdomen: Nontender, nondistended, soft, bowel sounds positive, no rebound, no ascites, no appreciable mass Musculoskeletal: No significant cyanosis, clubbing of bilateral lower extremities  Data Reviewed: Basic Metabolic Panel:  Recent Labs Lab 01/14/13 1115 01/15/13 0454 01/15/13 1627 01/16/13 0500 01/17/13 0510 01/18/13 0550  NA  --  143 138 140 140 139  K  --  5.8* 5.0 4.6 4.6 4.0  CL  --  106 107 107 105 104  CO2  --  31 22 21 25 27   GLUCOSE  --  87 118* 71 102* 80  BUN  --  27* 24* 22 19 22   CREATININE 1.79* 1.91* 1.48* 1.39* 1.25 1.37*  CALCIUM  --  8.9 8.9 8.9 9.3 9.4  MG 2.1  --   --   --   --   --    Liver Function Tests:  Recent Labs Lab 01/15/13 0454 01/16/13 0500  AST 38* 27  ALT 21 18  ALKPHOS 26* 22*  BILITOT 0.4 0.5  PROT 6.2 5.9*  ALBUMIN 2.8* 2.6*   CBC:  Recent Labs Lab 01/14/13 1115 01/15/13 0454 01/16/13 0500 01/18/13 0550  WBC 12.0* 7.7 8.3 6.7  HGB 12.8* 11.8* 11.4* 10.1*  HCT 38.8* 35.8* 34.1* 30.7*  MCV 89.6 91.6 91.2 90.3  PLT 145* 114* 88* 131*   Cardiac Enzymes:  Recent Labs Lab 01/14/13 1100 01/14/13 1653 01/14/13 2217 01/15/13 1300 01/15/13 1626  TROPONINI 0.88* 0.59* 0.32* <0.30 <0.30   BNP (last 3 results)  Recent Labs  03/10/12 1605 01/14/13 1100  PROBNP 238.0* 1684.0*   CBG:  Recent Labs Lab 01/17/13 1210 01/17/13 1456 01/17/13 1719 01/17/13 2116 01/18/13 0824  GLUCAP 129* 186* 141* 92 79    Recent Results (from the past 240 hour(s))  CULTURE, BLOOD (ROUTINE X 2)     Status: None   Collection Time    01/14/13 11:00 AM      Result Value Range Status   Specimen Description BLOOD RIGHT ARM   Final   Special Requests BOTTLES DRAWN AEROBIC ONLY 7CC    Final   Culture  Setup Time 01/14/2013 14:31   Final   Culture     Final   Value:        BLOOD CULTURE RECEIVED NO GROWTH TO DATE CULTURE WILL BE HELD FOR 5 DAYS BEFORE ISSUING A FINAL NEGATIVE REPORT   Report Status PENDING   Incomplete  CULTURE, BLOOD (ROUTINE X 2)     Status: None   Collection Time    01/14/13 11:15 AM      Result Value Range Status   Specimen Description BLOOD RIGHT THUMB   Final   Special Requests BOTTLES DRAWN AEROBIC ONLY 6CC   Final   Culture  Setup Time 01/14/2013 14:31   Final   Culture     Final   Value:        BLOOD CULTURE RECEIVED NO GROWTH TO DATE CULTURE WILL BE HELD FOR 5 DAYS BEFORE ISSUING A FINAL NEGATIVE  REPORT   Report Status PENDING   Incomplete  MRSA PCR SCREENING     Status: None   Collection Time    01/14/13 11:55 AM      Result Value Range Status   MRSA by PCR NEGATIVE  NEGATIVE Final   Comment:            The GeneXpert MRSA Assay (FDA     approved for NASAL specimens     only), is one component of a     comprehensive MRSA colonization     surveillance program. It is not     intended to diagnose MRSA     infection nor to guide or     monitor treatment for     MRSA infections.     Studies:  Recent x-ray studies have been reviewed in detail by the Attending Physician  Scheduled Meds:  Reviewed in detail by the Attending Physician   Lonia Blood, MD [facility] Office  [phone] Pager [phone]  On-Call/Text Page:      [email or web address]      password [email or web address]  If 7PM-7AM, please contact night-coverage [email or web address] Password [email or web address] 01/18/2013, 11:06 AM   LOS: 4 days

## 2013-01-18 NOTE — Progress Notes (Signed)
Patient tolerates 8 L oxygen via [place],sats 93-96% reported called to RN [id number].Patient transferred on wheel chair stable

## 2013-01-19 ENCOUNTER — Ambulatory Visit: Payer: Medicare Other | Admitting: Nurse Practitioner

## 2013-01-19 ENCOUNTER — Encounter: Payer: Self-pay | Admitting: *Deleted

## 2013-01-19 LAB — BASIC METABOLIC PANEL
CO2: 27 mEq/L (ref 19–32)
Chloride: 107 mEq/L (ref 96–112)
Creatinine, Ser: 1.38 mg/dL — ABNORMAL HIGH (ref 0.50–1.35)
GFR calc Af Amer: 56 mL/min — ABNORMAL LOW (ref >90)
Potassium: 3.9 mEq/L (ref 3.5–5.1)

## 2013-01-19 LAB — GLUCOSE, CAPILLARY
Glucose-Capillary: 100 mg/dL — ABNORMAL HIGH (ref 70–99)
Glucose-Capillary: 156 mg/dL — ABNORMAL HIGH (ref 70–99)
Glucose-Capillary: 105 mg/dL — ABNORMAL HIGH (ref 70–99)

## 2013-01-19 MED ORDER — INSULIN DETEMIR 100 UNIT/ML ~~LOC~~ SOLN: 16.0000 [IU] | Freq: Every day | SUBCUTANEOUS | Status: DC

## 2013-01-19 NOTE — Discharge Summary (Signed)
Physician Discharge Summary  [name] ZOX:[id number] DOB: 1936-01-01 DOA: 01/14/2013  PCP: Lucila [name], MD  Admit date: 01/14/2013 Discharge date: 01/21/2013  Time spent: 30 minutes  Recommendations for Outpatient Follow-up:  1. Patient set up with Home Hospice with [facility] 2. Have asked case management to explore previously documented issues with patient's ability to set up and manage his home oxygen to ensure this is being done adequately after patient returns to home environment 3. Patient should follow up with his pulmonologist in one week after discharge 4. Patient should followup with his primary care physician in about 3 weeks after discharge 5. Sugars were well-controlled during this hospitalization so Levemir dose was decreased this admission 6. Blood pressure is well-controlled on Bystolic alone so given only mild systolic dysfunction have discontinued Cozaar for now 7. 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- compensated   Acute and chronic respiratory failure with hypoxia due to decompensated pulmonary fibrosis/apparent issues with O2 delivery system at home- stable   Chronic diastolic and systolic heart failure   Leukocytosis, unspecified   AKI (acute kidney injury)   CKD (chronic kidney disease) stage 3, GFR 30-59 ml/min   Hypovolemia dehydration due to diuretics   CAD   Diabetes mellitus with peripheral vascular disease   Hyperkalemia   Med[employer]onic dual-chamber ICD   Discharge Condition: stable  Diet recommendation: carbohydrate modified  Filed Weights   01/19/13 0606 01/20/13 0545 01/21/13 0537  Weight: 78.6 kg (173 lb 4.5 oz) 78.2 kg (172 lb 6.4 oz) 78.654 kg (173 lb 6.4 oz)    History of present illness:  77 year old male  with COPD and pulmonary fibrosis on chronic oxygen between 6-8 L per minute. He also has combined diastolic and systolic heart failure. Last echocardiogram at the end of 2013 revealed improved ejection fraction of 50%. He had a dual-chamber ICD placed. He is on chronic prednisone. He was last seen in the office by [name] in February of this year with dyspnea and hypoxemia felt to be related to progressive pulmonary fibrosis. He was given a burst of steroids and tapered back down to his usual prednisone dose with plans to followup in 2 months. He was seen by [name] in the office at the beginning of April at which point he was having increasing dyspnea and some increasing lower extremity edema in the setting of recent increase in steroids as noted above. He had noticed swelling and took extra Lasix which seemed to help with his shortness of breath therefore his Lasix dosage had been increased utilizing an alternating day schedule.  He presented to [facility] because of progressive dyspnea and apparent worsening productive cough. CT of the chest was negative for PE and demonstrated stable interstitial and alveolar changes. ABG revealed PO2 of 50 and he was put on 100% nonrebreather mask with improvement in saturations. The patient requested to be transferred to [facility] so he could be cared for by his his cardiology and pulmonology physicians.   Hospital Course:  Acute on chronic respiratory failure with hypoxia due to:  A) Pulmonary fibrosis  B) COPD with emphysema  C) poor understanding/problem solving regarding O2 therapy as outpt  Consideration for possible pneumonia at presentation but Procalcitonin was normal and there was no evidence of focal infiltrates on chest x-ray or CT scan of  the chest therefore antibiotics were discontinued. He was continued on his home dose of steroids. Pulmonary medicine was consulted to assist in the management of his care since it was felt pulmonary  fibrosis is a primary triggering factor for his acute respiratory failure. He endorsed recurrent issues with malfunctioning O2 quit in home. Pulmonary medicine identified possible problems with patient assembling the 02 he medications system likely contributing to oxygen malfunction. Case management was consulted prior to discharge to ensure patient would be properly instructed once again and had to set up his oxygen at home.Per Case management-Home health agency was going out to the patient's house today for evaluation of equipment. In addition home health safety evaluation with physical therapy after arrival to home was also requested. Patient endorsed he would have initially his brother and his son to stay with him until he became better. Patient's baseline oxygen dependency is as follows: Resting oxygen requirement 6 L and exertional oxygen requirements 8 L. Patient also describes has a Ventimask type device available to utilize to deliver higher concentrations for episodes of acute dyspnea and to use at night since he is a mouth breather. Pulmonary medicine also recommends to keep sats at least 88%. Patient had follow up with PCCM yesterday-Dr Marchelle Gearing had a long d/w pt and family-at this time recommendations are to discharge home with hospice. [facility] hospice subsequently was contacted, and patient has been set up with them -patient will be placed on low dose narcotics for comfort and dyspnea -patient agreeable with DNR and slow transition to comfort care evantually  Chronic mild diastolic and systolic heart failure Was actually compensated at presentation since the patient was volume depleted secondary to recent increases in preadmission Lasix dosages. He was gently IV hydrated. We resumed many of his cardiac medications prior to discharge but since blood pressure remained soft in the 110-120 range systolic his Lasix was discontinued at discharge. He has remained on his usual dose of Bystolic but his  Cozaar was discontinued. Patient also underwent a 2-D echocardiogram this admission that revealed only a mildly depressed ejection fraction of 45-50% with grade 1 diastolic dysfunction. Suspect this patient would better be served by administration of when necessary Lasix based on his weight forms daily. Will defer to PCP vs cardiologist.  Mild elevation in cardiac enzymes/known CAD Suspect related to demand ischemia. Cardiology evaluated the patient during this admission and no further workup was indicated. Patient has been continued on aspirin and Plavix throughout the hospitalization  ARF on CKD (chronic kidney disease) stage 3, GFR 30-59 ml/min/Hypovolemia / dehydration Suspect related to recent low volume and perfusion from Lasix and dehydration. Resolved after gentle hydration.  Diabetes mellitus with peripheral vascular disease Has remained well-controlled on the hospitalization and his Levemir doses which actually decreased from 20-16 units daily. His Starlix was resumed at discharge noting that this medication is only taken by the patient he consumes a very large meal based on his preadmission parameters.  Procedures: 4/17 - TTE - ejection fraction 45-50% with grade 1 diastolic dysfunction   Consultations:  Cardiology  Pulmonary medicine  Discharge Exam: Filed Vitals:   01/21/13 0434 01/21/13 0537 01/21/13 0541 01/21/13 0827  BP:  116/71    Pulse:  72    Temp:  97.8 F (36.6 C)    TempSrc:  Oral    Resp:  22    Height:      Weight:  78.654 kg (173 lb 6.4 oz)    SpO2: 95% 90% 93% 92%  General: No acute respiratory distress on Venturi mask-has been weaned to 6 L nasal cannula with saturation 96% at rest Lungs: Fine crackles throughout all fields with no wheeze  Cardiovascular: Regular rate and rhythm without murmur gallop or rub  Abdomen: Nontender, nondistended, soft, bowel sounds positive, no rebound, no ascites, no appreciable mass  Musculoskeletal: No significant  cyanosis, clubbing of bilateral lower extremities   Discharge Instructions       Future Appointments Provider Department Dept Phone   02/02/2013 12:25 PM [facility] Main Office [place]) [phone]   02/17/2013 11:00 AM [facility] [place] [facility] [phone]   02/24/2013 2:15 PM Larina Earthly, MD [facility] [phone]   02/25/2013 2:30 PM Storm Frisk, MD [facility] [phone]   03/20/2013 1:30 PM Rosalio Macadamia, NP [facility] Main Office [place]) [phone]   04/06/2013 9:10 AM [facility] Main Office [place]) [phone]       Medication List    STOP taking these medications       furosemide 40 MG tablet  Commonly known as:  LASIX     losartan 25 MG tablet  Commonly known as:  COZAAR     potassium chloride SA 20 MEQ tablet  Commonly known as:  K-DUR,KLOR-CON      TAKE these medications       albuterol (2.5 MG/3ML) 0.083% nebulizer solution  Commonly known as:  PROVENTIL  Take 2.5 mg by nebulization daily.     aspirin 81 MG tablet  Take 81 mg by mouth daily.     atorvastatin 40 MG tablet  Commonly known as:  LIPITOR  Take 1 tablet (40 mg total) by mouth daily.     clopidogrel 75 MG tablet  Commonly known as:  PLAVIX  TAKE 1 TABLET ONCE DAILY.     ezetimibe 10 MG tablet  Commonly known as:  ZETIA  Take 10 mg by mouth daily.     FLUoxetine 40 MG capsule  Commonly known as:  PROZAC  Take 40 mg by mouth daily.     Fluticasone-Salmeterol 250-50 MCG/DOSE Aepb  Commonly known as:  ADVAIR DISKUS  Inhale 1 puff into the lungs 2 (two) times daily.     insulin detemir 100 UNIT/ML injection  Commonly known as:  LEVEMIR  Inject 0.16 mLs (16 Units total) into the skin daily.     morphine 20 MG/5ML solution  Take 0.6 mLs (2.4 mg total) by mouth every 4 (four) hours as needed for pain.     multivitamin with  minerals Tabs  Take 1 tablet by mouth daily.     nateglinide 60 MG tablet  Commonly known as:  STARLIX  Take 60 mg by mouth daily as needed (when eating heavy meals).     nebivolol 2.5 MG tablet  Commonly known as:  BYSTOLIC  Take 2.5 mg by mouth daily.     NON FORMULARY  6 liters rest 8L exertion 24/7     pantoprazole 40 MG tablet  Commonly known as:  PROTONIX  Take 1 tablet (40 mg total) by mouth 2 (two) times daily.     PEN NEEDLES 31GX5/16" 31G X 8 MM Misc     predniSONE 10 MG tablet  Commonly known as:  DELTASONE  Take 10 mg daily     SPIRIVA HANDIHALER 18 MCG inhalation capsule  Generic drug:  tiotropium  PLACE 1 CAPSULE INTO INHALER AND INHALE DAILY.  Follow-up Information   Follow up with Lucila [name], MD. Schedule an appointment as soon as possible for a visit in 2 weeks.   Contact information:   [address] [place] [address] [phone]       Follow up with Shan Levans, MD. Schedule an appointment as soon as possible for a visit in 1 week.   Contact information:   [address] [place] [address] [phone]        The results of significant diagnostics from this hospitalization (including imaging, microbiology, ancillary and laboratory) are listed below for reference.    Significant Diagnostic Studies: Dg Chest 2 View  12/24/2012  *RADIOLOGY REPORT*  Clinical Data: Pulmonary fibrosis.  Respiratory failure.  CHEST - 2 VIEW  Comparison: 07/16/2012  Findings: Left AICD remains in place, unchanged.  Prior CABG. Cardiomegaly. Chronic bibasilar opacities, likely scarring or fibrosis.  No overt edema.  No visible effusions.  No acute bony abnormality.  IMPRESSION: No acute cardiopulmonary disease.  Chronic changes as above.   Original Report Authenticated By: Charlett Nose, M.D.    Dg Chest Port 1 View  01/14/2013  *RADIOLOGY REPORT*  Clinical Data: Shortness of breath, chest pain, hypertension, coronary artery disease post MI and CABG,  COPD, ischemic cardiomyopathy, pulmonary fibrosis, diabetes, CHF  PORTABLE CHEST - 1 VIEW  Comparison: Portable exam 1156 hours compared to 0220 hours  Findings: Left subclavian sequential transvenous pacemaker leads project at right atrium and right ventricle. Enlargement of cardiac silhouette post CABG. Chronic peripheral interstitial prominence question pulmonary fibrosis. No definite acute superimposed infiltrate. Underlying emphysematous changes. No pleural effusion, pneumothorax or acute osseous findings.  IMPRESSION: Enlargement of cardiac silhouette post CABG and pacemaker / AICD. Pulmonary fibrosis. No acute abnormalities.   Original Report Authenticated By: Ulyses Southward, M.D.     Microbiology: Recent Results (from the past 240 hour(s))  CULTURE, BLOOD (ROUTINE X 2)     Status: None   Collection Time    01/14/13 11:00 AM      Result Value Range Status   Specimen Description BLOOD RIGHT ARM   Final   Special Requests BOTTLES DRAWN AEROBIC ONLY [facility]   Final   Culture  Setup Time 01/14/2013 14:31   Final   Culture NO GROWTH 5 DAYS   Final   Report Status 01/20/2013 FINAL   Final  CULTURE, BLOOD (ROUTINE X 2)     Status: None   Collection Time    01/14/13 11:15 AM      Result Value Range Status   Specimen Description BLOOD RIGHT THUMB   Final   Special Requests BOTTLES DRAWN AEROBIC ONLY [facility]   Final   Culture  Setup Time 01/14/2013 14:31   Final   Culture NO GROWTH 5 DAYS   Final   Report Status 01/20/2013 FINAL   Final  MRSA PCR SCREENING     Status: None   Collection Time    01/14/13 11:55 AM      Result Value Range Status   MRSA by PCR NEGATIVE  NEGATIVE Final   Comment:            The GeneXpert MRSA Assay (FDA     approved for NASAL specimens     only), is one component of a     comprehensive MRSA colonization     surveillance program. It is not     intended to diagnose MRSA     infection nor to guide or     monitor treatment  for     MRSA infections.     Labs: Basic  Metabolic Panel:  Recent Labs Lab 01/14/13 1115  01/15/13 1627 01/16/13 0500 01/17/13 0510 01/18/13 0550 01/19/13 0620  NA  --   < > 138 140 140 139 142  K  --   < > 5.0 4.6 4.6 4.0 3.9  CL  --   < > 107 107 105 104 107  CO2  --   < > 22 21 25 27 27   GLUCOSE  --   < > 118* 71 102* 80 112*  BUN  --   < > 24* 22 19 22  24*  CREATININE 1.79*  < > 1.48* 1.39* 1.25 1.37* 1.38*  CALCIUM  --   < > 8.9 8.9 9.3 9.4 9.4  MG 2.1  --   --   --   --   --   --   < > = values in this interval not displayed. Liver Function Tests:  Recent Labs Lab 01/15/13 0454 01/16/13 0500  AST 38* 27  ALT 21 18  ALKPHOS 26* 22*  BILITOT 0.4 0.5  PROT 6.2 5.9*  ALBUMIN 2.8* 2.6*   No results found for this basename: LIPASE, AMYLASE,  in the last 168 hours No results found for this basename: AMMONIA,  in the last 168 hours CBC:  Recent Labs Lab 01/14/13 1115 01/15/13 0454 01/16/13 0500 01/18/13 0550  WBC 12.0* 7.7 8.3 6.7  HGB 12.8* 11.8* 11.4* 10.1*  HCT 38.8* 35.8* 34.1* 30.7*  MCV 89.6 91.6 91.2 90.3  PLT 145* 114* 88* 131*   Cardiac Enzymes:  Recent Labs Lab 01/14/13 1100 01/14/13 1653 01/14/13 2217 01/15/13 1300 01/15/13 1626  TROPONINI 0.88* 0.59* 0.32* <0.30 <0.30   BNP: BNP (last 3 results)  Recent Labs  03/10/12 1605 01/14/13 1100  PROBNP 238.0* 1684.0*   CBG:  Recent Labs Lab 01/20/13 0804 01/20/13 1207 01/20/13 1708 01/20/13 2145 01/21/13 0751  GLUCAP 93 123* 141* 140* 96       Signed:  GHIMIRE,SHANKERANP  [facility] 01/21/2013, 10:43 AM

## 2013-01-19 NOTE — Progress Notes (Signed)
PULMONARY  / CRITICAL CARE MEDICINE  Name: [name] MRN: [id number] DOB: 09-23-1936    ADMISSION DATE:  01/14/2013 CONSULTATION DATE:  01/15/13  REFERRING MD :  Dr. Sharon Seller PRIMARY SERVICE:  TRH  CHIEF COMPLAINT:  Fall / AMS / Hypoxia  BRIEF PATIENT DESCRIPTION: 77 y/o M admitted to [facility] on 4/16 PM from [facility] after his oxygen "ran out" and blacked out at home, falling (striking foot).  Work up at [facility] included at CT of chest which was negative for PE but had R midlung infiltrate, ABG with PaO2 of 50, increased WBC.    SIGNIFICANT EVENTS / STUDIES:    LINES / TUBES:   CULTURES: 4/16 BCx2>>> 4/16 Sputum>>>  ANTIBIOTICS:   SUBJECTIVE: RN reports no acute events.  Pt continues to tell the same story about his machine "being blocked".    VITAL SIGNS: Temp:  [98 F (36.7 C)-98.2 F (36.8 C)] 98.2 F (36.8 C) (04/21 0606) Pulse Rate:  [69-77] 69 (04/21 0925) Resp:  [20-22] 22 (04/21 0606) BP: (119-122)/(71-78) 120/77 mmHg (04/21 0925) SpO2:  [85 %-96 %] 96 % (04/21 0606) Weight:  [173 lb 4.5 oz (78.6 kg)] 173 lb 4.5 oz (78.6 kg) (04/21 0606)  PHYSICAL EXAMINATION: General:  Chronically ill elderly male in NAD Neuro:  AAOx4, speech clear, MAE HEENT:  Mm pink / moist, no jvd Cardiovascular:  s1s2 rrr, distant Lungs:  resp's even/non-labored at rest, lungs bilaterally with fine crackles Abdomen:  Obese/soft, bsx4 active Musculoskeletal:  No acute deformities, LLE with bruising s/p fall (appears old) Skin:  Warm/dry, minimal LE edema   Recent Labs Lab 01/17/13 0510 01/18/13 0550 01/19/13 0620  NA 140 139 142  K 4.6 4.0 3.9  CL 105 104 107  CO2 25 27 27   BUN 19 22 24*  CREATININE 1.25 1.37* 1.38*  GLUCOSE 102* 80 112*    Recent Labs Lab 01/15/13 0454 01/16/13 0500 01/18/13 0550  HGB 11.8* 11.4* 10.1*  HCT 35.8* 34.1* 30.7*  WBC 7.7 8.3 6.7  PLT 114* 88* 131*   IMAGING:  CXR 4/16 showed cardiomeg, s/p CABG, AICD; pulm fibrosis,  NAD...   ASSESSMENT / PLAN:  Pulmonary Fibrosis on chronic O2 - wears 6L at rest and 8L with activity COPD - without evidence of acute exacerbation  Plan: -O2 to support sats >88% => currently sating at 96% on 6L O2 -investigate home O2 set up.  Would like to have [employer] go to his house and re-instruct him on how to change humidity bottle and use equipment. He is fixated on this story and is very difficult to discuss care.  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  He notes stories of being in the dark and could figure out how to turn on the oxygen when the power was out.   -follow up with [name] in office post discharge.       Canary Brim, NP-C  Pulmonary & Critical Care Pgr: [phone] or [phone]   01/19/2013, 1:21 PM    STAFF NOTE: I, Dr Lavinia Sharps have personally reviewed patient's available data, including medical history, events of note, physical examination  and test results as part of my evaluation. I have discussed with resident/NP and other care providers such as pharmacist, RN and RRT.  In addition,  I personally evaluated patient and elicited key findings of has chronic respiratory failure on basis of emphysema and pulmonary fibrosis not otherwise specified but with autoimmune workup negative. He is on 6 L oxygen. He is class IV dyspnea. He says he cannot do pulmonary rehabilitation anymore. He appears to be fixated on his neck up and issues although I personally feel that he will benefit from a symptom-based approach to his chronic respiratory failure. A palliative care consult  potentially might be helpful. I will approach this topic tomorrow 01/20/2013. I will seek input from his primary pulmonologist Dr. Shan Levans.  Rest per NP/medical resident whose note is outlined above and that I agree with      Dr. Kalman Shan, M.D., [facility].C.P Pulmonary and Critical Care Medicine Staff Physician [facility] [place] Pulmonary and Critical Care Pager: [phone], If no answer or between  15:00h - 7:00h: call [phone]  01/19/2013 5:20 PM

## 2013-01-19 NOTE — Progress Notes (Signed)
[facility] Progress Note [name] TEAM 1 - Stepdown/ICU TEAM   TYSEAN VANDERVLIET YNW:[id number] DOB: [birth year] DOA: 01/14/2013 PCP: Lucila [name], MD  Brief narrative: 77 year old male with COPD and pulmonary fibrosis on chronic oxygen between 6-8 L per minute. He also has combined diastolic and systolic heart failure. Last echocardiogram at the end of 2013 revealed improved ejection fraction of 50%.  He had a dual-chamber ICD placed. He is on chronic prednisone. He was last seen in the office by [name] in February of this year with dyspnea and hypoxemia felt to be related to progressive pulmonary fibrosis. He was given a burst of steroids and tapered back down to his usual prednisone dose with plans to followup in 2 months. He was seen by [name] in the office at the beginning of April at which point he was having increasing dyspnea and some increasing lower extremity edema in the setting of recent increase in steroids as noted above. He had noticed swelling and took extra Lasix which seemed to help with his shortness of breath therefore his Lasix dosage had been increased utilizing an alternating day schedule.   He presented to [facility] because of progressive dyspnea and apparent worsening productive cough. CT of the chest was negative for PE and demonstrated stable interstitial and alveolar changes. ABG revealed PO2 of 50 and he was put on 100% nonrebreather mask with improvement in saturations. The patient requested to be transferred to [facility] so he could be cared for by his his cardiology and pulmonology physicians.  Assessment/Plan:  Acute on chronic respiratory failure with hypoxia due to:   A) Pulmonary fibrosis   B) COPD with emphysema   C) poor understanding/problem solving regarding O2 therapy as outpt  -PCT normal  -CXR and CT chest not c/w PNA, therefore abx were stopped  -continue steroids as per home dose -having recurrent issues with  functioning of O2 equipment at home - pulmonary has contacted pt's home care service to explore options - possible discharge in a.m. 4/22 if arrangements can be made to assure equipment at home is functional - HH  Face-to -face completed for [facility] and aide/PT safety eval -wean patient to nasal cannula oxygen as able- normally runs 6L rest 8L exertion -Ask pulmonary to visit patient again and assist with severe hypoxia management  Chronic mild diastolic and systolic heart failure -compensated at present/pt actually volume depleted 2/2 recent Lasix increase -Slowly begin to resume home medications and follow closely -Cozaar dc'd this admit- cont Bystolic - plan not to resume Lasix after dc unless volume overload becomes apparent  Mild elevation in cardiac enzymes -suspect demand ischemia  -appreciate Cards eval - no further workup felt to be indicated  ARF on CKD (chronic kidney disease) stage 3, GFR 30-59 ml/min -due to recent low volume from Lasix -Much improved with gentle hydration  Hypovolemia / dehydration -Resolved -held diuretics and gave IVF cautiously  Left foot pain and swelling -endorsed pre admit injury and had walking boot at home -PT/OT following  CAD -stable and no true ischemic sx's  -cont ASA and Plavix  Diabetes mellitus with peripheral vascular disease -controlled on Levemir- dose decreased this admission  Hyperkalemia -due to [facility] and ARF -Resolved  Medtronic dual-chamber ICD  DVT prophylaxis: Subcutaneous heparin Code Status: Full Family Communication: Patient Disposition Plan:Telemetry- dc held pending further pulmonary eval Isolation: None  Consultants: Pulmonary Cardiology  Procedures: 4/17 - TTE - ejection fraction 45-50% with grade 1 diastolic dysfunction  Antibiotics:  Zithromax 4/16 >>> 4/17 Rocephin 4/16 >>> 4/17  HPI/Subjective: The patient has no complaints whatsoever today.  He remains on Venturi mask. Seems to tolerate lower than  expected oxygen sats at home, reporting that he routinely experiences oxygen saturations in the 70s at home.  Objective: Blood pressure 120/77, pulse 69, temperature 98.2 F (36.8 C), temperature source Axillary, resp. rate 22, height 5\' 6"  (1.676 m), weight 78.6 kg (173 lb 4.5 oz), SpO2 96.00%.  Intake/Output Summary (Last 24 hours) at 01/19/13 1545 Last data filed at 01/19/13 0900  Gross per 24 hour  Intake      0 ml  Output    300 ml  Net   -300 ml   Exam: General: No acute respiratory distress on Venturi mask Lungs: Fine crackles throughout all fields with no wheeze Cardiovascular: Regular rate and rhythm without murmur gallop or rub  Abdomen: Nontender, nondistended, soft, bowel sounds positive, no rebound, no ascites, no appreciable mass Musculoskeletal: No significant cyanosis of bilateral lower extremities  Data Reviewed: Basic Metabolic Panel:  Recent Labs Lab 01/14/13 1115  01/15/13 1627 01/16/13 0500 01/17/13 0510 01/18/13 0550 01/19/13 0620  NA  --   < > 138 140 140 139 142  K  --   < > 5.0 4.6 4.6 4.0 3.9  CL  --   < > 107 107 105 104 107  CO2  --   < > 22 21 25 27 27   GLUCOSE  --   < > 118* 71 102* 80 112*  BUN  --   < > 24* 22 19 22  24*  CREATININE 1.79*  < > 1.48* 1.39* 1.25 1.37* 1.38*  CALCIUM  --   < > 8.9 8.9 9.3 9.4 9.4  MG 2.1  --   --   --   --   --   --   < > = values in this interval not displayed. Liver Function Tests:  Recent Labs Lab 01/15/13 0454 01/16/13 0500  AST 38* 27  ALT 21 18  ALKPHOS 26* 22*  BILITOT 0.4 0.5  PROT 6.2 5.9*  ALBUMIN 2.8* 2.6*   CBC:  Recent Labs Lab 01/14/13 1115 01/15/13 0454 01/16/13 0500 01/18/13 0550  WBC 12.0* 7.7 8.3 6.7  HGB 12.8* 11.8* 11.4* 10.1*  HCT 38.8* 35.8* 34.1* 30.7*  MCV 89.6 91.6 91.2 90.3  PLT 145* 114* 88* 131*   Cardiac Enzymes:  Recent Labs Lab 01/14/13 1100 01/14/13 1653 01/14/13 2217 01/15/13 1300 01/15/13 1626  TROPONINI 0.88* 0.59* 0.32* <0.30 <0.30   BNP  (last 3 results)  Recent Labs  03/10/12 1605 01/14/13 1100  PROBNP 238.0* 1684.0*   CBG:  Recent Labs Lab 01/18/13 1218 01/18/13 1723 01/18/13 2216 01/19/13 0738 01/19/13 1209  GLUCAP 141* 123* 120* 100* 156*    Recent Results (from the past 240 hour(s))  CULTURE, BLOOD (ROUTINE X 2)     Status: None   Collection Time    01/14/13 11:00 AM      Result Value Range Status   Specimen Description BLOOD RIGHT ARM   Final   Special Requests BOTTLES DRAWN AEROBIC ONLY 7CC   Final   Culture  Setup Time 01/14/2013 14:31   Final   Culture     Final   Value:        BLOOD CULTURE RECEIVED NO GROWTH TO DATE CULTURE WILL BE HELD FOR 5 DAYS BEFORE ISSUING A FINAL NEGATIVE REPORT   Report Status PENDING   Incomplete  CULTURE, BLOOD (  ROUTINE X 2)     Status: None   Collection Time    01/14/13 11:15 AM      Result Value Range Status   Specimen Description BLOOD RIGHT THUMB   Final   Special Requests BOTTLES DRAWN AEROBIC ONLY 6CC   Final   Culture  Setup Time 01/14/2013 14:31   Final   Culture     Final   Value:        BLOOD CULTURE RECEIVED NO GROWTH TO DATE CULTURE WILL BE HELD FOR 5 DAYS BEFORE ISSUING A FINAL NEGATIVE REPORT   Report Status PENDING   Incomplete  MRSA PCR SCREENING     Status: None   Collection Time    01/14/13 11:55 AM      Result Value Range Status   MRSA by PCR NEGATIVE  NEGATIVE Final   Comment:            The GeneXpert MRSA Assay (FDA     approved for NASAL specimens     only), is one component of a     comprehensive MRSA colonization     surveillance program. It is not     intended to diagnose MRSA     infection nor to guide or     monitor treatment for     MRSA infections.     Studies:  Recent x-ray studies have been reviewed in detail by the Attending Physician  Scheduled Meds:  Reviewed in detail by the Attending Physician   Junious Silk, ANP [facility] Office  [phone] Pager [phone]  On-Call/Text Page:       [email or web address]      password [email or web address]  If 7PM-7AM, please contact night-coverage [email or web address] Password [email or web address] 01/19/2013, 3:45 PM   LOS: 5 days   I have personally examined this patient and reviewed the entire database. I have reviewed the above note, made any necessary editorial changes, and agree with its content.  Lonia Blood, MD [facility]

## 2013-01-19 NOTE — Progress Notes (Signed)
Occupational Therapy Treatment Patient Details Name: [name] MRN: [id number] DOB: 12/19/1935 Today's Date: 01/19/2013 Time: 4098-1191 OT Time Calculation (min): 20 min  OT Assessment / Plan / Recommendation Comments on Treatment Session Pt at EOB upon entering.  Discussed equipment needed.  Pt stated he had all the equipment he needs and he knew all about the energy conservation techniques he needs to do for himself.  Pt became more frustrated when discussing current situation and stats began to drop.  When asked to purse lip breath and take a break from talking pt continued to talk and became more frustrated. Sats dropped from 80s to 70s.OT asked CM to add HHOT to pt d/c.  Pt is appropriate for HHOT to establish energy conservation techniques and home modifications .  Recommend palliative care consult for diagnosis/treatment management. HW    Follow Up Recommendations  Home health OT;Supervision/Assistance - 24 hour       Equipment Recommendations  none      Frequency Min 2X/week   Plan Discharge plan remains appropriate    Precautions / Restrictions Precautions Precautions: Fall Precaution Comments: Newly injured Lt. foot, and pt with high 02 requirements Restrictions Weight Bearing Restrictions: No LLE Weight Bearing: Weight bearing as tolerated Other Position/Activity Restrictions: Pt has a walking boot from previous foot fracture       ADL  ADL Comments: Pt stated he becomes more SOB when bathing.  Tried to discussed energy conservation techniques and OTAS handed pt a handout.  Pt stated he already had this handout and returned it to OTAS.       OT Goals ADL Goals ADL Goal: Additional Goal #1 - Progress: Not progressing  Visit Information  Last OT Received On: 01/19/13 Assistance Needed: +1 (stayed at EOB) PT/OT Co-Evaluation/Treatment: Yes          Cognition  Cognition Arousal/Alertness: Awake/alert Behavior During Therapy:  (frustrated with situation  and current circumstances) Overall Cognitive Status: Within Functional Limits for tasks assessed          Balance Balance Balance Assessed: No Static Sitting Balance Static Sitting - Balance Support: No upper extremity supported;Feet supported Static Sitting - Level of Assistance: 7: Independent Static Sitting - Comment/# of Minutes:  (20 mins discussing energy conservation techniques and equip)   End of Session OT - End of Session Activity Tolerance: Other (comment) (pt limited by drop in oxygen) Patient left: in chair;with call bell/phone within reach;with nursing in room Nurse Communication: Other (comment) (HHOT)   [name], [name] 01/19/2013, 4:30 PM I have reveiwed and agree with above progress note. [facility], OTR/L  [phone] 01/19/2013

## 2013-01-19 NOTE — Progress Notes (Signed)
Pt on 50% venti mask at beginning shift. Able to titrate to 40% venti mask to maintain O2 Sats > or equal to 88%. Attempt to use 6-8L [place] unsuccessful, pt is a mouth breather while asleep and O2 sats dropped to the 70's. Julien Nordmann [facility]

## 2013-01-19 NOTE — Progress Notes (Signed)
Physical Therapy Treatment Patient Details Name: [name] MRN: [id number] DOB: 1935/11/26 Today's Date: 01/19/2013 Time: 1520-1530 PT Time Calculation (min): 10 min  PT Assessment / Plan / Recommendation Comments on Treatment Session  Pt received phone call and many visitors and interruptions during session.  He has decreased Oxygen saturations at this time, but states this has happened before and he knows what to do to get better at home. He has a rollator at home that he will be able to use to enter house. He is able to direct application of cam boot  to provide support to left leg as needed.  Began discussion of scheduling, energy conservation,rest periods.  Pt states he is doing alot of that already.  He will have home safety eval.  Will d/d from acute acare PT     Follow Up Recommendations  Home health PT;Supervision - Intermittent;Supervision/Assistance - 24 hour     Does the patient have the potential to tolerate intense rehabilitation     Barriers to Discharge        Equipment Recommendations  None recommended by PT    Recommendations for Other Services    Frequency Min 3X/week   Plan Discharge plan remains appropriate;Frequency remains appropriate    Precautions / Restrictions Precautions Precautions: Fall Precaution Comments: Newly injured Lt. foot, and pt with high 02 requirements Restrictions Weight Bearing Restrictions: No LLE Weight Bearing: Weight bearing as tolerated Other Position/Activity Restrictions: Pt has a walking boot from previous foot fracture    Pertinent Vitals/Pain SATURATION QUALIFICATIONS: (This note is used to comply with regulatory documentation for home oxygen) Patient Saturations on 8L at Rest = 92:% Patient Saturations on 8L while talking, sitting on EOB = 83%     Mobility  Bed Mobility Bed Mobility: Sitting - Scoot to Edge of Bed;Rolling Right;Right Sidelying to Sit Rolling Right: 5: Supervision;With rail Right Sidelying to Sit:  4: Min assist;With rails Sitting - Scoot to Edge of Bed: 5: Supervision Details for Bed Mobility Assistance: sats dropped to 84% when moving to EOB.  Cues required for technique Transfers Sit to Stand: With upper extremity assist;From bed;3: Mod assist Stand to Sit: With upper extremity assist;To chair/3-in-1;3: Mod assist Details for Transfer Assistance: did not progress to sit to stand due to pt with desaturation to low 80's with pt talking and agitation.    Exercises     PT Diagnosis:    PT Problem List:   PT Treatment Interventions:     PT Goals Acute Rehab PT Goals PT Goal Formulation: With patient/family Time For Goal Achievement: 01/30/13 Potential to Achieve Goals: Good Pt will go Supine/Side to Sit: with modified independence;with HOB 0 degrees PT Goal: Supine/Side to Sit - Progress: Met Pt will go Sit to Supine/Side: with modified independence;with HOB 0 degrees Pt will Transfer Bed to Chair/Chair to Bed: with supervision (with RW) Pt will Ambulate: 16 - 50 feet;with min assist;with rolling walker Pt will Go Up / Down Stairs: 3-5 stairs;with min assist;with rail(s)  Visit Information  Last PT Received On: 01/19/13 Assistance Needed: +1 (stayed at EOB)    Subjective Data  Subjective: Pt  asking if he will go home today Patient Stated Goal: to get oxygen tank situation straightened out   Cognition  Cognition Arousal/Alertness: Awake/alert Behavior During Therapy:  (frustrated with situation and current circumstances) Overall Cognitive Status: Within Functional Limits for tasks assessed    Balance  Balance Balance Assessed: No Static Sitting Balance Static Sitting - Balance Support: No  upper extremity supported;Feet supported Static Sitting - Level of Assistance: 7: Independent Static Sitting - Comment/# of Minutes:  20 min discussing techniques with OT  End of Session PT - End of Session Equipment Utilized During Treatment: Oxygen   GP    Bayard Hugger. [place],  [place] [phone] 01/19/2013, 4:06 PM

## 2013-01-19 NOTE — Progress Notes (Signed)
Patient Name: [name]     admitted from [facility] with weakness and COPD exacerbation with hypoxemia --ongoing>>>65% this am in setting of pulm fibrosis  No chest pain   Complex CAD with CABG and redoX2 last in 2007 with last cath in 2010>>Med therapy +Tn--trivial prob mismatch      SUBJECTIVE:   Past Medical History  Diagnosis Date  . CAD (coronary artery disease)     s/p CABG; s/p redo CABG in 1997 and 2007; cath 4/10: LM occluded, CFX occluded, old S-RCA occluded, old S-RCA 90%, left radial to RCA ok, S-CFX ok, L-LAD ok, 2 old SVGs stumped  . Hypertension   . Hyperlipidemia   . Ischemic cardiomyopathy     echo 7/12: EF 45-50%, severe LVH, mild LAE; EF 50% per echo 2/13  . Chronic systolic heart failure     EF is 24% in the past; EF 45-50% in 7/12 and up to 50% in Feb 2013  . Carotid stenosis     doppler 11/11: 20-39% bilat.  . ICD (implantable cardiac defibrillator) in place 2010  . Hypertensive heart disease   . COPD (chronic obstructive pulmonary disease)   . History of stroke   . Hiatal hernia   . AAA (abdominal aortic aneurysm)     followed by [facility]  . Pulmonary fibrosis     followed by [name]; on chronic oxygen.   . Prolonged grief reaction   . Diabetes mellitus, insulin dependent (IDDM), controlled   . CHF (congestive heart failure)   . Myocardial infarction   . Stroke   . Shortness of breath     on home O2  . Rectal bleed February 2014    PHYSICAL EXAM Filed Vitals:   01/18/13 2044 01/18/13 2223 01/19/13 0606 01/19/13 0925  BP:  120/71 122/73 120/77  Pulse:  76 69 69  Temp:  98 F (36.7 C) 98.2 F (36.8 C)   TempSrc:  Axillary Axillary   Resp:  22 22   Height:      Weight:   173 lb 4.5 oz (78.6 kg)   SpO2: 94% 91% 96%     Well developed and nourished in mod resp distress HENT normal Neck supple with JVP-flat Clear with decreased breath sounds Regular rate and rhythm, no murmurs or gallops Abd-soft with active BS No Clubbing  cyanosis no edema Skin-warm and dry A & Oriented  Grossly normal sensory and motor function  TELEMETRY: Reviewed telemetry pt in  Apacing   Intake/Output Summary (Last 24 hours) at 01/19/13 0951 Last data filed at 01/19/13 0900  Gross per 24 hour  Intake      0 ml  Output    600 ml  Net   -600 ml    LABS: Basic Metabolic Panel:  Recent Labs Lab 01/14/13 1115  01/15/13 0454 01/15/13 1627 01/16/13 0500 01/17/13 0510 01/18/13 0550 01/19/13 0620  NA  --   --  143 138 140 140 139 142  K  --   --  5.8* 5.0 4.6 4.6 4.0 3.9  CL  --   --  106 107 107 105 104 107  CO2  --   --  31 22 21 25 27 27   GLUCOSE  --   --  87 118* 71 102* 80 112*  BUN  --   --  27* 24* 22 19 22  24*  CREATININE 1.79*  --  1.91* 1.48* 1.39* 1.25 1.37* 1.38*  CALCIUM  --   < >  8.9 8.9 8.9 9.3 9.4 9.4  MG 2.1  --   --   --   --   --   --   --   < > = values in this interval not displayed. Cardiac Enzymes: No results found for this basename: CKTOTAL, CKMB, CKMBINDEX, TROPONINI,  in the last 72 hours CBC:  Recent Labs Lab 01/14/13 1115 01/15/13 0454 01/16/13 0500 01/18/13 0550  WBC 12.0* 7.7 8.3 6.7  HGB 12.8* 11.8* 11.4* 10.1*  HCT 38.8* 35.8* 34.1* 30.7*  MCV 89.6 91.6 91.2 90.3  PLT 145* 114* 88* 131*  BNP: BNP (last 3 results)  Recent Labs  03/10/12 1605 01/14/13 1100  PROBNP 238.0* 1684.0*    ASSESSMENT AND PLAN:  Active Problems:   Chronic diastolic and systolic heart failure   Medtronic dual-chamber ICD   Pulmonary fibrosis   COPD with emphysema   CAD   Diabetes mellitus with peripheral vascular disease   CAP (community acquired pneumonia)   Acute and chronic respiratory failure with hypoxia   Leukocytosis, unspecified   AKI (acute kidney injury)   Hyperkalemia   CKD (chronic kidney disease) stage 3, GFR 30-59 ml/min   Hypovolemia dehydration  Still with significant desaturation with activity caardiac issues stable Will sign off   Call if can assist  further  Signed, Sherryl Manges MD  01/19/2013

## 2013-01-20 LAB — CULTURE, BLOOD (ROUTINE X 2)

## 2013-01-20 LAB — GLUCOSE, CAPILLARY
Glucose-Capillary: 141 mg/dL — ABNORMAL HIGH (ref 70–99)
Glucose-Capillary: 140 mg/dL — ABNORMAL HIGH (ref 70–99)

## 2013-01-20 NOTE — Progress Notes (Addendum)
  I have directly reviewed the clinical findings, lab, imaging studies and management of this patient in detail. I have interviewed and examined the patient and agree with the documentation,  as recorded by the Physician extender.   Was called by PCCM - they recommend home hospice, ordered case management and Pall care consult.   Leroy Sea M.D on 01/20/2013 at 1:05 PM  [facility] Office  [phone]

## 2013-01-20 NOTE — Progress Notes (Signed)
Patient is currently active with [facility] Care Management for chronic disease management services.  Patient has been engaged by a [employer].  Our community based plan of care has focused on disease management of CHF and community resource support.  He currently lives alone and has a son in [place] who is active with his care.  He uses [employer] for oxygen, but currently does not have home health.  Patient will receive a post discharge transition of care call and will be evaluated for monthly home visits for assessments and disease process education.  Made inpatient Case Manager aware that [facility] Care Management following. Of note, [facility] Care Management services does not replace or interfere with any services that are arranged by inpatient case management or social work.  For additional questions or referrals please contact Anibal Henderson BSN RN [facility] Liaison at [phone].

## 2013-01-20 NOTE — Progress Notes (Signed)
[facility] Progress Note [place] TEAM 1 - Stepdown/ICU TEAM   ATILLA ZOLLNER ION:[id number] DOB: Dec 09, 1935 DOA: 01/14/2013 PCP: Lucila [name], MD  Brief narrative: 77 year old male with COPD and pulmonary fibrosis on chronic oxygen between 6-8 L per minute. He also has combined diastolic and systolic heart failure. Last echocardiogram at the end of 2013 revealed improved ejection fraction of 50%.  He had a dual-chamber ICD placed. He is on chronic prednisone. He was last seen in the office by [name] in February of this year with dyspnea and hypoxemia felt to be related to progressive pulmonary fibrosis. He was given a burst of steroids and tapered back down to his usual prednisone dose with plans to followup in 2 months. He was seen by [name] in the office at the beginning of April at which point he was having increasing dyspnea and some increasing lower extremity edema in the setting of recent increase in steroids as noted above. He had noticed swelling and took extra Lasix which seemed to help with his shortness of breath therefore his Lasix dosage had been increased utilizing an alternating day schedule.   He presented to [facility] because of progressive dyspnea and apparent worsening productive cough. CT of the chest was negative for PE and demonstrated stable interstitial and alveolar changes. ABG revealed PO2 of 50 and he was put on 100% nonrebreather mask with improvement in saturations. The patient requested to be transferred to [facility] so he could be cared for by his his cardiology and pulmonology physicians.  Assessment/Plan:  Acute on chronic respiratory failure with hypoxia due to:   A) Pulmonary fibrosis   B) COPD with emphysema   C) poor understanding/problem solving regarding O2 therapy as outpt  -PCT normal  -CXR and CT chest not c/w PNA, therefore abx were stopped  -continue steroids as per home dose -having recurrent issues with  functioning of O2 equipment at home - pulmonary has contacted pt's home care service to explore options - possible discharge in a.m. 4/22 if arrangements can be made to assure equipment at home is functional - HH  Face-to -face completed for [facility] and aide/PT safety eval -wean patient to nasal cannula oxygen as able- normally runs 6L rest 8L exertion -Dr. Marchelle Gearing with Pulmonary medicine documents pt's severe resp and functional decline and plans Palliative discussion with pt today (4/22)- pending this discussion will need to adjust dc plans and possibly disposition  Chronic mild diastolic and systolic heart failure -compensated at present/pt actually volume depleted 2/2 recent Lasix increase -Slowly begin to resume home medications and follow closely -Cozaar dc'd this admit- cont Bystolic - plan not to resume Lasix after dc unless volume overload becomes apparent  Mild elevation in cardiac enzymes -suspect demand ischemia  -appreciate Cards eval - no further workup felt to be indicated  ARF on CKD (chronic kidney disease) stage 3, GFR 30-59 ml/min -due to recent low volume from Lasix -Much improved with gentle hydration  Hypovolemia / dehydration -Resolved -held diuretics and gave IVF cautiously  Left foot pain and swelling -endorsed pre admit injury and had walking boot at home -PT/OT following  CAD -stable and no true ischemic sx's  -cont ASA and Plavix  Diabetes mellitus with peripheral vascular disease -controlled on Levemir- dose decreased this admission  Hyperkalemia -due to [facility] and ARF -Resolved  [employer] dual-chamber ICD  DVT prophylaxis: Subcutaneous heparin Code Status: Full Family Communication: Patient Disposition Plan:Telemetry-  Defer conversation re: pt's prognosis with family  to Dr. Marchelle Gearing  Isolation: None  Consultants: Pulmonary Cardiology  Procedures: 4/17 - TTE - ejection fraction 45-50% with grade 1 diastolic  dysfunction  Antibiotics: Zithromax 4/16 >>> 4/17 Rocephin 4/16 >>> 4/17  HPI/Subjective: Awakened and asymptomatic at rest. Aware Dr. Marchelle Gearing will be speaking with him today regarding his prognosis and disposition  Objective: Blood pressure 118/72, pulse 72, temperature 98.6 F (37 C), temperature source Oral, resp. rate 22, height 5\' 6"  (1.676 m), weight 78.2 kg (172 lb 6.4 oz), SpO2 96.00%.  Intake/Output Summary (Last 24 hours) at 01/20/13 1240 Last data filed at 01/20/13 0900  Gross per 24 hour  Intake    220 ml  Output    625 ml  Net   -405 ml   Exam: General: No acute respiratory distress on Venturi mask Lungs: Fine crackles throughout all fields with no wheeze Cardiovascular: Regular rate and rhythm without murmur gallop or rub  Abdomen: Nontender, nondistended, soft, bowel sounds positive, no rebound, no ascites, no appreciable mass Musculoskeletal: No significant cyanosis of bilateral lower extremities  Data Reviewed: Basic Metabolic Panel:  Recent Labs Lab 01/14/13 1115  01/15/13 1627 01/16/13 0500 01/17/13 0510 01/18/13 0550 01/19/13 0620  NA  --   < > 138 140 140 139 142  K  --   < > 5.0 4.6 4.6 4.0 3.9  CL  --   < > 107 107 105 104 107  CO2  --   < > 22 21 25 27 27   GLUCOSE  --   < > 118* 71 102* 80 112*  BUN  --   < > 24* 22 19 22  24*  CREATININE 1.79*  < > 1.48* 1.39* 1.25 1.37* 1.38*  CALCIUM  --   < > 8.9 8.9 9.3 9.4 9.4  MG 2.1  --   --   --   --   --   --   < > = values in this interval not displayed. Liver Function Tests:  Recent Labs Lab 01/15/13 0454 01/16/13 0500  AST 38* 27  ALT 21 18  ALKPHOS 26* 22*  BILITOT 0.4 0.5  PROT 6.2 5.9*  ALBUMIN 2.8* 2.6*   CBC:  Recent Labs Lab 01/14/13 1115 01/15/13 0454 01/16/13 0500 01/18/13 0550  WBC 12.0* 7.7 8.3 6.7  HGB 12.8* 11.8* 11.4* 10.1*  HCT 38.8* 35.8* 34.1* 30.7*  MCV 89.6 91.6 91.2 90.3  PLT 145* 114* 88* 131*   Cardiac Enzymes:  Recent Labs Lab 01/14/13 1100  01/14/13 1653 01/14/13 2217 01/15/13 1300 01/15/13 1626  TROPONINI 0.88* 0.59* 0.32* <0.30 <0.30   BNP (last 3 results)  Recent Labs  03/10/12 1605 01/14/13 1100  PROBNP 238.0* 1684.0*   CBG:  Recent Labs Lab 01/19/13 1209 01/19/13 1724 01/19/13 2212 01/20/13 0804 01/20/13 1207  GLUCAP 156* 156* 105* 93 123*    Recent Results (from the past 240 hour(s))  CULTURE, BLOOD (ROUTINE X 2)     Status: None   Collection Time    01/14/13 11:00 AM      Result Value Range Status   Specimen Description BLOOD RIGHT ARM   Final   Special Requests BOTTLES DRAWN AEROBIC ONLY [facility]   Final   Culture  Setup Time 01/14/2013 14:31   Final   Culture NO GROWTH 5 DAYS   Final   Report Status 01/20/2013 FINAL   Final  CULTURE, BLOOD (ROUTINE X 2)     Status: None   Collection Time    01/14/13 11:15 AM  Result Value Range Status   Specimen Description BLOOD RIGHT THUMB   Final   Special Requests BOTTLES DRAWN AEROBIC ONLY [facility]   Final   Culture  Setup Time 01/14/2013 14:31   Final   Culture NO GROWTH 5 DAYS   Final   Report Status 01/20/2013 FINAL   Final  MRSA PCR SCREENING     Status: None   Collection Time    01/14/13 11:55 AM      Result Value Range Status   MRSA by PCR NEGATIVE  NEGATIVE Final   Comment:            The GeneXpert MRSA Assay (FDA     approved for NASAL specimens     only), is one component of a     comprehensive MRSA colonization     surveillance program. It is not     intended to diagnose MRSA     infection nor to guide or     monitor treatment for     MRSA infections.     Studies:  Recent x-ray studies have been reviewed in detail by the Attending Physician  Scheduled Meds:  Reviewed in detail by the Attending Physician   Junious Silk, ANP [facility] Office  [phone] Pager [phone]  On-Call/Text Page:      [email or web address]      password [email or web address]  If 7PM-7AM, please contact night-coverage [email or web address] Password [facility] 01/20/2013,  12:40 PM   LOS: 6 days

## 2013-01-20 NOTE — Progress Notes (Addendum)
PULMONARY  / CRITICAL CARE MEDICINE  Name: [name] MRN: [id number] DOB: 1936/02/11    ADMISSION DATE:  01/14/2013 CONSULTATION DATE:  01/15/13  REFERRING MD :  Dr. Sharon Seller PRIMARY SERVICE:  TRH  CHIEF COMPLAINT:  Fall / AMS / Hypoxia  BRIEF PATIENT DESCRIPTION: 77 y/o M admitted to [facility] on 4/16 PM from [facility] after his oxygen "ran out" and blacked out at home, falling (striking foot).  Work up at [employer] included at CT of chest which was negative for PE but had R midlung infiltrate, ABG with PaO2 of 50, increased WBC.    SIGNIFICANT EVENTS / STUDIES:    LINES / TUBES:   CULTURES: 4/16 BCx2>>>neg 4/16 Sputum>>>not done  ANTIBIOTICS:   SUBJECTIVE: RN reports no acute events.  Pt continues to tell the same story about his machine "being blocked".    VITAL SIGNS: Temp:  [97.7 F (36.5 C)-98.6 F (37 C)] 98.6 F (37 C) (04/22 0606) Pulse Rate:  [69-72] 72 (04/22 0606) Resp:  [22] 22 (04/22 0606) BP: (113-120)/(70-77) 118/72 mmHg (04/22 0606) SpO2:  [89 %-98 %] 96 % (04/22 0606) Weight:  [78.2 kg (172 lb 6.4 oz)] 78.2 kg (172 lb 6.4 oz) (04/22 0545)  PHYSICAL EXAMINATION: General:  Chronically ill elderly male in NAD Neuro:  AAOx4, speech clear, MAE HEENT:  Mm pink / moist, no jvd Cardiovascular:  s1s2 rrr, distant Lungs:  resp's even/non-labored at rest, lungs bilaterally with fine crackles Abdomen:  Obese/soft, bsx4 active Musculoskeletal:  No acute deformities, LLE with bruising s/p fall (appears old) Skin:  Warm/dry, minimal LE edema   Recent Labs Lab 01/17/13 0510 01/18/13 0550 01/19/13 0620  NA 140 139 142  K 4.6 4.0 3.9  CL 105 104 107  CO2 25 27 27   BUN 19 22 24*  CREATININE 1.25 1.37* 1.38*  GLUCOSE 102* 80 112*    Recent Labs Lab 01/15/13 0454 01/16/13 0500 01/18/13 0550  HGB 11.8* 11.4* 10.1*  HCT 35.8* 34.1* 30.7*  WBC 7.7 8.3 6.7  PLT 114* 88* 131*   IMAGING:  CXR 4/16 showed cardiomeg, s/p CABG, AICD; pulm fibrosis,  NAD...   ASSESSMENT / PLAN:  Pulmonary Fibrosis on chronic O2 - wears 6L at rest and 8L with activity COPD - without evidence of acute exacerbation  Plan: -O2 to support sats >88% => currently sating at 96% on 6L O2 -investigate home O2 set up.  Would like to have [employer] go to his house and re-instruct him on how to change humidity bottle and use equipment. He is fixated on this story and is very difficult to discuss care.  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  He notes stories of being in the dark and could figure out how to turn on the oxygen when the power was out.   -follow up with [name] in office post discharge.      Brett Canales Minor ACNP [facility] Pollack PCCM Pager [phone] till 3 pm If no answer page [phone] 01/20/2013, 9:23 AM   STAFF NOTE: I, Dr Lavinia Sharps have personally reviewed patient's available data, including medical history, events of note, physical  examination and test results as part of my evaluation. I have discussed with resident/NP and other care providers such as pharmacist, RN and RRT.  In addition,  I personally evaluated patient and elicited key findings of pulmonary fibrosis not otherwise specified but possible IPF. Associated with COPD and chronic respiratory failure. At baseline he is on 4-6 L of oxygen as one year ago but currently on 6-8 L of oxygen with class IV dyspnea. I had a detailed goals of care discussion with him, his son and daughter-in-law. He clearly states that after his wife passed away from acute respiratory failure and chronic lung disease that he does not want to  be intubated. he is hopeful for a good quality of life for the remainder of his natural life. He suspects that he will eventually died from his lung disease in the order of months or years although he is not certain when. I discussed Medicare hospice benefit with him in detail and he prefers this over home physical therapy or [facility]. He is open and excited about the idea about oral morphine to control dyspnea which we can coordinate through home hospice. I discussed with his primary pulmonologist Dr. Shan Levans who is in support of this plan. We will ask if his primary care physician can be the hospice attending and if not Dr. Shan Levans will be the hospice attending. Fu Dr Jonni Sanger 02/25/13 at 14.30 PCCM will sign off.  Rest per NP/medical resident whose note is outlined above and that I agree with      Dr. Kalman Shan, M.D., [facility].C.P Pulmonary and Critical Care Medicine Staff Physician [facility] Pager: [phone], If no answer or between  15:00h - 7:00h: call [phone]  01/20/2013 4:12 PM

## 2013-01-20 NOTE — Progress Notes (Signed)
01/20/2013 1630 NCM faxed referral to [facility]. Received confirmation referral was received. They will follow up with pt today for dc on 4/23. Isidoro Donning RN CCM Case Mgmt phone [phone]

## 2013-01-20 NOTE — Progress Notes (Signed)
Patient states he has had home health services with [facility], patient has decided he wants Hospice at home now, patient's PCP is Vickii Penna at [facility].

## 2013-01-21 DIAGNOSIS — E1159 Type 2 diabetes mellitus with other circulatory complications: Secondary | ICD-10-CM

## 2013-01-21 DIAGNOSIS — I798 Other disorders of arteries, arterioles and capillaries in diseases classified elsewhere: Secondary | ICD-10-CM

## 2013-01-21 LAB — GLUCOSE, CAPILLARY: Glucose-Capillary: 171 mg/dL — ABNORMAL HIGH (ref 70–99)

## 2013-01-21 MED ORDER — MORPHINE SULFATE 20 MG/5ML PO SOLN: 2.5000 mg | ORAL | Status: AC | PRN

## 2013-01-21 NOTE — Progress Notes (Signed)
Pt d/c instructions reviewed with pt and sons. Pt given copy of instructions and script. Pt d/c'd via wheelchair with belongings, on O2 at 6L,(pt has his O2 tank in son's car), d/c'd with son, escorted by unit NT.

## 2013-01-21 NOTE — Progress Notes (Signed)
Notified Schorr, NP via text page that patient had 5 beats of Vtach. Patient is asympathomatic and is asleep. No new orders given. Will continue to monitor patient. Nelda Marseille, RN

## 2013-01-21 NOTE — Progress Notes (Signed)
I have reviewed and agree with this note. Cathy Mallerie Blok, OTR/L [phone] 01/21/2013   

## 2013-01-21 NOTE — Care Management Note (Addendum)
    Page 1 of 2   01/21/2013     2:34:49 PM   CARE MANAGEMENT NOTE 01/21/2013  Patient:  [name], [name]   Account Number:  [id number]  Date Initiated:  01/20/2013  Documentation initiated by:  [facility]  Subjective/Objective Assessment:   hypoxia, oxygen dependent, Pulm Fibrosis, COPD     Action/Plan:   lives alone, desires to go back [facility]  PT recommending HH PT   Anticipated DC Date:  01/21/2013   Anticipated DC Plan:  HOME W HOSPICE CARE      DC Planning Services  CM consult      PAC Choice  HOSPICE   Choice offered to / List presented to:  C-1 Patient        HH arranged  HH-1 RN      [facility]   Status of service:  Completed, signed off Medicare Important Message given?   (If response is "NO", the following Medicare IM given date fields will be blank) Date Medicare IM given:   Date Additional Medicare IM given:    Discharge Disposition:  HOME W HOSPICE CARE  Per UR Regulation:  Reviewed for med. necessity/level of care/duration of stay  If discussed at Long Length of Stay Meetings, dates discussed:    Comments:  01/21/13 14:28 Letha Cape RN, BSN [phone] patient dc home with [facility], also patient has oxygen with [employer], I contacted [employer] and they state they will have a rep to go to patient's home today to help with the humidifiation issued. Informed patient to call [employer] as soon as they get home and also  call [facility].  01/20/2013 1630 NCM faxed referral to [facility]. Received confirmation referral was received. They will follow up with pt today for dc on 4/23. Isidoro Donning RN CCM Case Mgmt phone [phone]   01/20/2013 3:23 PM  Patient states he has had home health services with [facility], patient has decided he wants Hospice at home now, patient's PCP is Vickii Penna at [facility].   01/19/2013 1200 NCM spoke to pt and states he lives alone.  He believes his oxygen is malfunctioning. Pt gave permission to speak to son or daughter. States he has HH agency.  Contacted [employer] and they will bring tanks to room for dc. [employer] in room and pt states he has his own tanks at home. He wants his son to go to his home to pick up and bring back to hosp. NCM contacted son, [name] # [phone]. States he does not know the name of pt's HH agency. States wants a call from MD about father. Message to attending to call son. Isidoro Donning RN CCM Case Mgmt phone [phone]

## 2013-01-21 NOTE — Progress Notes (Signed)
Occupational Therapy Treatment Patient Details Name: [name] MRN: [id number] DOB: 1936/08/31 Today's Date: 01/21/2013 Time: 7829-5621 OT Time Calculation (min): 27 min  OT Assessment / Plan / Recommendation Comments on Treatment Session Pt is progressing.  Pt able to ambulate with sats remaining bewtween upper 70s and 80s until left bathroom to recliner stats dropped to 60.  Pt VC'd to purse lip breath.  O2 went back up to 80s.(all while on 8 liters of O2)  Pt will be recieving hospice at home    Follow Up Recommendations  Home health OT;Supervision/Assistance - 24 hour       Equipment Recommendations  3 in 1 bedside comode;Hospital bed       Frequency Min 2X/week   Plan Discharge plan remains appropriate    Precautions / Restrictions Precautions Precautions: Fall Restrictions Weight Bearing Restrictions: No LLE Weight Bearing: Weight bearing as tolerated Other Position/Activity Restrictions: Pt has a walking boot from previous foot fracture, his current issue is torn/strained ligaments       ADL  Toilet Transfer: Performed;Min guard Toilet Transfer Method: Sit to stand Toilet Transfer Equipment: Raised toilet seat with arms (or 3-in-1 over toilet);Grab bars Toileting - Clothing Manipulation and Hygiene: Performed;Min guard Where Assessed - Glass blower/designer Manipulation and Hygiene: Standing Equipment Used:  (walking boot) Transfers/Ambulation Related to ADLs: Pt ambulated from bed>bathroom>recliner with min guard A.  Required cues for hand placement when standing from bed. ADL Comments: Pt was able to manage clothing with Min guard A when preparing to toliet.  Pt was Supervision/safety when [employer] clothing from [employer] and completing toliet hygiene        OT Goals ADL Goals ADL Goal: Statistician - Progress: Progressing toward goals ADL Goal: Toileting - Clothing Manipulation - Progress: Progressing toward goals ADL Goal: Additional Goal #1 - Progress:  Progressing toward goals  Visit Information  Last OT Received On: 01/21/13 Assistance Needed: +1    Subjective Data  Subjective: This feels good(regarding being up and moving)      Cognition  Cognition Arousal/Alertness: Awake/alert Behavior During Therapy: WFL for tasks assessed/performed Overall Cognitive Status: Within Functional Limits for tasks assessed    Mobility  Bed Mobility Bed Mobility: Supine to Sit;Sitting - Scoot to Edge of Bed Supine to Sit: With rails;HOB elevated;6: Modified independent (Device/Increase time) Sitting - Scoot to Edge of Bed: 5: Supervision Details for Bed Mobility Assistance: Stats stayed between 77-80s (on 6 liters--bumped him up to 8 liters since he uses this much at home at times) when going from supine to sit.   Transfers Transfers: Sit to Stand;Stand to Sit Sit to Stand: From bed;From toilet;With upper extremity assist;4: Min guard Stand to Sit: With upper extremity assist;With armrests;To toilet;To chair/3-in-1;4: Min guard Details for Transfer Assistance: Pt was able to transfer with min guard A with walking boot applied by family and rw       Balance Balance Balance Assessed: Yes Static Sitting Balance Static Sitting - Balance Support: Feet supported Static Sitting - Level of Assistance: 7: Independent Static Sitting - Comment/# of Minutes: while son applied walking boot   End of Session OT - End of Session Equipment Utilized During Treatment: Gait belt;Other (comment) (walking boot) Activity Tolerance: Patient tolerated treatment well Patient left: in chair;with call bell/phone within reach;with family/visitor present       Dietrich Pates 01/21/2013, 12:57 PM

## 2013-02-02 ENCOUNTER — Encounter: Payer: Medicare Other | Admitting: *Deleted

## 2013-02-09 ENCOUNTER — Telehealth: Payer: Self-pay | Admitting: Internal Medicine

## 2013-02-09 NOTE — Telephone Encounter (Signed)
New problem    Recent in hospital    Losartan was stop. Patient feels that the losartan need to be re-started.   bystolic 2.5 mg is not a formulary . Need an alternative.

## 2013-02-09 NOTE — Telephone Encounter (Signed)
Spoke with wendy, bystolic is not on the formulary for hospice. The pt has been on both losartan and bystolic for some time and is concerned about stopping the losartan. His bp is fine on just the bystolic alone. They want to know about changing the bystolic to something else. Aware will discuss with dr Graciela Husbands tomorrow and call back.

## 2013-02-10 NOTE — Telephone Encounter (Signed)
Discussed with dr Graciela Husbands, okay to stop bystolic and restart the losartan 25 mg once daily. Left message for wendy to call.

## 2013-02-11 NOTE — Telephone Encounter (Signed)
Spoke with wendy, aware of dr [employer] recommendations.

## 2013-02-12 ENCOUNTER — Telehealth: Payer: Self-pay | Admitting: *Deleted

## 2013-02-12 NOTE — Telephone Encounter (Signed)
Received fax from [facility] in [place] stating Advair 250/50 needs to be approved through insurance as pt is now hospice. I do not see where we have referred pt to hospice, but he does live in [place]. I called [facility], spoke with [place].  They are seeing pt for pulmonary fibrosis and copd.  Dr. Lorin Picket is the hospice attending.  Per Cala Bradford, as pt has Medicare Part D and is now in hospice, Hospice will provide and cover the Advair for pt.  She will need a rx so hospice pharmacia can ship medication to pt.  Cala Bradford ok with VO.  I gave advair 250/50 1 puff bid #60 x 6 to [place] who verbalized understanding.  She will have [facility] get medication to pt and will also inform pt of this.    I spoke with Foye Clock at [facility].  She is aware Hospice will be supplying the Advair to pt.  She verbalized understanding and voiced no further questions or concerns at this time.  I called, spoke with pt.  Informed him Advair will be provided to him through the hospice pharm.  He verbalized understanding and voiced no further questions or concerns at this time.

## 2013-02-13 ENCOUNTER — Encounter: Payer: Self-pay | Admitting: *Deleted

## 2013-02-17 ENCOUNTER — Other Ambulatory Visit: Payer: Medicare Other

## 2013-02-17 ENCOUNTER — Ambulatory Visit: Payer: Medicare Other | Admitting: Vascular Surgery

## 2013-02-17 ENCOUNTER — Telehealth: Payer: Self-pay | Admitting: Internal Medicine

## 2013-02-17 NOTE — Telephone Encounter (Signed)
Spoke with [name], the pt is in hospice and zetia is not on their formulary and they require prior auth. Per dr Graciela Husbands zetia can be stopped.

## 2013-02-17 NOTE — Telephone Encounter (Signed)
New Problem:    Called in wanting to speak with someone in regards to receiving a prior auth for the patient's ezetimibe (ZETIA) 10 MG tablet.  Please call back.

## 2013-02-19 ENCOUNTER — Telehealth: Payer: Self-pay | Admitting: Critical Care Medicine

## 2013-02-19 NOTE — Telephone Encounter (Signed)
I spoke with [facility] and is aware. She needed nothing further

## 2013-02-19 NOTE — Telephone Encounter (Signed)
Yes increase pred 20mg  /d

## 2013-02-19 NOTE — Telephone Encounter (Signed)
lmomtcb x1 for wendi 

## 2013-02-19 NOTE — Telephone Encounter (Signed)
I spoke with Wendi. She staetd when pt was d/c from the hospital they told him to take pred 10 mg daily. Pt is having increase SOB and is wanting to know if he can increase to 20 mg daily. Per Ursula Alert he was taking Pred 20mg  daily before he was admitted. Please advise [name] thanks  Allergies  Allergen Reactions  . Sulfa Antibiotics Nausea And Vomiting    This occurred when pt was 77 years old

## 2013-02-20 ENCOUNTER — Encounter: Payer: Self-pay | Admitting: Vascular Surgery

## 2013-02-24 ENCOUNTER — Telehealth: Payer: Self-pay | Admitting: Internal Medicine

## 2013-02-24 ENCOUNTER — Ambulatory Visit: Payer: Medicare Other | Admitting: Vascular Surgery

## 2013-02-24 NOTE — Telephone Encounter (Signed)
New Prob      Needing prior approval for PLAVIX PROTONICS. Will be faxing over request. Nurse states physician only has 3 days to sign and fax back.

## 2013-02-25 ENCOUNTER — Encounter: Payer: Self-pay | Admitting: Critical Care Medicine

## 2013-02-25 ENCOUNTER — Ambulatory Visit (INDEPENDENT_AMBULATORY_CARE_PROVIDER_SITE_OTHER): Payer: Medicare Other | Admitting: Critical Care Medicine

## 2013-02-25 ENCOUNTER — Telehealth: Payer: Self-pay | Admitting: Critical Care Medicine

## 2013-02-25 VITALS — BP 116/74 | HR 78 | Temp 97.3°F | Ht 66.5 in | Wt 181.2 lb

## 2013-02-25 DIAGNOSIS — J8410 Pulmonary fibrosis, unspecified: Secondary | ICD-10-CM

## 2013-02-25 MED ORDER — ALBUTEROL SULFATE (2.5 MG/3ML) 0.083% IN NEBU: 2.5000 mg | INHALATION_SOLUTION | Freq: Four times a day (QID) | RESPIRATORY_TRACT | Status: AC

## 2013-02-25 MED ORDER — IPRATROPIUM BROMIDE 0.02 % IN SOLN: 500.0000 ug | Freq: Four times a day (QID) | RESPIRATORY_TRACT | Status: AC

## 2013-02-25 NOTE — Telephone Encounter (Signed)
Called, spoke with Toniann Fail, pt's hospice nurse.  Pt has an appt with PW today.  Toniann Fail is calling to give some info for today's OV.   1.  States pt doesn't feel Spiriva is helping. 2.  Pt's o2 sat goes into the 60s on 8lpm when he is walking to the bathroom. He has a chair he uses to rest in the hallway d/t the SOB.  Also, his sats drop into the 80's on 8lpm when he just stands from sitting in his recliner.  3.  He is using albuterol qd but is still SOB.  ? Should meds be changed or increased?   4.  Toniann Fail has offered to have an aid come in to help with his baths but pt has declined this service.  Advised I would send msg to [name] as FYI to address during OV.  Toniann Fail verbalized understanding and was very appreciative of our help.

## 2013-02-25 NOTE — Telephone Encounter (Signed)
I spoke with [name] and informed her of today's OV recs per below:  Patient Instructions    Stay on oxygen 8Liter rest and 8-10 L exertion, take out hose length  Stop advair and spiriva  Start Albuterol and ipratroprium in nebulizer 4 times daily and as needed  No other medication changes  An aide will assist you with your bath   ----  [name] verbalized understanding, was very appreciative of this help, and voiced no further questions or concerns at this time.

## 2013-02-25 NOTE — Patient Instructions (Addendum)
Stay on oxygen 8Liter rest and 8-10 L exertion, take out hose length  Stop advair and spiriva Start Albuterol and ipratroprium in nebulizer 4 times daily and as needed No other medication changes An aide will assist you with your bath

## 2013-02-25 NOTE — Progress Notes (Signed)
Subjective:    Patient ID: [name], male    DOB: 07/10/1936, 77 y.o.   MRN: [id number]  HPI  77 y.o.WM  Hosp 7/10 -04/15/11 for CHF, resp failure, Ischemic CM 45-50%   COPD/ Pulm Fibrosis  D/c on oxygen.  CT neg for PE,  pulm infiltrates/fibrosis  02/25/2013 Pt now in hospice.  Just in hospital for desats and fluid retention.  No PNA.  Pt d/c to hospice care in home. Now is sl better. Less cough.  Needs assistance for bath Needs med reconciliation   Past Medical History  Diagnosis Date  . CAD (coronary artery disease)     s/p CABG; s/p redo CABG in 1997 and 2007; cath 4/10: LM occluded, CFX occluded, old S-RCA occluded, old S-RCA 90%, left radial to RCA ok, S-CFX ok, L-LAD ok, 2 old SVGs stumped  . Hypertension   . Hyperlipidemia   . Ischemic cardiomyopathy     echo 7/12: EF 45-50%, severe LVH, mild LAE; EF 50% per echo 2/13  . Chronic systolic heart failure     EF is 24% in the past; EF 45-50% in 7/12 and up to 50% in Feb 2013  . Carotid stenosis     doppler 11/11: 20-39% bilat.  . ICD (implantable cardiac defibrillator) in place 2010  . Hypertensive heart disease   . COPD (chronic obstructive pulmonary disease)   . History of stroke   . Hiatal hernia   . AAA (abdominal aortic aneurysm)     followed by [facility]  . Pulmonary fibrosis     followed by [name]; on chronic oxygen.   . Prolonged grief reaction   . Diabetes mellitus, insulin dependent (IDDM), controlled   . CHF (congestive heart failure)   . Myocardial infarction   . Stroke   . Shortness of breath     on home O2  . Rectal bleed February 2014     Family History  Problem Relation Age of Onset  . Heart attack Brother   . Coronary artery disease Brother   . Coronary artery disease Brother   . Heart disease Brother   . Sudden death Father   . Sudden death Mother      History   Social History  . Marital Status: Widowed    Spouse Name: N/A    Number of Children: N/A  . Years of Education: N/A    Occupational History  . Not on file.   Social History Main Topics  . Smoking status: Former Smoker -- 1.00 packs/day for 47 years    Types: Cigarettes    Quit date: 03/12/2003  . Smokeless tobacco: Never Used  . Alcohol Use: No  . Drug Use: No  . Sexually Active: No   Other Topics Concern  . Not on file   Social History Narrative  . No narrative on file     Allergies  Allergen Reactions  . Sulfa Antibiotics Nausea And Vomiting    This occurred when pt was 77 years old      Outpatient Prescriptions Prior to Visit  Medication Sig Dispense Refill  . aspirin 81 MG tablet Take 81 mg by mouth daily.        Marland Kitchen atorvastatin (LIPITOR) 40 MG tablet Take 1 tablet (40 mg total) by mouth daily.  30 tablet  11  . clopidogrel (PLAVIX) 75 MG tablet TAKE 1 TABLET ONCE DAILY.  30 tablet  6  . FLUoxetine (PROZAC) 40 MG capsule Take 40 mg by mouth daily.      Marland Kitchen  Insulin Pen Needle (PEN NEEDLES 31GX5/16") 31G X 8 MM MISC       . Multiple Vitamin (MULTIVITAMIN WITH MINERALS) TABS Take 1 tablet by mouth daily.      . nateglinide (STARLIX) 60 MG tablet Take 60 mg by mouth daily as needed (when eating heavy meals).       . NON FORMULARY 8L 24/7      . pantoprazole (PROTONIX) 40 MG tablet Take 1 tablet (40 mg total) by mouth 2 (two) times daily.  60 tablet  5  . albuterol (PROVENTIL) (2.5 MG/3ML) 0.083% nebulizer solution Take 2.5 mg by nebulization daily.       . Fluticasone-Salmeterol (ADVAIR DISKUS) 250-50 MCG/DOSE AEPB Inhale 1 puff into the lungs 2 (two) times daily.  60 each  6  . insulin detemir (LEVEMIR) 100 UNIT/ML injection Inject 0.16 mLs (16 Units total) into the skin daily.  10 mL    . predniSONE (DELTASONE) 10 MG tablet Take 10 mg daily  30 tablet  0  . SPIRIVA HANDIHALER 18 MCG inhalation capsule PLACE 1 CAPSULE INTO INHALER AND INHALE DAILY.  30 each  6  . morphine 20 MG/5ML solution Take 0.6 mLs (2.4 mg total) by mouth every 4 (four) hours as needed for pain.  100 mL  0  .  nebivolol (BYSTOLIC) 2.5 MG tablet Take 2.5 mg by mouth daily.       No facility-administered medications prior to visit.      Review of Systems  Constitutional:   No  weight loss, night sweats,  Fevers, chills, fatigue, lassitude. HEENT:   No headaches,  Difficulty swallowing,  Tooth/dental problems,  Sore throat,                No sneezing, itching, ear ache, nasal congestion, post nasal drip,   CV:  No chest pain,  Orthopnea, PND, swelling in lower extremities, anasarca, dizziness, palpitations  GI  No heartburn, indigestion, abdominal pain, nausea, vomiting, diarrhea, change in bowel habits, loss of appetite  Resp: Notes  shortness of breath with exertion and   at rest.  No excess mucus, no productive cough,  No non-productive cough,  No coughing up of blood.  No change in color of mucus.  No wheezing.  No chest wall deformity  Skin: no rash or lesions.  GU: no dysuria, change in color of urine, no urgency or frequency.  No flank pain.  MS:  No joint pain or swelling.  No decreased range of motion.  No back pain.  Psych:  No change in mood or affect. No depression or anxiety.  No memory loss.     Objective:   Physical Exam  Filed Vitals:   02/25/13 1420  BP: 116/74  Pulse: 78  Temp: 97.3 F (36.3 C)  TempSrc: Oral  Height: 5' 6.5" (1.689 m)  Weight: 181 lb 3.2 oz (82.192 kg)  SpO2: 96%    Gen: Pleasant, well-nourished, in no distress,  normal affect  ENT: No lesions,  mouth clear,  oropharynx clear, no postnasal drip  Neck: No JVD, no TMG, no carotid bruits  Lungs: No use of accessory muscles, no dullness to percussion, distant bs , dry rales at bases  Cardiovascular: RRR, heart sounds normal, no murmur or gallops, no peripheral edema  Abdomen: soft and NT, no HSM,  BS normal  Musculoskeletal: No deformities, no cyanosis or clubbing  Neuro: alert, non focal  Skin: Warm, no lesions or rashes        Assessment &  Plan:   Pulmonary fibrosis End  stage pulmonary fibrosis with chronic hypoxemic respiratory failure Plan Stay on oxygen 8Liter rest and 8-10 L exertion, take out hose length  Stop advair and spiriva Start Albuterol and ipratroprium in nebulizer 4 times daily and as needed No other medication changes Obtain personal aide for hygiene via Hospice   Note a flu vaccination was given on this visit 02/26/2013  Updated Medication List Outpatient Encounter Prescriptions as of 02/25/2013  Medication Sig Dispense Refill  . acetaminophen (TYLENOL) 500 MG tablet Take 500-1,000 mg by mouth every 4 (four) hours as needed for pain.      Marland Kitchen albuterol (PROVENTIL) (2.5 MG/3ML) 0.083% nebulizer solution Take 3 mLs (2.5 mg total) by nebulization 4 (four) times daily. And as needed  270 mL  6  . aspirin 81 MG tablet Take 81 mg by mouth daily.        Marland Kitchen atorvastatin (LIPITOR) 40 MG tablet Take 1 tablet (40 mg total) by mouth daily.  30 tablet  11  . clopidogrel (PLAVIX) 75 MG tablet TAKE 1 TABLET ONCE DAILY.  30 tablet  6  . docusate sodium (COLACE) 100 MG capsule Take 100-200 mg by mouth 2 (two) times daily.      Marland Kitchen FLUoxetine (PROZAC) 40 MG capsule Take 40 mg by mouth daily.      . furosemide (LASIX) 40 MG tablet Take 1 tablet by mouth daily.      . insulin detemir (LEVEMIR) 100 UNIT/ML injection Inject 23 Units into the skin daily.      . Insulin Pen Needle (PEN NEEDLES 31GX5/16") 31G X 8 MM MISC       . losartan (COZAAR) 25 MG tablet Take 25 mg by mouth daily.       . Multiple Vitamin (MULTIVITAMIN WITH MINERALS) TABS Take 1 tablet by mouth daily.      . nateglinide (STARLIX) 60 MG tablet Take 60 mg by mouth daily as needed (when eating heavy meals).       . NON FORMULARY 8L 24/7      . pantoprazole (PROTONIX) 40 MG tablet Take 1 tablet (40 mg total) by mouth 2 (two) times daily.  60 tablet  5  . predniSONE (DELTASONE) 10 MG tablet Take 20 mg by mouth daily.      Marland Kitchen ZETIA 10 MG tablet Take 1 tablet by mouth daily.      . [DISCONTINUED]  albuterol (PROVENTIL) (2.5 MG/3ML) 0.083% nebulizer solution Take 2.5 mg by nebulization daily.       . [DISCONTINUED] Fluticasone-Salmeterol (ADVAIR DISKUS) 250-50 MCG/DOSE AEPB Inhale 1 puff into the lungs 2 (two) times daily.  60 each  6  . [DISCONTINUED] insulin detemir (LEVEMIR) 100 UNIT/ML injection Inject 0.16 mLs (16 Units total) into the skin daily.  10 mL    . [DISCONTINUED] predniSONE (DELTASONE) 10 MG tablet Take 10 mg daily  30 tablet  0  . [DISCONTINUED] SPIRIVA HANDIHALER 18 MCG inhalation capsule PLACE 1 CAPSULE INTO INHALER AND INHALE DAILY.  30 each  6  . ipratropium (ATROVENT) 0.02 % nebulizer solution Take 2.5 mLs (500 mcg total) by nebulization 4 (four) times daily.  270 mL  6  . morphine 20 MG/5ML solution Take 0.6 mLs (2.4 mg total) by mouth every 4 (four) hours as needed for pain.  100 mL  0  . [DISCONTINUED] nebivolol (BYSTOLIC) 2.5 MG tablet Take 2.5 mg by mouth daily.       No facility-administered encounter medications on file as  of 02/25/2013.

## 2013-02-25 NOTE — Telephone Encounter (Signed)
Noted  

## 2013-02-25 NOTE — Telephone Encounter (Signed)
Left message for wendy, I have not received a fax.

## 2013-02-26 NOTE — Assessment & Plan Note (Signed)
End stage pulmonary fibrosis with chronic hypoxemic respiratory failure Plan Stay on oxygen 8Liter rest and 8-10 L exertion, take out hose length  Stop advair and spiriva Start Albuterol and ipratroprium in nebulizer 4 times daily and as needed No other medication changes Obtain personal aide for hygiene via Hospice

## 2013-02-26 NOTE — Telephone Encounter (Signed)
Follow-up:    Called in to state that the patient's medication issuse have been resolved and please disregard the previous calls.

## 2013-03-02 ENCOUNTER — Other Ambulatory Visit: Payer: Medicare Other

## 2013-03-19 ENCOUNTER — Encounter: Payer: Self-pay | Admitting: Physician Assistant

## 2013-03-19 ENCOUNTER — Other Ambulatory Visit: Payer: Self-pay | Admitting: Emergency Medicine

## 2013-03-19 ENCOUNTER — Ambulatory Visit (INDEPENDENT_AMBULATORY_CARE_PROVIDER_SITE_OTHER): Payer: Medicare Other | Admitting: Physician Assistant

## 2013-03-19 VITALS — BP 116/69 | HR 82 | Ht 66.5 in | Wt 177.8 lb

## 2013-03-19 DIAGNOSIS — J8410 Pulmonary fibrosis, unspecified: Secondary | ICD-10-CM

## 2013-03-19 DIAGNOSIS — E785 Hyperlipidemia, unspecified: Secondary | ICD-10-CM

## 2013-03-19 DIAGNOSIS — I2510 Atherosclerotic heart disease of native coronary artery without angina pectoris: Secondary | ICD-10-CM

## 2013-03-19 DIAGNOSIS — I10 Essential (primary) hypertension: Secondary | ICD-10-CM

## 2013-03-19 DIAGNOSIS — I5042 Chronic combined systolic (congestive) and diastolic (congestive) heart failure: Secondary | ICD-10-CM

## 2013-03-19 MED ORDER — PANTOPRAZOLE SODIUM 40 MG PO TBEC: 40.0000 mg | DELAYED_RELEASE_TABLET | Freq: Two times a day (BID) | ORAL | Status: AC

## 2013-03-19 NOTE — Progress Notes (Signed)
[address], [address] [place], [place]  [address] Phone: [phone] Fax:  [phone]  Date:  03/19/2013   ID:  [name], DOB 1936/02/19, MRN [id number]  PCP:  Lucila [name], MD  Cardiologist:  Dr. Sherryl Manges     History of Present Illness: [name] is a 77 y.o. male who returns for f/u.  He has a history of CAD, status post CABG on 3 separate occasions, ischemic cardiomyopathy with improved EF, s/p AICD, GERD, pulmonary fibrosis, depression, DM2, known AAA status post prior endovascular stent repair in 2013, HTN, HL.  Last seen by Norma Fredrickson, NP 11/2012.  He was seen by [name] in 12/2012. He had some volume overload and his Lasix was adjusted. He was then admitted to [facility] in 12/2012 with acute on chronic hypoxic respiratory failure secondary to pulmonary fibrosis and COPD as well as malfunctioning oxygen unit. The patient was volume depleted and was hydrated. He did have a mild elevation in his cardiac enzymes. He was seen by cardiology. This was felt to be related to demand ischemia and no further workup was pursued. He did have worsening renal function and this improved with hydration. He was referred to hospice for his end-stage pulmonary fibrosis.  Echo 4/14: EF 45-50%, posterior HK, grade 1 diastolic dysfunction, trivial MR, mild to moderate LE, mild to moderately reduced RV SF, mild to moderate RAE.  Patient notes that his breathing is stable. He feels weak but is getting stronger. [facility] was at his house today and noted that his oxygen unit was not delivering 02. This was corrected. He denies chest pain, syncope, orthopnea, PND or LE edema.  Labs (4/14):  K 4.6, Cr 1.39, ALT 18, Hgb 10.1, PLT 131K, TSH 2.255  Wt Readings from Last 3 Encounters:  03/19/13 177 lb 12.8 oz (80.65 kg)  02/25/13 181 lb 3.2 oz (82.192 kg)  01/21/13 173 lb 6.4 oz (78.654 kg)     Past Medical History  Diagnosis Date  . CAD (coronary artery  disease)     s/p CABG; s/p redo CABG in 1997 and 2007; cath 4/10: LM occluded, CFX occluded, old S-RCA occluded, old S-RCA 90%, left radial to RCA ok, S-CFX ok, L-LAD ok, 2 old SVGs stumped  . Hypertension   . Hyperlipidemia   . Ischemic cardiomyopathy     echo 7/12: EF 45-50%, severe LVH, mild LAE; EF 50% per echo 2/13  . Chronic systolic heart failure     EF is 24% in the past; EF 45-50% in 7/12 and up to 50% in Feb 2013  . Carotid stenosis     doppler 11/11: 20-39% bilat.  . ICD (implantable cardiac defibrillator) in place 2010  . Hypertensive heart disease   . COPD (chronic obstructive pulmonary disease)   . History of stroke   . Hiatal hernia   . AAA (abdominal aortic aneurysm)     followed by [facility]  . Pulmonary fibrosis     followed by [name]; on chronic oxygen.   . Prolonged grief reaction   . Diabetes mellitus, insulin dependent (IDDM), controlled   . CHF (congestive heart failure)   . Myocardial infarction   . Stroke   . Shortness of breath     on home O2  . Rectal bleed February 2014    Current Outpatient Prescriptions  Medication Sig Dispense Refill  . acetaminophen (TYLENOL) 500 MG tablet Take 500-1,000 mg by mouth every 4 (four) hours as  needed for pain.      Marland Kitchen albuterol (PROVENTIL) (2.5 MG/3ML) 0.083% nebulizer solution Take 3 mLs (2.5 mg total) by nebulization 4 (four) times daily. And as needed  270 mL  6  . aspirin 81 MG tablet Take 81 mg by mouth daily.        Marland Kitchen atorvastatin (LIPITOR) 40 MG tablet Take 1 tablet (40 mg total) by mouth daily.  30 tablet  11  . clopidogrel (PLAVIX) 75 MG tablet TAKE 1 TABLET ONCE DAILY.  30 tablet  6  . docusate sodium (COLACE) 100 MG capsule Take 100-200 mg by mouth 2 (two) times daily.      Marland Kitchen FLUoxetine (PROZAC) 40 MG capsule Take 40 mg by mouth daily.      . furosemide (LASIX) 40 MG tablet Take 1 tablet by mouth daily.      . insulin detemir (LEVEMIR) 100 UNIT/ML injection Inject 23 Units into the skin daily.      . Insulin  Pen Needle (PEN NEEDLES 31GX5/16") 31G X 8 MM MISC       . ipratropium (ATROVENT) 0.02 % nebulizer solution Take 2.5 mLs (500 mcg total) by nebulization 4 (four) times daily.  270 mL  6  . losartan (COZAAR) 25 MG tablet Take 25 mg by mouth daily.       Marland Kitchen morphine 20 MG/5ML solution Take 0.6 mLs (2.4 mg total) by mouth every 4 (four) hours as needed for pain.  100 mL  0  . Multiple Vitamin (MULTIVITAMIN WITH MINERALS) TABS Take 1 tablet by mouth daily.      . nateglinide (STARLIX) 60 MG tablet Take 60 mg by mouth daily as needed (when eating heavy meals).       . nebivolol (BYSTOLIC) 2.5 MG tablet Take 2.5 mg by mouth daily.      . NON FORMULARY 8L 24/7      . predniSONE (DELTASONE) 10 MG tablet Take 20 mg by mouth daily.      Marland Kitchen ZETIA 10 MG tablet Take 1 tablet by mouth daily.      . pantoprazole (PROTONIX) 40 MG tablet Take 1 tablet (40 mg total) by mouth 2 (two) times daily.  60 tablet  5   No current facility-administered medications for this visit.    Allergies:    Allergies  Allergen Reactions  . Sulfa Antibiotics Nausea And Vomiting    This occurred when pt was 77 years old     Social History:  The patient  reports that he quit smoking about 10 years ago. His smoking use included Cigarettes. He has a 47 pack-year smoking history. He has never used smokeless tobacco. He reports that he does not drink alcohol or use illicit drugs.   ROS:  Please see the history of present illness.    All other systems reviewed and negative.   PHYSICAL EXAM: VS:  BP 116/69  Pulse 82  Ht 5' 6.5" (1.689 m)  Wt 177 lb 12.8 oz (80.65 kg)  BMI 28.27 kg/m2  SpO2 89% Well nourished, well developed, in no acute distress HEENT: normal Neck: no JVD at 90 Cardiac:  normal S1, S2; RRR; no murmur Lungs:  Decreased breath sounds bilaterally, no wheezing, rhonchi or rales Abd: soft, nontender, no hepatomegaly Ext: no edema Skin: warm and dry Neuro:  CNs 2-12 intact, no focal abnormalities noted  EKG:   NSR, HR 72, lateral T-wave inversions, no change from prior tracing     ASSESSMENT AND PLAN:  1. CAD:  No angina. Continue aspirin and statin. 2. Chronic Combined Systolic and Diastolic CHF:  Volume stable. Continue current therapy. 3. Pulmonary Fibrosis:  He is now followed by hospice. 4. Hypertension: Controlled. 5. Hyperlipidemia: Continue statin. 6. Disposition: Followup with Norma Fredrickson, NP in 3 mos.  Signed, Tereso Newcomer, PA-C  03/19/2013 2:17 PM

## 2013-03-19 NOTE — Patient Instructions (Addendum)
FOLLOW UP WITH LORI GERHARDT 06/09/13 @ 2 PM  NO CHANGES WERE MADE TODAY

## 2013-03-20 ENCOUNTER — Ambulatory Visit: Payer: Medicare Other | Admitting: Nurse Practitioner

## 2013-03-23 ENCOUNTER — Encounter: Payer: Self-pay | Admitting: Vascular Surgery

## 2013-03-24 ENCOUNTER — Other Ambulatory Visit: Payer: Self-pay | Admitting: Vascular Surgery

## 2013-03-24 ENCOUNTER — Ambulatory Visit (INDEPENDENT_AMBULATORY_CARE_PROVIDER_SITE_OTHER): Payer: Medicare Other | Admitting: Vascular Surgery

## 2013-03-24 ENCOUNTER — Ambulatory Visit
Admission: RE | Admit: 2013-03-24 | Discharge: 2013-03-24 | Disposition: A | Discharge status: Home / Self Care | Payer: Medicare Other | Source: Ambulatory Visit | Attending: Vascular Surgery | Admitting: Vascular Surgery

## 2013-03-24 ENCOUNTER — Encounter: Payer: Self-pay | Admitting: Vascular Surgery

## 2013-03-24 VITALS — BP 110/69 | HR 94 | Resp 20 | Ht 66.5 in | Wt 179.4 lb

## 2013-03-24 DIAGNOSIS — Z48812 Encounter for surgical aftercare following surgery on the circulatory system: Secondary | ICD-10-CM | POA: Insufficient documentation

## 2013-03-24 DIAGNOSIS — I714 Abdominal aortic aneurysm, without rupture, unspecified: Secondary | ICD-10-CM | POA: Insufficient documentation

## 2013-03-24 LAB — BUN: BUN: 18 mg/dL (ref 6–23)

## 2013-03-24 MED ORDER — IOHEXOL 350 MG/ML SOLN
80.0000 mL | Freq: Once | INTRAVENOUS | Status: AC | PRN
  Administered 2013-03-24: 80 mL via INTRAVENOUS

## 2013-03-24 NOTE — Addendum Note (Signed)
Addended by: Adria Dill L on: 03/24/2013 04:59 PM   Modules accepted: Orders

## 2013-03-24 NOTE — Progress Notes (Signed)
The patient presents today for followup of stent graft repair of infrarenal abdominal aortic aneurysm in October of 2013. He has severe shortness of breath in our office today but reports this is actually improved over the last several months. He does have known severe COPD and pulmonary fibrosis. He has no symptoms referable to his aneurysm.  Past Medical History  Diagnosis Date  . CAD (coronary artery disease)     s/p CABG; s/p redo CABG in 1997 and 2007; cath 4/10: LM occluded, CFX occluded, old S-RCA occluded, old S-RCA 90%, left radial to RCA ok, S-CFX ok, L-LAD ok, 2 old SVGs stumped  . Hypertension   . Hyperlipidemia   . Ischemic cardiomyopathy     echo 7/12: EF 45-50%, severe LVH, mild LAE; EF 50% per echo 2/13  . Chronic systolic heart failure     EF is 24% in the past; EF 45-50% in 7/12 and up to 50% in Feb 2013  . Carotid stenosis     doppler 11/11: 20-39% bilat.  . ICD (implantable cardiac defibrillator) in place 2010  . Hypertensive heart disease   . COPD (chronic obstructive pulmonary disease)   . History of stroke   . Hiatal hernia   . AAA (abdominal aortic aneurysm)     followed by [facility]  . Pulmonary fibrosis     followed by [name]; on chronic oxygen.   . Prolonged grief reaction   . Diabetes mellitus, insulin dependent (IDDM), controlled   . CHF (congestive heart failure)   . Myocardial infarction   . Stroke   . Shortness of breath     on home O2  . Rectal bleed February 2014    History  Substance Use Topics  . Smoking status: Former Smoker -- 1.00 packs/day for 47 years    Types: Cigarettes    Quit date: 03/12/2003  . Smokeless tobacco: Never Used  . Alcohol Use: No    Family History  Problem Relation Age of Onset  . Heart attack Brother   . Coronary artery disease Brother   . Coronary artery disease Brother   . Heart disease Brother   . Sudden death Father   . Sudden death Mother     Allergies  Allergen Reactions  . Sulfa Antibiotics Nausea  And Vomiting    This occurred when pt was 77 years old     Current outpatient prescriptions:acetaminophen (TYLENOL) 500 MG tablet, Take 500-1,000 mg by mouth every 4 (four) hours as needed for pain., Disp: , Rfl: ;  albuterol (PROVENTIL) (2.5 MG/3ML) 0.083% nebulizer solution, Take 3 mLs (2.5 mg total) by nebulization 4 (four) times daily. And as needed, Disp: 270 mL, Rfl: 6;  aspirin 81 MG tablet, Take 81 mg by mouth daily.  , Disp: , Rfl:  atorvastatin (LIPITOR) 40 MG tablet, Take 1 tablet (40 mg total) by mouth daily., Disp: 30 tablet, Rfl: 11;  clopidogrel (PLAVIX) 75 MG tablet, TAKE 1 TABLET ONCE DAILY., Disp: 30 tablet, Rfl: 6;  docusate sodium (COLACE) 100 MG capsule, Take 100-200 mg by mouth 2 (two) times daily., Disp: , Rfl: ;  FLUoxetine (PROZAC) 40 MG capsule, Take 40 mg by mouth daily., Disp: , Rfl:  furosemide (LASIX) 40 MG tablet, Take 1 tablet by mouth daily., Disp: , Rfl: ;  insulin detemir (LEVEMIR) 100 UNIT/ML injection, Inject 23 Units into the skin daily., Disp: , Rfl: ;  Insulin Pen Needle (PEN NEEDLES 31GX5/16") 31G X 8 MM MISC, , Disp: , Rfl: ;  ipratropium (ATROVENT) 0.02 % nebulizer solution, Take 2.5 mLs (500 mcg total) by nebulization 4 (four) times daily., Disp: 270 mL, Rfl: 6 losartan (COZAAR) 25 MG tablet, Take 25 mg by mouth daily. , Disp: , Rfl: ;  morphine 20 MG/5ML solution, Take 0.6 mLs (2.4 mg total) by mouth every 4 (four) hours as needed for pain., Disp: 100 mL, Rfl: 0;  Multiple Vitamin (MULTIVITAMIN WITH MINERALS) TABS, Take 1 tablet by mouth daily., Disp: , Rfl: ;  nateglinide (STARLIX) 60 MG tablet, Take 60 mg by mouth daily as needed (when eating heavy meals). , Disp: , Rfl:  nebivolol (BYSTOLIC) 2.5 MG tablet, Take 2.5 mg by mouth daily., Disp: , Rfl: ;  NON FORMULARY, 8L 24/7, Disp: , Rfl: ;  pantoprazole (PROTONIX) 40 MG tablet, Take 1 tablet (40 mg total) by mouth 2 (two) times daily., Disp: 60 tablet, Rfl: 5;  predniSONE (DELTASONE) 10 MG tablet, Take 20 mg by  mouth daily., Disp: , Rfl: ;  ZETIA 10 MG tablet, Take 1 tablet by mouth daily., Disp: , Rfl:   BP 110/69  Pulse 94  Resp 20  Ht 5' 6.5" (1.689 m)  Wt 179 lb 6.4 oz (81.375 kg)  BMI 28.53 kg/m2  Body mass index is 28.53 kg/(m^2).       Physical exam well-developed gentleman with shortness of breath with any exertion. Radial and femoral pulses 2+ bilaterally Abdomen soft nontender well-healed midline incision from prior hernia repair no pulsatile mass noted Neurologically he is grossly intact Respirations with shortness of breath ligating onto the exam table.  CT scan today he was reviewed with the patient. This shows excellent positioning of the stent graft with no evidence of endoleak. Maximal diameter of his aneurysm sac is 5.8 down from maximum of 6.0 preoperatively.  Impression and plan: Stable stent graft followup of infrarenal aneurysm. We'll see him again in one year with repeat CT scan followup

## 2013-03-31 ENCOUNTER — Other Ambulatory Visit: Payer: Self-pay | Admitting: Internal Medicine

## 2013-04-06 ENCOUNTER — Encounter: Payer: Medicare Other | Admitting: *Deleted

## 2013-04-07 ENCOUNTER — Other Ambulatory Visit: Payer: Self-pay | Admitting: Nurse Practitioner

## 2013-04-17 ENCOUNTER — Encounter: Payer: Self-pay | Admitting: *Deleted

## 2013-04-24 ENCOUNTER — Telehealth: Payer: Self-pay | Admitting: Critical Care Medicine

## 2013-04-24 NOTE — Telephone Encounter (Signed)
Please advise also regarding the low dose xanax as well thanks

## 2013-04-24 NOTE — Telephone Encounter (Signed)
Ok for xanax.

## 2013-04-24 NOTE — Telephone Encounter (Signed)
Prednisone will not help but no harm trying . So give Please take Take prednisone 40mg  once daily x 3 days, then 30mg  once daily x 3 days, then 20mg  once daily x 3 days, then prednisone 10mg  once daily  x 3 days and stop

## 2013-04-24 NOTE — Telephone Encounter (Signed)
I spoke with [facility] nurse. She stated pt power went out on wed for few minutes and since he has noticed increase SOB. Pt uses 8 liters cont O2. When pt is sitting still his sats are 90-92% on 8 liters and is normally higher. If pt walks from the restroom to the living room he drops in the 70's on 8 liters. Per pt son he is having to use the liquid morphine TID now. Toniann Fail listened to pt and does not hear any wheezing, no congestion and is not having an leg swelling. Pt is taking pred 20 mg daily. She is wanting to know if we can increase this to  Help pt. Also she is wanting RX for xanax 0.25 mg as pt has some anxiety and panics which he becomes more SOB. Pt has pending appt 04/27/13. Since PW is scheduled off will forward to doc of day MR. Please advise thanks  Allergies  Allergen Reactions  . Sulfa Antibiotics Nausea And Vomiting    This occurred when pt was 77 years old

## 2013-04-24 NOTE — Telephone Encounter (Signed)
Called and spoke with wendy and she is aware of MR orders for the prednisone dose taper.  She will get this called in for the pt and will placed him back on his maintenance dose of the pred once completed with the taper.  She is aware ok for the low dose xanax 0.25 mg   1 po every 6 hours as needed for anxiety.  Toniann Fail will have these meds sent to the pharmacy.  Nothing further is needed.

## 2013-04-27 ENCOUNTER — Encounter: Payer: Self-pay | Admitting: Critical Care Medicine

## 2013-04-27 ENCOUNTER — Ambulatory Visit (INDEPENDENT_AMBULATORY_CARE_PROVIDER_SITE_OTHER): Payer: Medicare Other | Admitting: Critical Care Medicine

## 2013-04-27 VITALS — BP 112/70 | HR 76 | Temp 97.5°F | Ht 67.0 in | Wt 184.6 lb

## 2013-04-27 DIAGNOSIS — R0902 Hypoxemia: Secondary | ICD-10-CM

## 2013-04-27 DIAGNOSIS — J8410 Pulmonary fibrosis, unspecified: Secondary | ICD-10-CM

## 2013-04-27 DIAGNOSIS — J9610 Chronic respiratory failure, unspecified whether with hypoxia or hypercapnia: Secondary | ICD-10-CM

## 2013-04-27 DIAGNOSIS — J9611 Chronic respiratory failure with hypoxia: Secondary | ICD-10-CM

## 2013-04-27 NOTE — Progress Notes (Signed)
Subjective:    Patient ID: [name], male    DOB: 14-Sep-1936, 77 y.o.   MRN: [id number]  HPI  77 y.o.WM   04/27/2013 Chief Complaint  Patient presents with  . 2 month follow up    o2 sat 77% on 8l cont upon arrival to exam room.  Breathing has worsened with exertion - has increased SOB with less activity and chest congestion.  No cough.    Dyspnea is worsening.  sats low on arrival.  No real chest pain. No edema in feet.  Has a hospital bed but wont use it.   Past Medical History  Diagnosis Date  . CAD (coronary artery disease)     s/p CABG; s/p redo CABG in 1997 and 2007; cath 4/10: LM occluded, CFX occluded, old S-RCA occluded, old S-RCA 90%, left radial to RCA ok, S-CFX ok, L-LAD ok, 2 old SVGs stumped  . Hypertension   . Hyperlipidemia   . Ischemic cardiomyopathy     echo 7/12: EF 45-50%, severe LVH, mild LAE; EF 50% per echo 2/13  . Chronic systolic heart failure     EF is 24% in the past; EF 45-50% in 7/12 and up to 50% in Feb 2013  . Carotid stenosis     doppler 11/11: 20-39% bilat.  . ICD (implantable cardiac defibrillator) in place 2010  . Hypertensive heart disease   . COPD (chronic obstructive pulmonary disease)   . History of stroke   . Hiatal hernia   . AAA (abdominal aortic aneurysm)     followed by [facility]  . Pulmonary fibrosis     followed by [name]; on chronic oxygen.   . Prolonged grief reaction   . Diabetes mellitus, insulin dependent (IDDM), controlled   . CHF (congestive heart failure)   . Myocardial infarction   . Stroke   . Shortness of breath     on home O2  . Rectal bleed February 2014     Family History  Problem Relation Age of Onset  . Heart attack Brother   . Coronary artery disease Brother   . Coronary artery disease Brother   . Heart disease Brother   . Sudden death Father   . Sudden death Mother      History   Social History  . Marital Status: Widowed    Spouse Name: N/A    Number of Children: N/A  . Years of  Education: N/A   Occupational History  . Not on file.   Social History Main Topics  . Smoking status: Former Smoker -- 1.00 packs/day for 47 years    Types: Cigarettes    Quit date: 03/12/2003  . Smokeless tobacco: Never Used  . Alcohol Use: No  . Drug Use: No  . Sexually Active: No   Other Topics Concern  . Not on file   Social History Narrative  . No narrative on file     Allergies  Allergen Reactions  . Sulfa Antibiotics Nausea And Vomiting    This occurred when pt was 77 years old      Outpatient Prescriptions Prior to Visit  Medication Sig Dispense Refill  . acetaminophen (TYLENOL) 500 MG tablet Take 500-1,000 mg by mouth every 4 (four) hours as needed for pain.      Marland Kitchen albuterol (PROVENTIL) (2.5 MG/3ML) 0.083% nebulizer solution Take 3 mLs (2.5 mg total) by nebulization 4 (four) times daily. And as needed  270 mL  6  . aspirin 81 MG tablet Take  81 mg by mouth daily.        Marland Kitchen atorvastatin (LIPITOR) 40 MG tablet TAKE 1 TABLET ONCE DAILY.  30 tablet  11  . clopidogrel (PLAVIX) 75 MG tablet TAKE 1 TABLET ONCE DAILY.  30 tablet  6  . docusate sodium (COLACE) 100 MG capsule Take 100-200 mg by mouth 2 (two) times daily.      Marland Kitchen FLUoxetine (PROZAC) 40 MG capsule Take 40 mg by mouth daily.      . furosemide (LASIX) 40 MG tablet Take 1 tablet by mouth daily.      . insulin detemir (LEVEMIR) 100 UNIT/ML injection Inject 23 Units into the skin daily.      . Insulin Pen Needle (PEN NEEDLES 31GX5/16") 31G X 8 MM MISC       . ipratropium (ATROVENT) 0.02 % nebulizer solution Take 2.5 mLs (500 mcg total) by nebulization 4 (four) times daily.  270 mL  6  . losartan (COZAAR) 25 MG tablet Take 25 mg by mouth daily.       Marland Kitchen morphine 20 MG/5ML solution Take 0.6 mLs (2.4 mg total) by mouth every 4 (four) hours as needed for pain.  100 mL  0  . Multiple Vitamin (MULTIVITAMIN WITH MINERALS) TABS Take 1 tablet by mouth daily.      . nateglinide (STARLIX) 60 MG tablet Take 60 mg by mouth daily as  needed (when eating heavy meals).       . NON FORMULARY 8L 24/7      . pantoprazole (PROTONIX) 40 MG tablet Take 1 tablet (40 mg total) by mouth 2 (two) times daily.  60 tablet  5  . predniSONE (DELTASONE) 10 MG tablet Take by mouth daily. Tapering as directed      . nebivolol (BYSTOLIC) 2.5 MG tablet Take 2.5 mg by mouth daily.      Marland Kitchen ZETIA 10 MG tablet Take 1 tablet by mouth daily.      Marland Kitchen ZETIA 10 MG tablet TAKE 1 TABLET ONCE DAILY.  30 tablet  6   No facility-administered medications prior to visit.      Review of Systems  Constitutional:   No  weight loss, night sweats,  Fevers, chills, fatigue, lassitude. HEENT:   No headaches,  Difficulty swallowing,  Tooth/dental problems,  Sore throat,                No sneezing, itching, ear ache, nasal congestion, post nasal drip,   CV:  No chest pain,  Orthopnea, PND, swelling in lower extremities, anasarca, dizziness, palpitations  GI  No heartburn, indigestion, abdominal pain, nausea, vomiting, diarrhea, change in bowel habits, loss of appetite  Resp: Notes  shortness of breath with exertion and   at rest.  No excess mucus, no productive cough,  No non-productive cough,  No coughing up of blood.  No change in color of mucus.  No wheezing.  No chest wall deformity  Skin: no rash or lesions.  GU: no dysuria, change in color of urine, no urgency or frequency.  No flank pain.  MS:  No joint pain or swelling.  No decreased range of motion.  No back pain.  Psych:  No change in mood or affect. No depression or anxiety.  No memory loss.     Objective:   Physical Exam  Filed Vitals:   04/27/13 1402 04/27/13 1408 04/27/13 1411  BP:  112/70   Pulse:  76   Temp:  97.5 F (36.4 C)   TempSrc:  Oral   Height:  5\' 7"  (1.702 m)   Weight:  184 lb 9.6 oz (83.734 kg)   SpO2: 77% 83% 86%    Gen:  in no distress,  normal affect  ENT: No lesions,  mouth clear,  oropharynx clear, no postnasal drip  Neck: No JVD, no TMG, no carotid  bruits  Lungs: No use of accessory muscles, no dullness to percussion, distant bs , dry rales at bases  Cardiovascular: RRR, heart sounds normal, no murmur or gallops, no peripheral edema  Abdomen: soft and NT, no HSM,  BS normal  Musculoskeletal: No deformities, no cyanosis or clubbing  Neuro: alert, non focal  Skin: Warm, no lesions or rashes        Assessment & Plan:   Chronic respiratory failure with hypoxia Chronic respiratory failure on the basis of hypoxemia  Chronic respiratory failure Chronic respiratory failure on the basis of hypoxemia do to end-stage interstitial lung disease and emphysematous chronic obstructive airways disease The patient's now in hospice and we have recent limits of what standard medical care can offer Plan Maintain home hospice care Increase use of morphine for symptom management Maintain oxygen at 8-10 L continuous  Pulmonary fibrosis Progressive interstitial fibrosis on the basis of usual interstitial pneumonitis No benefit here for pulse prednisone   Note a flu vaccination was given on this visit 04/28/2013  Updated Medication List Outpatient Encounter Prescriptions as of 04/27/2013  Medication Sig Dispense Refill  . acetaminophen (TYLENOL) 500 MG tablet Take 500-1,000 mg by mouth every 4 (four) hours as needed for pain.      Marland Kitchen albuterol (PROVENTIL) (2.5 MG/3ML) 0.083% nebulizer solution Take 3 mLs (2.5 mg total) by nebulization 4 (four) times daily. And as needed  270 mL  6  . ALPRAZolam (XANAX) 0.25 MG tablet Take 0.25 mg by mouth 2 (two) times daily as needed for sleep.      Marland Kitchen aspirin 81 MG tablet Take 81 mg by mouth daily.        Marland Kitchen atorvastatin (LIPITOR) 40 MG tablet TAKE 1 TABLET ONCE DAILY.  30 tablet  11  . clopidogrel (PLAVIX) 75 MG tablet TAKE 1 TABLET ONCE DAILY.  30 tablet  6  . docusate sodium (COLACE) 100 MG capsule Take 100-200 mg by mouth 2 (two) times daily.      Marland Kitchen FLUoxetine (PROZAC) 40 MG capsule Take 40 mg by mouth  daily.      . furosemide (LASIX) 40 MG tablet Take 1 tablet by mouth daily.      . insulin detemir (LEVEMIR) 100 UNIT/ML injection Inject 23 Units into the skin daily.      . Insulin Pen Needle (PEN NEEDLES 31GX5/16") 31G X 8 MM MISC       . ipratropium (ATROVENT) 0.02 % nebulizer solution Take 2.5 mLs (500 mcg total) by nebulization 4 (four) times daily.  270 mL  6  . losartan (COZAAR) 25 MG tablet Take 25 mg by mouth daily.       Marland Kitchen morphine 20 MG/5ML solution Take 0.6 mLs (2.4 mg total) by mouth every 4 (four) hours as needed for pain.  100 mL  0  . Multiple Vitamin (MULTIVITAMIN WITH MINERALS) TABS Take 1 tablet by mouth daily.      . nateglinide (STARLIX) 60 MG tablet Take 60 mg by mouth daily as needed (when eating heavy meals).       . NON FORMULARY 8L 24/7      . pantoprazole (PROTONIX) 40 MG  tablet Take 1 tablet (40 mg total) by mouth 2 (two) times daily.  60 tablet  5  . predniSONE (DELTASONE) 10 MG tablet Take by mouth daily. Tapering as directed      . [DISCONTINUED] nebivolol (BYSTOLIC) 2.5 MG tablet Take 2.5 mg by mouth daily.      . [DISCONTINUED] ZETIA 10 MG tablet Take 1 tablet by mouth daily.      . [DISCONTINUED] ZETIA 10 MG tablet TAKE 1 TABLET ONCE DAILY.  30 tablet  6   No facility-administered encounter medications on file as of 04/27/2013.

## 2013-04-27 NOTE — Patient Instructions (Addendum)
We will care for you by phone via Hospice RN No change in medications

## 2013-04-28 NOTE — Assessment & Plan Note (Signed)
Chronic respiratory failure on the basis of hypoxemia

## 2013-04-28 NOTE — Assessment & Plan Note (Signed)
Progressive interstitial fibrosis on the basis of usual interstitial pneumonitis No benefit here for pulse prednisone

## 2013-04-28 NOTE — Assessment & Plan Note (Signed)
Chronic respiratory failure on the basis of hypoxemia do to end-stage interstitial lung disease and emphysematous chronic obstructive airways disease The patient's now in hospice and we have recent limits of what standard medical care can offer Plan Maintain home hospice care Increase use of morphine for symptom management Maintain oxygen at 8-10 L continuous

## 2013-04-30 ENCOUNTER — Telehealth: Payer: Self-pay | Admitting: Internal Medicine

## 2013-04-30 NOTE — Telephone Encounter (Signed)
Follow up     Patient wants a magnet put in the home. He not ready to have his de fib deactivate would like to speak with his family first.

## 2013-04-30 NOTE — Telephone Encounter (Signed)
New Prob      Nurse would like to speak to nurse regarding pts current status. Please call.

## 2013-04-30 NOTE — Telephone Encounter (Signed)
Routing message to Dr. Johney Frame and his nurse Dennis Bast, RN and to [name] and his nurse Deliah Goody, RN so that all are aware.

## 2013-04-30 NOTE — Telephone Encounter (Signed)
Spoke with Toniann Fail, hospice nurse who states patient is in end-stage pulmonary fibrosis and is being managed by 24 hour hospice care at his home and has a [employer] AICD. Toniann Fail called to inquire about how patient's AICD would be managed at the end of patient's life.  I advised Toniann Fail that [name] was out of the office and that I would talk with Dr. Johney Frame, one of Dr. Odessa Fleming partners about how to manage the device. Dr. Johney Frame advised that he is willing to sign an order for hospice to turn off the device if and when the patient is ready.  Dennis Bast, RN, Dr. Jenel Lucks nurse talked with Leta Jungling, the [employer] rep who stated that she would go to the patient's home to turn off the device when called by hospice.  Marcia's number is [phone]. I advised Toniann Fail of Dr. Jenel Lucks advice.  Toniann Fail will talk with patient and let us know his wishes.

## 2013-05-01 NOTE — Telephone Encounter (Signed)
Follow Up   Hospice would like to speak to nurse regarding a possible magnet being put in pts home. Please call.

## 2013-05-01 NOTE — Telephone Encounter (Signed)
Spoke with [name], okay given for magnet to be placed in the home.

## 2013-05-06 ENCOUNTER — Other Ambulatory Visit: Payer: Self-pay

## 2013-05-11 ENCOUNTER — Other Ambulatory Visit: Payer: Self-pay | Admitting: Nurse Practitioner

## 2013-06-09 ENCOUNTER — Ambulatory Visit (INDEPENDENT_AMBULATORY_CARE_PROVIDER_SITE_OTHER): Payer: Medicare Other | Admitting: Nurse Practitioner

## 2013-06-09 ENCOUNTER — Encounter: Payer: Self-pay | Admitting: Nurse Practitioner

## 2013-06-09 VITALS — BP 140/100 | HR 86 | Ht 66.5 in | Wt 183.4 lb

## 2013-06-09 DIAGNOSIS — I5042 Chronic combined systolic (congestive) and diastolic (congestive) heart failure: Secondary | ICD-10-CM

## 2013-06-09 LAB — ICD DEVICE OBSERVATION
AL AMPLITUDE: 2.5 mv
ATRIAL PACING ICD: 100 pct
DEV-0020ICD: NEGATIVE
RV LEAD IMPEDENCE ICD: 532 Ohm
RV LEAD THRESHOLD: 1 V
TZAT-0001ATACH: 3
TZAT-0001FASTVT: 1
TZAT-0001SLOWVT: 1
TZAT-0002ATACH: NEGATIVE
TZAT-0002ATACH: NEGATIVE
TZAT-0002FASTVT: NEGATIVE
TZAT-0011SLOWVT: 10 ms
TZAT-0011SLOWVT: 10 ms
TZAT-0012FASTVT: 200 ms
TZAT-0018ATACH: NEGATIVE
TZAT-0018SLOWVT: NEGATIVE
TZAT-0018SLOWVT: NEGATIVE
TZAT-0019ATACH: 6 V
TZAT-0019ATACH: 6 V
TZAT-0019SLOWVT: 8 V
TZAT-0019SLOWVT: 8 V
TZAT-0020ATACH: 1.5 ms
TZAT-0020ATACH: 1.5 ms
TZAT-0020SLOWVT: 1.5 ms
TZAT-0020SLOWVT: 1.5 ms
TZON-0003VSLOWVT: 400 ms
TZON-0005SLOWVT: 12
TZST-0001ATACH: 5
TZST-0001ATACH: 6
TZST-0001FASTVT: 3
TZST-0001FASTVT: 4
TZST-0001FASTVT: 5
TZST-0001SLOWVT: 4
TZST-0001SLOWVT: 5
TZST-0002ATACH: NEGATIVE
TZST-0002ATACH: NEGATIVE
TZST-0002FASTVT: NEGATIVE
TZST-0002FASTVT: NEGATIVE
TZST-0002FASTVT: NEGATIVE
TZST-0003SLOWVT: 35 J
TZST-0003SLOWVT: 35 J
VENTRICULAR PACING ICD: 0.1 pct

## 2013-06-09 NOTE — Patient Instructions (Signed)
I will be available if you need anything.   Have Hospice call me at the [facility] office at [phone] if you have any questions, problems or concerns.

## 2013-06-09 NOTE — Progress Notes (Signed)
[name] Date of Birth: 1935/11/09 Medical Record #[id number]  History of Present Illness: [name] is seen back today for a follow up visit. Seen for [name]. He has an ischemic CM. Had CABG on 3 separate occasions. Has an ICD in place. EF is now up to 50% per echo in February of 2013. Other issues include GERD, pulmonary fibrosis (severe), depression, diabetes, and known AAA with prior endovascular stent repair in 2013. He is oxygen dependent. He has been prone to hypotension which has resulted in physical injury in the past.   He was seen by [name] in 12/2012. He had some volume overload and his Lasix was adjusted. He was then admitted to [facility] in 12/2012 with acute on chronic hypoxic respiratory failure secondary to pulmonary fibrosis and COPD as well as malfunctioning oxygen unit. The patient was volume depleted and was hydrated. He did have a mild elevation in his cardiac enzymes. He was seen by cardiology. This was felt to be related to demand ischemia and no further workup was pursued. He did have worsening renal function and this improved with hydration. He was referred to hospice for his end-stage pulmonary fibrosis. Echo 4/14: EF 45-50%, posterior HK, grade 1 diastolic dysfunction, trivial MR, mild to moderate LE, mild to moderately reduced RV SF, mild to moderate RAE.  Comes back today. Here with his stepson. He remains on Hospice services with end stage pulmonary fibrosis his main issue. Several medicine changes since I last saw him. Dr. Lorin Picket is managing his Hospice. Notes show that he is not ready to have his ICD turned off. Does have a magnet in the house. Using up to 15 liters of oxgyen with activities.   Tells me he is here to say good bye. He would like his ICD turned off now. Too hard to get here in regards to transportation. He is fairly comfortable.    Current Outpatient Prescriptions  Medication Sig Dispense Refill  . acetaminophen (TYLENOL) 500 MG  tablet Take 500-1,000 mg by mouth every 4 (four) hours as needed for pain.      Marland Kitchen albuterol (PROVENTIL) (2.5 MG/3ML) 0.083% nebulizer solution Take 3 mLs (2.5 mg total) by nebulization 4 (four) times daily. And as needed  270 mL  6  . ALPRAZolam (XANAX) 0.25 MG tablet Take 0.25 mg by mouth 2 (two) times daily as needed for sleep.      Marland Kitchen aspirin 81 MG tablet Take 81 mg by mouth daily.        Marland Kitchen atorvastatin (LIPITOR) 40 MG tablet TAKE 1 TABLET ONCE DAILY.  30 tablet  11  . clopidogrel (PLAVIX) 75 MG tablet TAKE 1 TABLET ONCE DAILY.  30 tablet  6  . docusate sodium (COLACE) 100 MG capsule Take 100-200 mg by mouth 2 (two) times daily.      Marland Kitchen FLUoxetine (PROZAC) 40 MG capsule Take 40 mg by mouth daily.      . furosemide (LASIX) 40 MG tablet Take 1 tablet by mouth daily.      . insulin detemir (LEVEMIR) 100 UNIT/ML injection Inject 23 Units into the skin daily.      . Insulin Pen Needle (PEN NEEDLES 31GX5/16") 31G X 8 MM MISC       . ipratropium (ATROVENT) 0.02 % nebulizer solution Take 2.5 mLs (500 mcg total) by nebulization 4 (four) times daily.  270 mL  6  . losartan (COZAAR) 25 MG tablet Take 25 mg by mouth daily.       Marland Kitchen  morphine 20 MG/5ML solution Take 0.6 mLs (2.4 mg total) by mouth every 4 (four) hours as needed for pain.  100 mL  0  . Multiple Vitamin (MULTIVITAMIN WITH MINERALS) TABS Take 1 tablet by mouth daily.      . nateglinide (STARLIX) 60 MG tablet Take 60 mg by mouth daily as needed (when eating heavy meals).       . NON FORMULARY 8L 24/7      . OXYGEN-HELIUM IN Inhale into the lungs daily. 8 to 15 liters      . pantoprazole (PROTONIX) 40 MG tablet Take 1 tablet (40 mg total) by mouth 2 (two) times daily.  60 tablet  5  . predniSONE (DELTASONE) 10 MG tablet Take by mouth daily. Tapering as directed       No current facility-administered medications for this visit.    Allergies  Allergen Reactions  . Sulfa Antibiotics Nausea And Vomiting    This occurred when pt was 77 years old      Past Medical History  Diagnosis Date  . CAD (coronary artery disease)     s/p CABG; s/p redo CABG in 1997 and 2007; cath 4/10: LM occluded, CFX occluded, old S-RCA occluded, old S-RCA 90%, left radial to RCA ok, S-CFX ok, L-LAD ok, 2 old SVGs stumped  . Hypertension   . Hyperlipidemia   . Ischemic cardiomyopathy     echo 7/12: EF 45-50%, severe LVH, mild LAE; EF 50% per echo 2/13  . Chronic systolic heart failure     EF is 24% in the past; EF 45-50% in 7/12 and up to 50% in Feb 2013  . Carotid stenosis     doppler 11/11: 20-39% bilat.  . ICD (implantable cardiac defibrillator) in place 2010  . Hypertensive heart disease   . COPD (chronic obstructive pulmonary disease)   . History of stroke   . Hiatal hernia   . AAA (abdominal aortic aneurysm)     followed by [facility]  . Pulmonary fibrosis     followed by [name]; on chronic oxygen.   . Prolonged grief reaction   . Diabetes mellitus, insulin dependent (IDDM), controlled   . CHF (congestive heart failure)   . Myocardial infarction   . Stroke   . Shortness of breath     on home O2  . Rectal bleed February 2014    Past Surgical History  Procedure Laterality Date  . Coronary artery bypass graft  2007    redo CABG x2 -- at that time had left radial artery graft to the distal right coronary and saphenous vein graft to the distal circumflex.   . Coronary artery bypass graft  1997    redo surgery x2 --  in January 18, 1996, and at that time had a vein graft to the diagonal and OM and a vein graft to the posterior descending and  posterolateral branches   . Coronary artery bypass graft  1992    x2 -- left internal mammary to the LAD, a single vein graft to the diagonal - obtuse marginal system  and posterior descending  . Insert / replace / remove pacemaker  01/29/2009    Medtronic Virtuoso II DR four chamber AICD  . Cardiac catheterization  01/07/2009     performed by Dr. Deborah Chalk on January 07, 2009 revealing medically manageable  coronary artery disease with reduced EF at 30%   . Hernia repair  1998  . Prostate test  June 2013  . Exploration post  operative open heart  2007  . Abdominal aortic aneurysm repair  07/16/2012    History  Smoking status  . Former Smoker -- 1.00 packs/day for 47 years  . Types: Cigarettes  . Quit date: 03/12/2003  Smokeless tobacco  . Never Used    History  Alcohol Use No    Family History  Problem Relation Age of Onset  . Heart attack Brother   . Coronary artery disease Brother   . Coronary artery disease Brother   . Heart disease Brother   . Sudden death Father   . Sudden death Mother     Review of Systems: The review of systems is per the HPI.  All other systems were reviewed and are negative.  Physical Exam: BP 140/100  Pulse 86  Ht 5' 6.5" (1.689 m)  Wt 183 lb 6.4 oz (83.19 kg)  BMI 29.16 kg/m2  SpO2 100% Patient is very pleasant and in no acute distress. Looks chronically ill. Has oxygen via face mask in place. Skin is warm and dry. Color is normal.  HEENT is unremarkable. Normocephalic/atraumatic. PERRL. Sclera are nonicteric. Neck is supple. No masses. No JVD. Lungs with scattered rales. Cardiac exam shows a slightly irregular rhythm. Abdomen is soft. Extremities are with trace edema. Gait and ROM are intact. No gross neurologic deficits noted.  LABORATORY DATA:  Lab Results  Component Value Date   WBC 6.7 01/18/2013   HGB 10.1* 01/18/2013   HCT 30.7* 01/18/2013   PLT 131* 01/18/2013   GLUCOSE 112* 01/19/2013   CHOL 143 03/12/2011   TRIG 148.0 03/12/2011   HDL 34.00* 03/12/2011   LDLCALC 79 03/12/2011   ALT 18 01/16/2013   AST 27 01/16/2013   NA 142 01/19/2013   K 3.9 01/19/2013   CL 107 01/19/2013   CREATININE 1.40* 03/24/2013   BUN 18 03/24/2013   CO2 27 01/19/2013   TSH 2.255 01/14/2013   INR 1.38 07/16/2012   HGBA1C 7.0* 01/14/2013     Assessment / Plan:  1. CAD - no chest pain - we have moved to conservative measures.   2. Chronic combined systolic  and diastolic HF - on Hospice services - his device is programmed off today per his wishes. Hospice will be assuming his care. It has been a pleasure taking care of him all these years.   3. End stage pulmonary fibrosis - on Hospice services.  Patient is agreeable to this plan and will call if any problems develop in the interim.   Rosalio Macadamia, RN, ANP-C [facility] [address] [place], [place]  [address]

## 2013-06-10 ENCOUNTER — Telehealth: Payer: Self-pay | Admitting: Critical Care Medicine

## 2013-06-10 DIAGNOSIS — J8410 Pulmonary fibrosis, unspecified: Secondary | ICD-10-CM

## 2013-06-10 DIAGNOSIS — J9610 Chronic respiratory failure, unspecified whether with hypoxia or hypercapnia: Secondary | ICD-10-CM

## 2013-06-10 NOTE — Telephone Encounter (Signed)
Get second concentrator to allow 10L rest and 15L exertion with oximiser

## 2013-06-10 NOTE — Telephone Encounter (Signed)
I called and made Toniann Fail aware of recs. I have placed order to be sent to [facility]. Nothing further needed

## 2013-06-10 NOTE — Telephone Encounter (Signed)
I spoke with Toniann Fail. She stated she had orders for pt to use 8 liters at rest and 10 liters w/ activity. Pt grandson called Toniann Fail today and stated he is having to use 15 liters w/ activity-even just going to the restroom. He is going through several portable tanks. He has gone through 8 tanks w/ the past 2 days. Pt concentrator only goes up to 10 liters and the DME ([facility]) advised if he needs 15 liters then they can supply a second concentrator w/ a Y connector.   Pt has morphine in the home to use for SOB but does not want to use it. Per wendy when she last saw pt he was using 10 liters at rest and was 88-89% at rest. Please advise [name] thanks

## 2013-06-19 ENCOUNTER — Telehealth: Payer: Self-pay | Admitting: Internal Medicine

## 2013-06-19 NOTE — Telephone Encounter (Signed)
Toniann Fail states pt has increased edma, swelling around his eyes. Toniann Fail states he is on Lasix 40mg  daily... Toniann Fail would like the nurse to give her suggestions

## 2013-06-19 NOTE — Telephone Encounter (Signed)
I spoke with Toniann Fail and the pt has started to develop issues with swelling. The pt has some ankle edema, swelling around eyes and left arm is swollen from mid-arm down (related to pt's position).  The pt's daughter is currently in town and for the past two days she has given the pt 40mg  of lasix twice a day.  Per Norma Fredrickson NP we would recommend increasing lasix to 60mg  daily. Hospice will make change and continue to follow patient.

## 2013-06-24 ENCOUNTER — Telehealth: Payer: Self-pay | Admitting: Critical Care Medicine

## 2013-06-25 ENCOUNTER — Telehealth: Payer: Self-pay | Admitting: Internal Medicine

## 2013-06-25 NOTE — Telephone Encounter (Signed)
New problem   FYI   Called the on call service last night.    Pt passed away @ 11:23 a.m.

## 2013-07-01 NOTE — Telephone Encounter (Signed)
Will forward to [name] as an Lorain Childes. The hospice nurse stated they really appreciate everything [name] has done for the family and so does the family.

## 2013-07-01 NOTE — Telephone Encounter (Signed)
Thanks for letting me know!

## 2013-07-01 DEATH — deceased

## 2013-07-09 ENCOUNTER — Encounter: Payer: Self-pay | Admitting: Internal Medicine

## 2013-08-06 ENCOUNTER — Other Ambulatory Visit: Payer: Self-pay

## 2014-01-28 ENCOUNTER — Encounter: Payer: Self-pay | Admitting: Internal Medicine

## 2014-01-28 ENCOUNTER — Telehealth: Payer: Self-pay | Admitting: Internal Medicine

## 2014-01-28 NOTE — Telephone Encounter (Addendum)
01-28-14 pt deceased 06-25-13 per hospice/mt

## 2014-03-16 ENCOUNTER — Other Ambulatory Visit: Payer: PRIVATE HEALTH INSURANCE

## 2014-03-16 ENCOUNTER — Ambulatory Visit: Payer: PRIVATE HEALTH INSURANCE | Admitting: Vascular Surgery

## 2014-03-23 ENCOUNTER — Ambulatory Visit: Payer: Medicare Other | Admitting: Vascular Surgery
